# Patient Record
Sex: Male | Born: 1950
Health system: Southern US, Community
[De-identification: ages and names within clinical notes are randomized; demographics above are authoritative.]

## PROBLEM LIST (undated history)

## (undated) DIAGNOSIS — K219 Gastro-esophageal reflux disease without esophagitis: Secondary | ICD-10-CM

## (undated) DIAGNOSIS — G629 Polyneuropathy, unspecified: Secondary | ICD-10-CM

## (undated) DIAGNOSIS — M109 Gout, unspecified: Secondary | ICD-10-CM

## (undated) DIAGNOSIS — C801 Malignant (primary) neoplasm, unspecified: Secondary | ICD-10-CM

## (undated) DIAGNOSIS — F329 Major depressive disorder, single episode, unspecified: Secondary | ICD-10-CM

## (undated) DIAGNOSIS — F32A Depression, unspecified: Secondary | ICD-10-CM

## (undated) DIAGNOSIS — J449 Chronic obstructive pulmonary disease, unspecified: Secondary | ICD-10-CM

## (undated) DIAGNOSIS — M549 Dorsalgia, unspecified: Secondary | ICD-10-CM

## (undated) DIAGNOSIS — H5440 Blindness, one eye, unspecified eye: Secondary | ICD-10-CM

## (undated) DIAGNOSIS — R0600 Dyspnea, unspecified: Secondary | ICD-10-CM

## (undated) DIAGNOSIS — E119 Type 2 diabetes mellitus without complications: Secondary | ICD-10-CM

## (undated) DIAGNOSIS — I10 Essential (primary) hypertension: Secondary | ICD-10-CM

## (undated) DIAGNOSIS — M1990 Unspecified osteoarthritis, unspecified site: Secondary | ICD-10-CM

## (undated) HISTORY — PX: LEG SURGERY: SHX1003

## (undated) HISTORY — PX: EYE SURGERY: SHX253

---

## 2005-02-27 ENCOUNTER — Observation Stay (HOSPITAL_COMMUNITY): Admission: RE | Admit: 2005-02-27 | Discharge: 2005-02-28 | Payer: Self-pay | Admitting: Neurosurgery

## 2015-03-11 ENCOUNTER — Encounter (HOSPITAL_COMMUNITY): Payer: Self-pay | Admitting: Cardiology

## 2015-03-11 ENCOUNTER — Emergency Department (HOSPITAL_COMMUNITY)
Admission: EM | Admit: 2015-03-11 | Discharge: 2015-03-11 | Disposition: A | Discharge status: Home / Self Care | Payer: Medicare Other | Attending: Emergency Medicine | Admitting: Emergency Medicine

## 2015-03-11 DIAGNOSIS — K611 Rectal abscess: Secondary | ICD-10-CM | POA: Insufficient documentation

## 2015-03-11 DIAGNOSIS — Z720 Tobacco use: Secondary | ICD-10-CM | POA: Diagnosis not present

## 2015-03-11 DIAGNOSIS — M1990 Unspecified osteoarthritis, unspecified site: Secondary | ICD-10-CM | POA: Diagnosis not present

## 2015-03-11 DIAGNOSIS — E119 Type 2 diabetes mellitus without complications: Secondary | ICD-10-CM | POA: Diagnosis not present

## 2015-03-11 DIAGNOSIS — Z79899 Other long term (current) drug therapy: Secondary | ICD-10-CM | POA: Diagnosis not present

## 2015-03-11 DIAGNOSIS — Z792 Long term (current) use of antibiotics: Secondary | ICD-10-CM | POA: Diagnosis not present

## 2015-03-11 HISTORY — DX: Dorsalgia, unspecified: M54.9

## 2015-03-11 HISTORY — DX: Unspecified osteoarthritis, unspecified site: M19.90

## 2015-03-11 HISTORY — DX: Gout, unspecified: M10.9

## 2015-03-11 HISTORY — DX: Type 2 diabetes mellitus without complications: E11.9

## 2015-03-11 LAB — CBC WITH DIFFERENTIAL/PLATELET
Basophils Absolute: 0 10*3/uL (ref 0.0–0.1)
Basophils Relative: 0 % (ref 0–1)
Eosinophils Absolute: 0 10*3/uL (ref 0.0–0.7)
Eosinophils Relative: 0 % (ref 0–5)
HCT: 42 % (ref 39.0–52.0)
Hemoglobin: 14.3 g/dL (ref 13.0–17.0)
Lymphocytes Relative: 7 % — ABNORMAL LOW (ref 12–46)
Lymphs Abs: 1.3 10*3/uL (ref 0.7–4.0)
MCH: 31.6 pg (ref 26.0–34.0)
MCHC: 34 g/dL (ref 30.0–36.0)
MCV: 92.7 fL (ref 78.0–100.0)
Monocytes Absolute: 0.8 10*3/uL (ref 0.1–1.0)
Monocytes Relative: 5 % (ref 3–12)
Neutro Abs: 15.5 10*3/uL — ABNORMAL HIGH (ref 1.7–7.7)
Neutrophils Relative %: 88 % — ABNORMAL HIGH (ref 43–77)
Platelets: 311 10*3/uL (ref 150–400)
RBC: 4.53 MIL/uL (ref 4.22–5.81)
RDW: 13 % (ref 11.5–15.5)
WBC: 17.7 10*3/uL — ABNORMAL HIGH (ref 4.0–10.5)

## 2015-03-11 LAB — BASIC METABOLIC PANEL
Anion gap: 10 (ref 5–15)
BUN: 9 mg/dL (ref 6–23)
CO2: 24 mmol/L (ref 19–32)
Calcium: 9.3 mg/dL (ref 8.4–10.5)
Chloride: 100 mmol/L (ref 96–112)
Creatinine, Ser: 0.81 mg/dL (ref 0.50–1.35)
GFR calc Af Amer: >90 mL/min (ref >90)
GFR calc non Af Amer: >90 mL/min (ref >90)
Glucose, Bld: 165 mg/dL — ABNORMAL HIGH (ref 70–99)
Potassium: 3.7 mmol/L (ref 3.5–5.1)
Sodium: 134 mmol/L — ABNORMAL LOW (ref 135–145)

## 2015-03-11 MED ORDER — LIDOCAINE-EPINEPHRINE (PF) 2 %-1:200000 IJ SOLN
INTRAMUSCULAR | Status: AC
  Administered 2015-03-11: 15:00:00
  Filled 2015-03-11: qty 20

## 2015-03-11 MED ORDER — POVIDONE-IODINE 10 % EX SOLN
CUTANEOUS | Status: AC
  Administered 2015-03-11: 15:00:00
  Filled 2015-03-11: qty 118

## 2015-03-11 MED ORDER — HYDROCODONE-ACETAMINOPHEN 5-325 MG PO TABS: 1.0000 | ORAL_TABLET | ORAL | Status: DC | PRN

## 2015-03-11 MED ORDER — METRONIDAZOLE 500 MG PO TABS: 500.0000 mg | ORAL_TABLET | Freq: Two times a day (BID) | ORAL | Status: DC

## 2015-03-11 MED ORDER — CIPROFLOXACIN HCL 500 MG PO TABS: 500.0000 mg | ORAL_TABLET | Freq: Two times a day (BID) | ORAL | Status: DC

## 2015-03-11 NOTE — ED Notes (Signed)
Abscess on bottom times one week.

## 2015-03-11 NOTE — Discharge Instructions (Signed)
It is important that you follow up with Dr. Arnoldo Morale at 10 am on Tuesday for further evaluation. Take the medication as directed. Do not take the narcotic if driving as it will make you sleepy.   Peri-Rectal Abscess Your caregiver has diagnosed you as having a peri-rectal abscess. This is an infected area near the rectum that is filled with pus. If the abscess is near the surface of the skin, your caregiver may open (incise) the area and drain the pus. HOME CARE INSTRUCTIONS   If your abscess was opened up and drained. A small piece of gauze may be placed in the opening so that it can drain. Do not remove the gauze unless directed by your caregiver.  A loose dressing may be placed over the abscess site. Change the dressing as often as necessary to keep it clean and dry.  After the drain is removed, the area may be washed with a gentle antiseptic (soap) four times per day.  A warm sitz bath, warm packs or heating pad may be used for pain relief, taking care not to burn yourself.  Return for a wound check in 1 day or as directed.  An "inflatable doughnut" may be used for sitting with added comfort. These can be purchased at a drugstore or medical supply house.  To reduce pain and straining with bowel movements, eat a high fiber diet with plenty of fruits and vegetables. Use stool softeners as recommended by your caregiver. This is especially important if narcotic type pain medications were prescribed as these may cause marked constipation.  Only take over-the-counter or prescription medicines for pain, discomfort, or fever as directed by your caregiver. SEEK IMMEDIATE MEDICAL CARE IF:   You have increasing pain that is not controlled by medication.  There is increased inflammation (redness), swelling, bleeding, or drainage from the area.  An oral temperature above 102 F (38.9 C) develops.  You develop chills or generalized malaise (feel lethargic or feel "washed out").  You develop any  new symptoms (problems) you feel may be related to your present problem. Document Released: 11/21/2000 Document Revised: 02/16/2012 Document Reviewed: 11/21/2008 [facility] Patient Information 2015 [place], [place]. This information is not intended to replace advice given to you by your health care provider. Make sure you discuss any questions you have with your health care provider.

## 2015-03-11 NOTE — ED Provider Notes (Signed)
CSN: [id number]     Arrival date & time 03/11/15  1138 History   This chart was scribed for non-physician practitioner Debroah Baller working with Nat Christen, MD by Hilda Lias, ED Scribe. This patient was seen in room APFT20/APFT20 and the patient's care was started at 12:57 PM.    Chief Complaint  Patient presents with  . Abscess      Patient is a 64 y.o. male presenting with abscess. The history is provided by the patient. No language interpreter was used.  Abscess Location:  Ano-genital Ano-genital abscess location:  Anus Abscess quality: fluctuance, painful, redness and warmth   Red streaking: no   Duration:  1 week Progression:  Worsening Pain details:    Quality:  Throbbing and burning   Severity:  Severe   Timing:  Constant   Progression:  Worsening Chronicity:  New Context: diabetes   Relieved by:  Nothing Worsened by:  Nothing tried Ineffective treatments:  NSAIDs, topical antibiotics, warm water soaks and oral antibiotics     HPI Comments: Lance [name] is a 64 y.o. male who presents to the Emergency Department complaining of a worsening abscess on his buttocks with associated redness and purulent drainage that has been present for a week and a half. Pt states that he had diarrhea a few weeks ago. He went to his PCP and was treated for viral gastroenteritis. Those symptoms resolved but then he noticed a red tender area on his buttocks near his rectum. There area has continued to get larger and more painful. He went back to his PCP and was treated with Penicillin and topical antibiotic. The area has gotten worse rather than better. Pt states he soaks it in warm tubs of water a few times per day with no relief. Pt notes he has not had a bowel movement in 4 days due to the pain.    Past Medical History  Diagnosis Date  . Gout   . Back pain   . Diabetes mellitus without complication   . Arthritis    Past Surgical History  Procedure Laterality Date  . Leg surgery    .  Eye surgery     History reviewed. No pertinent family history. History  Substance Use Topics  . Smoking status: Current Every Day Smoker  . Smokeless tobacco: Not on file  . Alcohol Use: Yes     Comment: occasional     Review of Systems  Skin:       Abscess near rectum.    All other systems negative   Allergies  Review of patient's allergies indicates no known allergies.  Home Medications   Prior to Admission medications   Medication Sig Start Date End Date Taking? Authorizing Provider  ciprofloxacin (CIPRO) 500 MG tablet Take 1 tablet (500 mg total) by mouth 2 (two) times daily. 03/11/15   Hope Bunnie Pion, NP  HYDROcodone-acetaminophen (NORCO/VICODIN) 5-325 MG per tablet Take 1 tablet by mouth every 4 (four) hours as needed. 03/11/15   Hope Bunnie Pion, NP  metroNIDAZOLE (FLAGYL) 500 MG tablet Take 1 tablet (500 mg total) by mouth 2 (two) times daily. 03/11/15   Hope Bunnie Pion, NP   BP 158/99 mmHg  Pulse 102  Temp(Src) 97.7 F (36.5 C) (Oral)  Resp 20  Ht 6' 2.5" (1.892 m)  Wt 205 lb (92.987 kg)  BMI 25.98 kg/m2  SpO2 100% Physical Exam  Constitutional: He is oriented to person, place, and time. He appears well-developed and well-nourished.  HENT:  Head: Normocephalic and atraumatic.  Neck: Normal range of motion. Neck supple.  Cardiovascular: Normal rate.   Pulmonary/Chest: Effort normal.  Genitourinary: Rectal exam shows tenderness. Rectal exam shows anal tone normal.     Tender, raised, red, fluctuant area to the left buttock at the fold just next to the anus.    Neurological: He is alert and oriented to person, place, and time.  Skin: Skin is warm and dry. There is erythema.  Tenderness, swelling, erythema to peri-rectal area  Psychiatric: He has a normal mood and affect. His behavior is normal.  Nursing note and vitals reviewed.   ED Course  INCISION AND DRAINAGE Date/Time: 03/11/2015 2:42 PM Performed by: Ashley Murrain Authorized by: Ashley Murrain Consent: Verbal  consent obtained. Risks and benefits: risks, benefits and alternatives were discussed Consent given by: patient Patient understanding: patient states understanding of the procedure being performed Required items: required blood products, implants, devices, and special equipment available Patient identity confirmed: verbally with patient Type: abscess Body area: anogenital Location details: perianal Anesthesia: local infiltration Local anesthetic: lidocaine 1% with epinephrine Anesthetic total: 4 ml Patient sedated: no Scalpel size: 11 Needle gauge: 20 Incision type: single straight Complexity: simple Drainage: purulent Drainage amount: copious Packing material: 1/4 in iodoform gauze Patient tolerance: Patient tolerated the procedure well with no immediate complications   Results for orders placed or performed during the hospital encounter of 03/11/15 (from the past 24 hour(s))  CBC with Differential/Platelet     Status: Abnormal   Collection Time: 03/11/15  1:39 PM  Result Value Ref Range   WBC 17.7 (H) 4.0 - 10.5 K/uL   RBC 4.53 4.22 - 5.81 MIL/uL   Hemoglobin 14.3 13.0 - 17.0 g/dL   HCT 42.0 39.0 - 52.0 %   MCV 92.7 78.0 - 100.0 fL   MCH 31.6 26.0 - 34.0 pg   MCHC 34.0 30.0 - 36.0 g/dL   RDW 13.0 11.5 - 15.5 %   Platelets 311 150 - 400 K/uL   Neutrophils Relative % 88 (H) 43 - 77 %   Neutro Abs 15.5 (H) 1.7 - 7.7 K/uL   Lymphocytes Relative 7 (L) 12 - 46 %   Lymphs Abs 1.3 0.7 - 4.0 K/uL   Monocytes Relative 5 3 - 12 %   Monocytes Absolute 0.8 0.1 - 1.0 K/uL   Eosinophils Relative 0 0 - 5 %   Eosinophils Absolute 0.0 0.0 - 0.7 K/uL   Basophils Relative 0 0 - 1 %   Basophils Absolute 0.0 0.0 - 0.1 K/uL  Basic metabolic panel     Status: Abnormal   Collection Time: 03/11/15  1:39 PM  Result Value Ref Range   Sodium 134 (L) 135 - 145 mmol/L   Potassium 3.7 3.5 - 5.1 mmol/L   Chloride 100 96 - 112 mmol/L   CO2 24 19 - 32 mmol/L   Glucose, Bld 165 (H) 70 - 99 mg/dL    BUN 9 6 - 23 mg/dL   Creatinine, Ser 0.81 0.50 - 1.35 mg/dL   Calcium 9.3 8.4 - 10.5 mg/dL   GFR calc non Af Amer >90 >90 mL/min   GFR calc Af Amer >90 >90 mL/min   Anion gap 10 5 - 15    Consult with Dr. Arnoldo Morale. Patient to follow up in the office in 2 days.    DIAGNOSTIC STUDIES: Oxygen Saturation is 100% on room air, normal by my interpretation.    COORDINATION OF CARE: 1:01 PM Discussed treatment  plan with pt at bedside and pt agreed to plan.    MDM  64 y.o. male with peri rectal abscess. Patient states he is feeling much better after I&D. Will treat with Cipro and Flagyl and Hydrocodone, he will follow up with Dr. Arnoldo Morale in 2 days. He will return here sooner for any problems.   Final diagnoses:  Perirectal abscess   I personally performed the services described in this documentation, which was scribed in my presence. The recorded information has been reviewed and is accurate.    [address] [place], NP 03/11/15 [place], MD 03/13/15 [phone]

## 2015-12-19 DIAGNOSIS — M47817 Spondylosis without myelopathy or radiculopathy, lumbosacral region: Secondary | ICD-10-CM | POA: Diagnosis not present

## 2015-12-19 DIAGNOSIS — M4316 Spondylolisthesis, lumbar region: Secondary | ICD-10-CM | POA: Diagnosis not present

## 2015-12-19 DIAGNOSIS — M5136 Other intervertebral disc degeneration, lumbar region: Secondary | ICD-10-CM | POA: Diagnosis not present

## 2015-12-19 DIAGNOSIS — E1142 Type 2 diabetes mellitus with diabetic polyneuropathy: Secondary | ICD-10-CM | POA: Diagnosis not present

## 2015-12-28 DIAGNOSIS — Z720 Tobacco use: Secondary | ICD-10-CM | POA: Diagnosis not present

## 2015-12-28 DIAGNOSIS — F17200 Nicotine dependence, unspecified, uncomplicated: Secondary | ICD-10-CM | POA: Diagnosis not present

## 2015-12-28 DIAGNOSIS — M545 Low back pain: Secondary | ICD-10-CM | POA: Diagnosis not present

## 2015-12-28 DIAGNOSIS — F17210 Nicotine dependence, cigarettes, uncomplicated: Secondary | ICD-10-CM | POA: Diagnosis not present

## 2015-12-28 DIAGNOSIS — Z6827 Body mass index (BMI) 27.0-27.9, adult: Secondary | ICD-10-CM | POA: Diagnosis not present

## 2016-01-09 DIAGNOSIS — E1142 Type 2 diabetes mellitus with diabetic polyneuropathy: Secondary | ICD-10-CM | POA: Diagnosis not present

## 2016-01-09 DIAGNOSIS — M4316 Spondylolisthesis, lumbar region: Secondary | ICD-10-CM | POA: Diagnosis not present

## 2016-01-09 DIAGNOSIS — M5136 Other intervertebral disc degeneration, lumbar region: Secondary | ICD-10-CM | POA: Diagnosis not present

## 2016-01-09 DIAGNOSIS — I10 Essential (primary) hypertension: Secondary | ICD-10-CM | POA: Diagnosis not present

## 2016-01-09 DIAGNOSIS — M47817 Spondylosis without myelopathy or radiculopathy, lumbosacral region: Secondary | ICD-10-CM | POA: Diagnosis not present

## 2016-01-09 DIAGNOSIS — M47816 Spondylosis without myelopathy or radiculopathy, lumbar region: Secondary | ICD-10-CM | POA: Diagnosis not present

## 2016-01-09 DIAGNOSIS — Z981 Arthrodesis status: Secondary | ICD-10-CM | POA: Diagnosis not present

## 2016-01-09 DIAGNOSIS — M1A09X1 Idiopathic chronic gout, multiple sites, with tophus (tophi): Secondary | ICD-10-CM | POA: Diagnosis not present

## 2016-01-09 DIAGNOSIS — E785 Hyperlipidemia, unspecified: Secondary | ICD-10-CM | POA: Diagnosis not present

## 2016-01-09 DIAGNOSIS — F17210 Nicotine dependence, cigarettes, uncomplicated: Secondary | ICD-10-CM | POA: Diagnosis not present

## 2016-01-09 DIAGNOSIS — Z79899 Other long term (current) drug therapy: Secondary | ICD-10-CM | POA: Diagnosis not present

## 2016-01-09 DIAGNOSIS — Z7984 Long term (current) use of oral hypoglycemic drugs: Secondary | ICD-10-CM | POA: Diagnosis not present

## 2016-01-09 DIAGNOSIS — K219 Gastro-esophageal reflux disease without esophagitis: Secondary | ICD-10-CM | POA: Diagnosis not present

## 2016-02-06 DIAGNOSIS — M5136 Other intervertebral disc degeneration, lumbar region: Secondary | ICD-10-CM | POA: Diagnosis not present

## 2016-02-06 DIAGNOSIS — E1142 Type 2 diabetes mellitus with diabetic polyneuropathy: Secondary | ICD-10-CM | POA: Diagnosis not present

## 2016-02-06 DIAGNOSIS — M47817 Spondylosis without myelopathy or radiculopathy, lumbosacral region: Secondary | ICD-10-CM | POA: Diagnosis not present

## 2016-02-06 DIAGNOSIS — I10 Essential (primary) hypertension: Secondary | ICD-10-CM | POA: Diagnosis not present

## 2016-02-06 DIAGNOSIS — E7800 Pure hypercholesterolemia, unspecified: Secondary | ICD-10-CM | POA: Diagnosis not present

## 2016-02-06 DIAGNOSIS — M4316 Spondylolisthesis, lumbar region: Secondary | ICD-10-CM | POA: Diagnosis not present

## 2016-03-20 DIAGNOSIS — E7800 Pure hypercholesterolemia, unspecified: Secondary | ICD-10-CM | POA: Diagnosis not present

## 2016-03-20 DIAGNOSIS — Z6826 Body mass index (BMI) 26.0-26.9, adult: Secondary | ICD-10-CM | POA: Diagnosis not present

## 2016-03-20 DIAGNOSIS — Z7189 Other specified counseling: Secondary | ICD-10-CM | POA: Diagnosis not present

## 2016-03-20 DIAGNOSIS — R5383 Other fatigue: Secondary | ICD-10-CM | POA: Diagnosis not present

## 2016-03-20 DIAGNOSIS — Z0000 Encounter for general adult medical examination without abnormal findings: Secondary | ICD-10-CM | POA: Diagnosis not present

## 2016-03-20 DIAGNOSIS — E1142 Type 2 diabetes mellitus with diabetic polyneuropathy: Secondary | ICD-10-CM | POA: Diagnosis not present

## 2016-03-20 DIAGNOSIS — Z299 Encounter for prophylactic measures, unspecified: Secondary | ICD-10-CM | POA: Diagnosis not present

## 2016-03-20 DIAGNOSIS — Z1389 Encounter for screening for other disorder: Secondary | ICD-10-CM | POA: Diagnosis not present

## 2016-03-20 DIAGNOSIS — Z125 Encounter for screening for malignant neoplasm of prostate: Secondary | ICD-10-CM | POA: Diagnosis not present

## 2016-04-02 DIAGNOSIS — E7800 Pure hypercholesterolemia, unspecified: Secondary | ICD-10-CM | POA: Diagnosis not present

## 2016-04-02 DIAGNOSIS — E119 Type 2 diabetes mellitus without complications: Secondary | ICD-10-CM | POA: Diagnosis not present

## 2016-04-02 DIAGNOSIS — J449 Chronic obstructive pulmonary disease, unspecified: Secondary | ICD-10-CM | POA: Diagnosis not present

## 2016-05-15 DIAGNOSIS — E7800 Pure hypercholesterolemia, unspecified: Secondary | ICD-10-CM | POA: Diagnosis not present

## 2016-05-15 DIAGNOSIS — I10 Essential (primary) hypertension: Secondary | ICD-10-CM | POA: Diagnosis not present

## 2016-05-15 DIAGNOSIS — E1142 Type 2 diabetes mellitus with diabetic polyneuropathy: Secondary | ICD-10-CM | POA: Diagnosis not present

## 2016-05-15 DIAGNOSIS — M545 Low back pain: Secondary | ICD-10-CM | POA: Diagnosis not present

## 2016-05-28 DIAGNOSIS — J449 Chronic obstructive pulmonary disease, unspecified: Secondary | ICD-10-CM | POA: Diagnosis not present

## 2016-05-28 DIAGNOSIS — E7800 Pure hypercholesterolemia, unspecified: Secondary | ICD-10-CM | POA: Diagnosis not present

## 2016-05-28 DIAGNOSIS — E119 Type 2 diabetes mellitus without complications: Secondary | ICD-10-CM | POA: Diagnosis not present

## 2016-06-03 DIAGNOSIS — J449 Chronic obstructive pulmonary disease, unspecified: Secondary | ICD-10-CM | POA: Diagnosis not present

## 2016-06-03 DIAGNOSIS — F17200 Nicotine dependence, unspecified, uncomplicated: Secondary | ICD-10-CM | POA: Diagnosis not present

## 2016-06-03 DIAGNOSIS — I10 Essential (primary) hypertension: Secondary | ICD-10-CM | POA: Diagnosis not present

## 2016-06-03 DIAGNOSIS — Z720 Tobacco use: Secondary | ICD-10-CM | POA: Diagnosis not present

## 2016-06-03 DIAGNOSIS — E7800 Pure hypercholesterolemia, unspecified: Secondary | ICD-10-CM | POA: Diagnosis not present

## 2016-06-12 DIAGNOSIS — H52221 Regular astigmatism, right eye: Secondary | ICD-10-CM | POA: Diagnosis not present

## 2016-06-12 DIAGNOSIS — H5211 Myopia, right eye: Secondary | ICD-10-CM | POA: Diagnosis not present

## 2016-06-12 DIAGNOSIS — E119 Type 2 diabetes mellitus without complications: Secondary | ICD-10-CM | POA: Diagnosis not present

## 2016-06-12 DIAGNOSIS — Z7984 Long term (current) use of oral hypoglycemic drugs: Secondary | ICD-10-CM | POA: Diagnosis not present

## 2016-06-12 DIAGNOSIS — H2511 Age-related nuclear cataract, right eye: Secondary | ICD-10-CM | POA: Diagnosis not present

## 2016-06-12 DIAGNOSIS — H04123 Dry eye syndrome of bilateral lacrimal glands: Secondary | ICD-10-CM | POA: Diagnosis not present

## 2016-06-30 DIAGNOSIS — Z79899 Other long term (current) drug therapy: Secondary | ICD-10-CM | POA: Diagnosis not present

## 2016-06-30 DIAGNOSIS — G629 Polyneuropathy, unspecified: Secondary | ICD-10-CM | POA: Diagnosis not present

## 2016-06-30 DIAGNOSIS — G8929 Other chronic pain: Secondary | ICD-10-CM | POA: Diagnosis not present

## 2016-06-30 DIAGNOSIS — M47816 Spondylosis without myelopathy or radiculopathy, lumbar region: Secondary | ICD-10-CM | POA: Diagnosis not present

## 2016-08-15 DIAGNOSIS — J449 Chronic obstructive pulmonary disease, unspecified: Secondary | ICD-10-CM | POA: Diagnosis not present

## 2016-08-15 DIAGNOSIS — Z720 Tobacco use: Secondary | ICD-10-CM | POA: Diagnosis not present

## 2016-08-15 DIAGNOSIS — E7800 Pure hypercholesterolemia, unspecified: Secondary | ICD-10-CM | POA: Diagnosis not present

## 2016-08-15 DIAGNOSIS — F17200 Nicotine dependence, unspecified, uncomplicated: Secondary | ICD-10-CM | POA: Diagnosis not present

## 2016-08-15 DIAGNOSIS — E1142 Type 2 diabetes mellitus with diabetic polyneuropathy: Secondary | ICD-10-CM | POA: Diagnosis not present

## 2016-08-27 DIAGNOSIS — Z79891 Long term (current) use of opiate analgesic: Secondary | ICD-10-CM | POA: Diagnosis not present

## 2016-08-27 DIAGNOSIS — G8929 Other chronic pain: Secondary | ICD-10-CM | POA: Diagnosis not present

## 2016-08-27 DIAGNOSIS — M47816 Spondylosis without myelopathy or radiculopathy, lumbar region: Secondary | ICD-10-CM | POA: Diagnosis not present

## 2016-08-27 DIAGNOSIS — G629 Polyneuropathy, unspecified: Secondary | ICD-10-CM | POA: Diagnosis not present

## 2016-10-01 DIAGNOSIS — Z23 Encounter for immunization: Secondary | ICD-10-CM | POA: Diagnosis not present

## 2016-10-06 DIAGNOSIS — M5136 Other intervertebral disc degeneration, lumbar region: Secondary | ICD-10-CM | POA: Diagnosis not present

## 2016-10-06 DIAGNOSIS — M4727 Other spondylosis with radiculopathy, lumbosacral region: Secondary | ICD-10-CM | POA: Diagnosis not present

## 2016-10-08 DIAGNOSIS — G8929 Other chronic pain: Secondary | ICD-10-CM | POA: Diagnosis not present

## 2016-10-08 DIAGNOSIS — G629 Polyneuropathy, unspecified: Secondary | ICD-10-CM | POA: Diagnosis not present

## 2016-10-08 DIAGNOSIS — M47816 Spondylosis without myelopathy or radiculopathy, lumbar region: Secondary | ICD-10-CM | POA: Diagnosis not present

## 2016-10-08 DIAGNOSIS — Z79891 Long term (current) use of opiate analgesic: Secondary | ICD-10-CM | POA: Diagnosis not present

## 2016-11-14 DIAGNOSIS — E1142 Type 2 diabetes mellitus with diabetic polyneuropathy: Secondary | ICD-10-CM | POA: Diagnosis not present

## 2016-11-14 DIAGNOSIS — I10 Essential (primary) hypertension: Secondary | ICD-10-CM | POA: Diagnosis not present

## 2016-11-14 DIAGNOSIS — Z299 Encounter for prophylactic measures, unspecified: Secondary | ICD-10-CM | POA: Diagnosis not present

## 2016-11-14 DIAGNOSIS — J449 Chronic obstructive pulmonary disease, unspecified: Secondary | ICD-10-CM | POA: Diagnosis not present

## 2016-11-14 DIAGNOSIS — M545 Low back pain: Secondary | ICD-10-CM | POA: Diagnosis not present

## 2016-11-14 DIAGNOSIS — Z6827 Body mass index (BMI) 27.0-27.9, adult: Secondary | ICD-10-CM | POA: Diagnosis not present

## 2016-11-17 DIAGNOSIS — J449 Chronic obstructive pulmonary disease, unspecified: Secondary | ICD-10-CM | POA: Diagnosis not present

## 2016-11-17 DIAGNOSIS — E119 Type 2 diabetes mellitus without complications: Secondary | ICD-10-CM | POA: Diagnosis not present

## 2016-11-17 DIAGNOSIS — E7800 Pure hypercholesterolemia, unspecified: Secondary | ICD-10-CM | POA: Diagnosis not present

## 2016-11-26 DIAGNOSIS — G8929 Other chronic pain: Secondary | ICD-10-CM | POA: Diagnosis not present

## 2016-11-26 DIAGNOSIS — Z79891 Long term (current) use of opiate analgesic: Secondary | ICD-10-CM | POA: Diagnosis not present

## 2016-11-26 DIAGNOSIS — G629 Polyneuropathy, unspecified: Secondary | ICD-10-CM | POA: Diagnosis not present

## 2016-11-26 DIAGNOSIS — M47816 Spondylosis without myelopathy or radiculopathy, lumbar region: Secondary | ICD-10-CM | POA: Diagnosis not present

## 2016-12-23 DIAGNOSIS — E7800 Pure hypercholesterolemia, unspecified: Secondary | ICD-10-CM | POA: Diagnosis not present

## 2016-12-23 DIAGNOSIS — E119 Type 2 diabetes mellitus without complications: Secondary | ICD-10-CM | POA: Diagnosis not present

## 2016-12-23 DIAGNOSIS — J449 Chronic obstructive pulmonary disease, unspecified: Secondary | ICD-10-CM | POA: Diagnosis not present

## 2017-01-26 DIAGNOSIS — M160 Bilateral primary osteoarthritis of hip: Secondary | ICD-10-CM | POA: Diagnosis not present

## 2017-01-26 DIAGNOSIS — M1611 Unilateral primary osteoarthritis, right hip: Secondary | ICD-10-CM | POA: Diagnosis not present

## 2017-01-26 DIAGNOSIS — M47816 Spondylosis without myelopathy or radiculopathy, lumbar region: Secondary | ICD-10-CM | POA: Diagnosis not present

## 2017-01-26 DIAGNOSIS — I878 Other specified disorders of veins: Secondary | ICD-10-CM | POA: Diagnosis not present

## 2017-01-26 DIAGNOSIS — M4317 Spondylolisthesis, lumbosacral region: Secondary | ICD-10-CM | POA: Diagnosis not present

## 2017-02-03 DIAGNOSIS — Z79891 Long term (current) use of opiate analgesic: Secondary | ICD-10-CM | POA: Diagnosis not present

## 2017-02-03 DIAGNOSIS — M2550 Pain in unspecified joint: Secondary | ICD-10-CM | POA: Diagnosis not present

## 2017-02-03 DIAGNOSIS — M47817 Spondylosis without myelopathy or radiculopathy, lumbosacral region: Secondary | ICD-10-CM | POA: Diagnosis not present

## 2017-02-03 DIAGNOSIS — G894 Chronic pain syndrome: Secondary | ICD-10-CM | POA: Diagnosis not present

## 2017-02-03 DIAGNOSIS — M170 Bilateral primary osteoarthritis of knee: Secondary | ICD-10-CM | POA: Diagnosis not present

## 2017-02-03 DIAGNOSIS — G629 Polyneuropathy, unspecified: Secondary | ICD-10-CM | POA: Diagnosis not present

## 2017-02-23 DIAGNOSIS — F17200 Nicotine dependence, unspecified, uncomplicated: Secondary | ICD-10-CM | POA: Diagnosis not present

## 2017-02-23 DIAGNOSIS — M2550 Pain in unspecified joint: Secondary | ICD-10-CM | POA: Diagnosis not present

## 2017-02-23 DIAGNOSIS — M170 Bilateral primary osteoarthritis of knee: Secondary | ICD-10-CM | POA: Diagnosis not present

## 2017-02-23 DIAGNOSIS — G894 Chronic pain syndrome: Secondary | ICD-10-CM | POA: Diagnosis not present

## 2017-03-04 DIAGNOSIS — E7800 Pure hypercholesterolemia, unspecified: Secondary | ICD-10-CM | POA: Diagnosis not present

## 2017-03-04 DIAGNOSIS — J449 Chronic obstructive pulmonary disease, unspecified: Secondary | ICD-10-CM | POA: Diagnosis not present

## 2017-03-04 DIAGNOSIS — E119 Type 2 diabetes mellitus without complications: Secondary | ICD-10-CM | POA: Diagnosis not present

## 2017-03-30 DIAGNOSIS — M170 Bilateral primary osteoarthritis of knee: Secondary | ICD-10-CM | POA: Diagnosis not present

## 2017-03-30 DIAGNOSIS — G894 Chronic pain syndrome: Secondary | ICD-10-CM | POA: Diagnosis not present

## 2017-03-30 DIAGNOSIS — Z79891 Long term (current) use of opiate analgesic: Secondary | ICD-10-CM | POA: Diagnosis not present

## 2017-03-30 DIAGNOSIS — M47817 Spondylosis without myelopathy or radiculopathy, lumbosacral region: Secondary | ICD-10-CM | POA: Diagnosis not present

## 2017-04-03 DIAGNOSIS — E7800 Pure hypercholesterolemia, unspecified: Secondary | ICD-10-CM | POA: Diagnosis not present

## 2017-04-03 DIAGNOSIS — E119 Type 2 diabetes mellitus without complications: Secondary | ICD-10-CM | POA: Diagnosis not present

## 2017-04-03 DIAGNOSIS — E11319 Type 2 diabetes mellitus with unspecified diabetic retinopathy without macular edema: Secondary | ICD-10-CM | POA: Diagnosis not present

## 2017-04-03 DIAGNOSIS — J449 Chronic obstructive pulmonary disease, unspecified: Secondary | ICD-10-CM | POA: Diagnosis not present

## 2017-04-08 DIAGNOSIS — G8929 Other chronic pain: Secondary | ICD-10-CM | POA: Diagnosis not present

## 2017-04-08 DIAGNOSIS — I10 Essential (primary) hypertension: Secondary | ICD-10-CM | POA: Diagnosis not present

## 2017-04-08 DIAGNOSIS — K219 Gastro-esophageal reflux disease without esophagitis: Secondary | ICD-10-CM | POA: Diagnosis not present

## 2017-04-08 DIAGNOSIS — E1165 Type 2 diabetes mellitus with hyperglycemia: Secondary | ICD-10-CM | POA: Diagnosis not present

## 2017-04-08 DIAGNOSIS — Z6826 Body mass index (BMI) 26.0-26.9, adult: Secondary | ICD-10-CM | POA: Diagnosis not present

## 2017-04-08 DIAGNOSIS — E1142 Type 2 diabetes mellitus with diabetic polyneuropathy: Secondary | ICD-10-CM | POA: Diagnosis not present

## 2017-04-08 DIAGNOSIS — E7800 Pure hypercholesterolemia, unspecified: Secondary | ICD-10-CM | POA: Diagnosis not present

## 2017-04-08 DIAGNOSIS — J449 Chronic obstructive pulmonary disease, unspecified: Secondary | ICD-10-CM | POA: Diagnosis not present

## 2017-04-08 DIAGNOSIS — Z299 Encounter for prophylactic measures, unspecified: Secondary | ICD-10-CM | POA: Diagnosis not present

## 2017-04-30 DIAGNOSIS — H2511 Age-related nuclear cataract, right eye: Secondary | ICD-10-CM | POA: Diagnosis not present

## 2017-04-30 DIAGNOSIS — H40013 Open angle with borderline findings, low risk, bilateral: Secondary | ICD-10-CM | POA: Diagnosis not present

## 2017-04-30 DIAGNOSIS — H353111 Nonexudative age-related macular degeneration, right eye, early dry stage: Secondary | ICD-10-CM | POA: Diagnosis not present

## 2017-04-30 DIAGNOSIS — H25011 Cortical age-related cataract, right eye: Secondary | ICD-10-CM | POA: Diagnosis not present

## 2017-05-01 DIAGNOSIS — E119 Type 2 diabetes mellitus without complications: Secondary | ICD-10-CM | POA: Diagnosis not present

## 2017-05-01 DIAGNOSIS — E7800 Pure hypercholesterolemia, unspecified: Secondary | ICD-10-CM | POA: Diagnosis not present

## 2017-05-01 DIAGNOSIS — J449 Chronic obstructive pulmonary disease, unspecified: Secondary | ICD-10-CM | POA: Diagnosis not present

## 2017-05-07 DIAGNOSIS — Z299 Encounter for prophylactic measures, unspecified: Secondary | ICD-10-CM | POA: Diagnosis not present

## 2017-05-07 DIAGNOSIS — J449 Chronic obstructive pulmonary disease, unspecified: Secondary | ICD-10-CM | POA: Diagnosis not present

## 2017-05-07 DIAGNOSIS — E663 Overweight: Secondary | ICD-10-CM | POA: Diagnosis not present

## 2017-05-07 DIAGNOSIS — E1142 Type 2 diabetes mellitus with diabetic polyneuropathy: Secondary | ICD-10-CM | POA: Diagnosis not present

## 2017-05-07 DIAGNOSIS — F17210 Nicotine dependence, cigarettes, uncomplicated: Secondary | ICD-10-CM | POA: Diagnosis not present

## 2017-05-07 DIAGNOSIS — E1165 Type 2 diabetes mellitus with hyperglycemia: Secondary | ICD-10-CM | POA: Diagnosis not present

## 2017-05-07 DIAGNOSIS — J441 Chronic obstructive pulmonary disease with (acute) exacerbation: Secondary | ICD-10-CM | POA: Diagnosis not present

## 2017-05-12 DIAGNOSIS — H20011 Primary iridocyclitis, right eye: Secondary | ICD-10-CM | POA: Diagnosis not present

## 2017-05-12 DIAGNOSIS — H2511 Age-related nuclear cataract, right eye: Secondary | ICD-10-CM | POA: Diagnosis not present

## 2017-05-12 DIAGNOSIS — H25811 Combined forms of age-related cataract, right eye: Secondary | ICD-10-CM | POA: Diagnosis not present

## 2017-05-12 DIAGNOSIS — Z9841 Cataract extraction status, right eye: Secondary | ICD-10-CM | POA: Diagnosis not present

## 2017-05-19 DIAGNOSIS — H2511 Age-related nuclear cataract, right eye: Secondary | ICD-10-CM | POA: Diagnosis not present

## 2017-05-19 DIAGNOSIS — Z9841 Cataract extraction status, right eye: Secondary | ICD-10-CM | POA: Diagnosis not present

## 2017-05-19 DIAGNOSIS — H20011 Primary iridocyclitis, right eye: Secondary | ICD-10-CM | POA: Diagnosis not present

## 2017-05-26 DIAGNOSIS — E7800 Pure hypercholesterolemia, unspecified: Secondary | ICD-10-CM | POA: Diagnosis not present

## 2017-05-26 DIAGNOSIS — E119 Type 2 diabetes mellitus without complications: Secondary | ICD-10-CM | POA: Diagnosis not present

## 2017-05-26 DIAGNOSIS — M170 Bilateral primary osteoarthritis of knee: Secondary | ICD-10-CM | POA: Diagnosis not present

## 2017-05-26 DIAGNOSIS — G8929 Other chronic pain: Secondary | ICD-10-CM | POA: Diagnosis not present

## 2017-05-26 DIAGNOSIS — J449 Chronic obstructive pulmonary disease, unspecified: Secondary | ICD-10-CM | POA: Diagnosis not present

## 2017-05-26 DIAGNOSIS — F17200 Nicotine dependence, unspecified, uncomplicated: Secondary | ICD-10-CM | POA: Diagnosis not present

## 2017-05-26 DIAGNOSIS — M47816 Spondylosis without myelopathy or radiculopathy, lumbar region: Secondary | ICD-10-CM | POA: Diagnosis not present

## 2017-06-03 ENCOUNTER — Encounter (HOSPITAL_COMMUNITY): Payer: Self-pay | Admitting: Emergency Medicine

## 2017-06-03 ENCOUNTER — Emergency Department (HOSPITAL_COMMUNITY): Payer: Medicare Other

## 2017-06-03 ENCOUNTER — Emergency Department (HOSPITAL_COMMUNITY)
Admission: EM | Admit: 2017-06-03 | Discharge: 2017-06-03 | Disposition: A | Discharge status: Home Health | Payer: Medicare Other | Attending: Emergency Medicine | Admitting: Emergency Medicine

## 2017-06-03 DIAGNOSIS — K611 Rectal abscess: Secondary | ICD-10-CM | POA: Insufficient documentation

## 2017-06-03 DIAGNOSIS — L0291 Cutaneous abscess, unspecified: Secondary | ICD-10-CM | POA: Diagnosis not present

## 2017-06-03 DIAGNOSIS — K610 Anal abscess: Secondary | ICD-10-CM | POA: Diagnosis not present

## 2017-06-03 DIAGNOSIS — E119 Type 2 diabetes mellitus without complications: Secondary | ICD-10-CM | POA: Insufficient documentation

## 2017-06-03 DIAGNOSIS — F17200 Nicotine dependence, unspecified, uncomplicated: Secondary | ICD-10-CM | POA: Insufficient documentation

## 2017-06-03 LAB — CBC WITH DIFFERENTIAL/PLATELET
Basophils Absolute: 0 10*3/uL (ref 0.0–0.1)
Basophils Relative: 0 %
Eosinophils Absolute: 0.2 10*3/uL (ref 0.0–0.7)
Eosinophils Relative: 2 %
HCT: 40.7 % (ref 39.0–52.0)
Hemoglobin: 13.7 g/dL (ref 13.0–17.0)
Lymphocytes Relative: 14 %
Lymphs Abs: 1.4 10*3/uL (ref 0.7–4.0)
MCH: 30.6 pg (ref 26.0–34.0)
MCHC: 33.7 g/dL (ref 30.0–36.0)
MCV: 91.1 fL (ref 78.0–100.0)
Monocytes Absolute: 0.7 10*3/uL (ref 0.1–1.0)
Monocytes Relative: 7 %
Neutro Abs: 7.5 10*3/uL (ref 1.7–7.7)
Neutrophils Relative %: 77 %
Platelets: 269 10*3/uL (ref 150–400)
RBC: 4.47 MIL/uL (ref 4.22–5.81)
RDW: 13.4 % (ref 11.5–15.5)
WBC: 9.8 10*3/uL (ref 4.0–10.5)

## 2017-06-03 LAB — COMPREHENSIVE METABOLIC PANEL
ALT: 15 U/L — ABNORMAL LOW (ref 17–63)
AST: 15 U/L (ref 15–41)
Albumin: 4 g/dL (ref 3.5–5.0)
Alkaline Phosphatase: 64 U/L (ref 38–126)
Anion gap: 9 (ref 5–15)
BUN: 7 mg/dL (ref 6–20)
CO2: 26 mmol/L (ref 22–32)
Calcium: 9.3 mg/dL (ref 8.9–10.3)
Chloride: 100 mmol/L — ABNORMAL LOW (ref 101–111)
Creatinine, Ser: 0.84 mg/dL (ref 0.61–1.24)
GFR calc Af Amer: >60 mL/min (ref >60)
GFR calc non Af Amer: >60 mL/min (ref >60)
Glucose, Bld: 151 mg/dL — ABNORMAL HIGH (ref 65–99)
Potassium: 4 mmol/L (ref 3.5–5.1)
Sodium: 135 mmol/L (ref 135–145)
Total Bilirubin: 0.4 mg/dL (ref 0.3–1.2)
Total Protein: 7.2 g/dL (ref 6.5–8.1)

## 2017-06-03 MED ORDER — PENTAFLUOROPROP-TETRAFLUOROETH EX AERO
INHALATION_SPRAY | CUTANEOUS | Status: AC
  Administered 2017-06-03: 16:00:00
  Filled 2017-06-03: qty 103.5

## 2017-06-03 MED ORDER — HYDROCODONE-ACETAMINOPHEN 5-325 MG PO TABS
1.0000 | ORAL_TABLET | Freq: Once | ORAL | Status: AC
  Administered 2017-06-03: 1 via ORAL
  Filled 2017-06-03: qty 1

## 2017-06-03 MED ORDER — CIPROFLOXACIN HCL 500 MG PO TABS: 500.0000 mg | ORAL_TABLET | Freq: Two times a day (BID) | ORAL | 0 refills | Status: DC

## 2017-06-03 MED ORDER — METRONIDAZOLE 250 MG PO TABS: 250.0000 mg | ORAL_TABLET | Freq: Three times a day (TID) | ORAL | 0 refills | Status: DC

## 2017-06-03 NOTE — Consult Note (Signed)
Patient:  Lance [name]  DOB:  07-May-1951  MRN:  [id number]   Preop Diagnosis:  Perirectal abscess same  Postop Diagnosis:  Same  Procedure:  Incision and drainage of perirectal abscess  Surgeon:  Aviva Signs, M.D.  Anes:  Local  Indications:  This is a 66 year old white male status post incision and drainage of a perirectal abscess in 2016 who presents with swelling and pain in the perianal region. He denies any fever or chills. On physical examination, he has a perianal abscess at the 5:00 position. I was consulted and elected to proceed with a bedside incision and drainage of the abscess. The patient gave verbal consent for this.  Procedure note: the patient was in the left lateral decubitus position. The perianal region was prepped and draped using usual sterile technique with Betadine. Aerofreeze was applied to the fluctulant area. An incision was made and purulent fluid under pressure was expressed. Aerobic cultures were taken and sent to microbiology. A Q-tip was used to probe the wound. The patient tolerated the procedure well. Minimal bleeding was noted.  Complications: None   Specimen:   Aerobic culture  EBL:  Minimal  Ciprofloxacin and Flagyl have been prescribed. I will see the patient in my office in 6 days for follow-up. No pain pills were prescribed as he already has a pain management clinic contract.

## 2017-06-03 NOTE — Discharge Instructions (Addendum)
How to Take a Sitz Bath °A sitz bath is a warm water bath that is taken while you are sitting down. The water should only come up to your hips and should cover your buttocks. Your health care provider may recommend a sitz bath to help you: °· Clean the lower part of your body, including your genital area. °· With itching. °· With pain. °· With sore muscles or muscles that tighten or spasm. ° °How to take a sitz bath °Take 3-4 sitz baths per day or as told by your health care provider. °1. Partially fill a bathtub with warm water. You will only need the water to be deep enough to cover your hips and buttocks when you are sitting in it. °2. If your health care provider told you to put medicine in the water, follow the directions exactly. °3. Sit in the water and open the tub drain a little. °4. Turn on the warm water again to keep the tub at the correct level. Keep the water running constantly. °5. Soak in the water for 15-20 minutes or as told by your health care provider. °6. After the sitz bath, pat the affected area dry first. Do not rub it. °7. Be careful when you stand up after the sitz bath because you may feel dizzy. ° °Contact a health care provider if: °· Your symptoms get worse. Do not continue with sitz baths if your symptoms get worse. °· You have new symptoms. Do not continue with sitz baths until you talk with your health care provider. °This information is not intended to replace advice given to you by your health care provider. Make sure you discuss any questions you have with your health care provider. °Document Released: 08/16/2004 Document Revised: 04/23/2016 Document Reviewed: 11/22/2014 °Elsevier Interactive Patient Education © 2018 Elsevier Inc[employer] ° °

## 2017-06-03 NOTE — ED Provider Notes (Signed)
[facility] DEPT Provider Note   CSN: [id number] Arrival date & time: 06/03/17  1048     History   Chief Complaint Chief Complaint  Patient presents with  . Abscess    HPI Lance [name] is a 66 y.o. male who presents with 2 days of worsening rectal pain and swelling. He reports that he was seen here approximately one year ago for something similar. He denies any fevers or chills, no nausea/vomiting. States that his pain increases with defecation. No problems with urination.  He denies abdominal pain.  HPI  Past Medical History:  Diagnosis Date  . Arthritis   . Back pain   . Diabetes mellitus without complication ([place])   . Gout     Patient Active Problem List   Diagnosis Date Noted  . Abscess     Past Surgical History:  Procedure Laterality Date  . EYE SURGERY    . LEG SURGERY         Home Medications    Prior to Admission medications   Medication Sig Start Date End Date Taking? Authorizing Provider  ciprofloxacin (CIPRO) 500 MG tablet Take 1 tablet (500 mg total) by mouth 2 (two) times daily. 03/11/15   Ashley Murrain, NP  ciprofloxacin (CIPRO) 500 MG tablet Take 1 tablet (500 mg total) by mouth 2 (two) times daily. 06/03/17   Aviva Signs, MD  HYDROcodone-acetaminophen (NORCO/VICODIN) 5-325 MG per tablet Take 1 tablet by mouth every 4 (four) hours as needed. 03/11/15   Ashley Murrain, NP  metroNIDAZOLE (FLAGYL) 250 MG tablet Take 1 tablet (250 mg total) by mouth 3 (three) times daily. 06/03/17   Aviva Signs, MD  metroNIDAZOLE (FLAGYL) 500 MG tablet Take 1 tablet (500 mg total) by mouth 2 (two) times daily. 03/11/15   Ashley Murrain, NP    Family History History reviewed. No pertinent family history.  Social History Social History  Substance Use Topics  . Smoking status: Current Every Day Smoker  . Smokeless tobacco: Never Used  . Alcohol use Yes     Comment: occasional      Allergies   Patient has no known allergies.   Review of Systems Review of  Systems  Constitutional: Negative for chills and fever.  HENT: Negative for ear pain and sore throat.   Eyes: Negative for pain and visual disturbance.  Respiratory: Negative for cough and shortness of breath.   Cardiovascular: Negative for chest pain and palpitations.  Gastrointestinal: Positive for rectal pain. Negative for abdominal pain and vomiting.  Genitourinary: Negative for dysuria and hematuria.  Musculoskeletal: Negative for arthralgias and back pain.  Skin: Positive for wound (and swelling to rectum). Negative for color change and rash.  Neurological: Negative for seizures and syncope.  All other systems reviewed and are negative.    Physical Exam Updated Vital Signs BP 121/88 (BP Location: Right Arm)   Pulse 93   Temp 98.2 F (36.8 C) (Oral)   Resp 19   Ht 6\' 2"  (1.88 m)   Wt 89.8 kg (198 lb)   SpO2 95%   BMI 25.42 kg/m   Physical Exam  Constitutional: He appears well-developed and well-nourished. No distress.  HENT:  Head: Normocephalic and atraumatic.  Right Ear: External ear normal.  Left Ear: External ear normal.  Mouth/Throat: No oropharyngeal exudate.  Eyes: Conjunctivae are normal. Right eye exhibits no discharge. Left eye exhibits no discharge. No scleral icterus.  Neck: Normal range of motion.  Cardiovascular: Normal rate and regular rhythm.  Pulmonary/Chest: Effort normal. No stridor. No respiratory distress.  Abdominal: Soft. Bowel sounds are normal. He exhibits no distension. There is no tenderness.  Genitourinary:     Genitourinary Comments: Obvious mass to left-sided peri-rectum around approximately 5:00 with patient supine. On DRE mass appears to extend into rectum.    Musculoskeletal: He exhibits no edema or deformity.  Neurological: He is alert. He exhibits normal muscle tone.  Skin: Skin is warm and dry. He is not diaphoretic.  Psychiatric: He has a normal mood and affect. His behavior is normal.  Nursing note and vitals  reviewed.    ED Treatments / Results  Labs (all labs ordered are listed, but only abnormal results are displayed) Labs Reviewed  COMPREHENSIVE METABOLIC PANEL - Abnormal; Notable for the following:       Result Value   Chloride 100 (*)    Glucose, Bld 151 (*)    ALT 15 (*)    All other components within normal limits  AEROBIC CULTURE (SUPERFICIAL SPECIMEN)  CBC WITH DIFFERENTIAL/PLATELET  CBG MONITORING, ED    EKG  EKG Interpretation None       Radiology US Pelvis Limited  Result Date: 06/03/2017 CLINICAL DATA:  Perirectal/perianal abscess EXAM: LIMITED ULTRASOUND OF PELVIS TECHNIQUE: Limited transabdominal ultrasound examination of the pelvis was performed at the site of clinical concern at the perirectal/perianal region. COMPARISON:  None FINDINGS: At perianal region, LEFT of the anus, a large complex fluid collection is identified which measures 4.5 x 2.7 cm in AP and transverse dimensions. Deep margin is less well delineated, at least 4.0 cm, though on additional imaging with a lower frequency transducer there is questionably a fluid collection deeper down to 6.6 cm depth. This contains scattered low level internal echogenicity and has irregular margins. Minimal surrounding hypervascularity. Finding is most suspicious for an abscess. IMPRESSION: Large complicated fluid collection LEFT of the anus 4.5 x 2.7 cm in axial dimensions, at least 4.0 cm in depth potentially deeper though the deep margin is not well delineated. Finding is most consistent with a perianal abscess. If there is concern for perirectal or intrapelvic extension, recommend CT pelvis with IV contrast to further assess. Electronically Signed   By: Lavonia Dana M.D.   On: 06/03/2017 13:10    Procedures Procedures (including critical care time)  Medications Ordered in ED Medications  HYDROcodone-acetaminophen (NORCO/VICODIN) 5-325 MG per tablet 1 tablet (1 tablet Oral Given 06/03/17 1339)   pentafluoroprop-tetrafluoroeth (GEBAUERS) aerosol (  Given 06/03/17 1534)     Initial Impression / Assessment and Plan / ED Course  I have reviewed the triage vital signs and the nursing notes.  Pertinent labs & imaging results that were available during my care of the patient were reviewed by me and considered in my medical decision making (see chart for details).  Clinical Course as of Jun 03 1733  Wed Jun 03, 2017  1349 Spoke with surgery, stated it will be about an hour and he will come see the patient.   [EH]    Clinical Course User Index [EH] Lorin Glass, PA-C   Abner Greenspan presents with a peri-rectal abscess and swelling.  On DRE the mass appeared to extend into the rectum. Ultrasound was used (CT scan unavailable) to better characterize the extension.  Based on ultrasound General surgery was consulted to see the patient and evaluate him.  Baseline labs obtained.  Patient with out evidence of systematic infection, he is afebrile, not tachycardic or hypotensive.  General surgery evaluated  patient, performed a bedside I &D and will place patient on antibiotics.  Surgeon reports he offered patient pain medication, however patient is in a pain program and denied narcotic pain medication.  Patient instructed on sitz baths and to follow up with surgery in one week.     At this time there does not appear to be any evidence of an acute emergency medical condition and the patient appears stable for discharge with appropriate outpatient follow up.Diagnosis was discussed with patient who verbalizes understanding and is agreeable to discharge. Pt case discussed with Dr. Thurnell Garbe who agrees with my plan.     Final Clinical Impressions(s) / ED Diagnoses   Final diagnoses:  Abscess    New Prescriptions Discharge Medication List as of 06/03/2017  3:29 PM       Lorin Glass, PA-C 06/03/17 [place], [place], DO 06/06/17 [phone]

## 2017-06-06 LAB — AEROBIC CULTURE W GRAM STAIN (SUPERFICIAL SPECIMEN): Special Requests: NORMAL

## 2017-06-07 ENCOUNTER — Telehealth: Payer: Self-pay

## 2017-06-07 NOTE — Telephone Encounter (Signed)
Post ED Visit - Positive Culture Follow-up: Unsuccessful Patient Follow-up  Culture assessed and recommendations reviewed by:  []  Elenor Quinones, Pharm.D. []  Heide Guile, Pharm.D., BCPS AQ-ID []  Parks Neptune, Pharm.D., BCPS []  Alycia Rossetti, Pharm.D., BCPS []  [place], Pharm.D., BCPS, AAHIVP []  Legrand Como, Pharm.D., BCPS, AAHIVP [x]  Salome Arnt, PharmD, BCPS []  Dimitri Ped, PharmD, BCPS []  Vincenza Hews, PharmD, BCPS  Positive aerobic culture  []  Patient discharged without antimicrobial prescription and treatment is now indicated [x]  Organism is resistant to prescribed ED discharge antimicrobial []  Patient with positive blood cultures   Unable to contact patient after 3 attempts, letter will be sent to address on file  Genia Del 06/07/2017, 10:49 AM

## 2017-06-07 NOTE — Progress Notes (Signed)
ED Antimicrobial Stewardship Positive Culture Follow Up   Lance [name] is an 66 y.o. male who presented to [facility] on 06/03/2017 with a chief complaint of  Chief Complaint  Patient presents with  . Abscess    Recent Results (from the past 720 hour(s))  Wound or Superficial Culture     Status: None   Collection Time: 06/03/17  3:00 PM  Result Value Ref Range Status   Specimen Description ANAL  Final   Special Requests Normal  Final   Gram Stain   Final    ABUNDANT WBC PRESENT, PREDOMINANTLY PMN FEW GRAM NEGATIVE RODS FEW GRAM POSITIVE RODS RARE GRAM POSITIVE COCCI IN PAIRS Performed at [facility], [place] [address], [place],  [address]    Culture FEW ESCHERICHIA COLI  Final   Report Status 06/06/2017 FINAL  Final   Organism ID, Bacteria ESCHERICHIA COLI  Final      Susceptibility   Escherichia coli - MIC*    AMPICILLIN >=32 RESISTANT Resistant     CEFAZOLIN <=4 SENSITIVE Sensitive     CEFEPIME <=1 SENSITIVE Sensitive     CEFTAZIDIME <=1 SENSITIVE Sensitive     CEFTRIAXONE <=1 SENSITIVE Sensitive     CIPROFLOXACIN >=4 RESISTANT Resistant     GENTAMICIN <=1 SENSITIVE Sensitive     IMIPENEM <=0.25 SENSITIVE Sensitive     TRIMETH/SULFA <=20 SENSITIVE Sensitive     AMPICILLIN/SULBACTAM 16 INTERMEDIATE Intermediate     PIP/TAZO <=4 SENSITIVE Sensitive     Extended ESBL NEGATIVE Sensitive     * FEW ESCHERICHIA COLI    [x]  Treated with cipro, organism resistant to prescribed antimicrobial []  Patient discharged originally without antimicrobial agent and treatment is now indicated  New antibiotic prescription: DC cipro, start keflex 500mg  PO TID x 7 days  ED Provider: Providence Lanius, PA   Rumbarger, Rande Lawman 06/07/2017, 10:28 AM Clinical Pharmacist Phone# [phone]

## 2017-06-15 ENCOUNTER — Telehealth: Payer: Self-pay | Admitting: Emergency Medicine

## 2017-06-16 DIAGNOSIS — H18411 Arcus senilis, right eye: Secondary | ICD-10-CM | POA: Diagnosis not present

## 2017-06-16 DIAGNOSIS — H04123 Dry eye syndrome of bilateral lacrimal glands: Secondary | ICD-10-CM | POA: Diagnosis not present

## 2017-06-16 DIAGNOSIS — Z961 Presence of intraocular lens: Secondary | ICD-10-CM | POA: Diagnosis not present

## 2017-06-17 DIAGNOSIS — Z1211 Encounter for screening for malignant neoplasm of colon: Secondary | ICD-10-CM | POA: Diagnosis not present

## 2017-06-17 DIAGNOSIS — Z299 Encounter for prophylactic measures, unspecified: Secondary | ICD-10-CM | POA: Diagnosis not present

## 2017-06-17 DIAGNOSIS — E1165 Type 2 diabetes mellitus with hyperglycemia: Secondary | ICD-10-CM | POA: Diagnosis not present

## 2017-06-17 DIAGNOSIS — Z6826 Body mass index (BMI) 26.0-26.9, adult: Secondary | ICD-10-CM | POA: Diagnosis not present

## 2017-06-17 DIAGNOSIS — Z1389 Encounter for screening for other disorder: Secondary | ICD-10-CM | POA: Diagnosis not present

## 2017-06-17 DIAGNOSIS — E7800 Pure hypercholesterolemia, unspecified: Secondary | ICD-10-CM | POA: Diagnosis not present

## 2017-06-17 DIAGNOSIS — E1142 Type 2 diabetes mellitus with diabetic polyneuropathy: Secondary | ICD-10-CM | POA: Diagnosis not present

## 2017-06-17 DIAGNOSIS — J449 Chronic obstructive pulmonary disease, unspecified: Secondary | ICD-10-CM | POA: Diagnosis not present

## 2017-06-17 DIAGNOSIS — I10 Essential (primary) hypertension: Secondary | ICD-10-CM | POA: Diagnosis not present

## 2017-06-17 DIAGNOSIS — R5383 Other fatigue: Secondary | ICD-10-CM | POA: Diagnosis not present

## 2017-06-17 DIAGNOSIS — Z7189 Other specified counseling: Secondary | ICD-10-CM | POA: Diagnosis not present

## 2017-06-17 DIAGNOSIS — Z0000 Encounter for general adult medical examination without abnormal findings: Secondary | ICD-10-CM | POA: Diagnosis not present

## 2017-07-03 DIAGNOSIS — Z125 Encounter for screening for malignant neoplasm of prostate: Secondary | ICD-10-CM | POA: Diagnosis not present

## 2017-07-03 DIAGNOSIS — E7800 Pure hypercholesterolemia, unspecified: Secondary | ICD-10-CM | POA: Diagnosis not present

## 2017-07-03 DIAGNOSIS — R5383 Other fatigue: Secondary | ICD-10-CM | POA: Diagnosis not present

## 2017-07-03 DIAGNOSIS — Z79899 Other long term (current) drug therapy: Secondary | ICD-10-CM | POA: Diagnosis not present

## 2017-07-15 DIAGNOSIS — E7800 Pure hypercholesterolemia, unspecified: Secondary | ICD-10-CM | POA: Diagnosis not present

## 2017-07-15 DIAGNOSIS — J441 Chronic obstructive pulmonary disease with (acute) exacerbation: Secondary | ICD-10-CM | POA: Diagnosis not present

## 2017-07-15 DIAGNOSIS — E1165 Type 2 diabetes mellitus with hyperglycemia: Secondary | ICD-10-CM | POA: Diagnosis not present

## 2017-07-15 DIAGNOSIS — E1142 Type 2 diabetes mellitus with diabetic polyneuropathy: Secondary | ICD-10-CM | POA: Diagnosis not present

## 2017-07-15 DIAGNOSIS — Z6826 Body mass index (BMI) 26.0-26.9, adult: Secondary | ICD-10-CM | POA: Diagnosis not present

## 2017-07-15 DIAGNOSIS — G8929 Other chronic pain: Secondary | ICD-10-CM | POA: Diagnosis not present

## 2017-07-15 DIAGNOSIS — J449 Chronic obstructive pulmonary disease, unspecified: Secondary | ICD-10-CM | POA: Diagnosis not present

## 2017-07-15 DIAGNOSIS — F17210 Nicotine dependence, cigarettes, uncomplicated: Secondary | ICD-10-CM | POA: Diagnosis not present

## 2017-07-15 DIAGNOSIS — Z299 Encounter for prophylactic measures, unspecified: Secondary | ICD-10-CM | POA: Diagnosis not present

## 2017-07-15 DIAGNOSIS — I10 Essential (primary) hypertension: Secondary | ICD-10-CM | POA: Diagnosis not present

## 2017-07-17 DIAGNOSIS — J449 Chronic obstructive pulmonary disease, unspecified: Secondary | ICD-10-CM | POA: Diagnosis not present

## 2017-07-17 DIAGNOSIS — E7800 Pure hypercholesterolemia, unspecified: Secondary | ICD-10-CM | POA: Diagnosis not present

## 2017-07-17 DIAGNOSIS — E119 Type 2 diabetes mellitus without complications: Secondary | ICD-10-CM | POA: Diagnosis not present

## 2017-08-06 DIAGNOSIS — Z23 Encounter for immunization: Secondary | ICD-10-CM | POA: Diagnosis not present

## 2017-08-14 DIAGNOSIS — G8929 Other chronic pain: Secondary | ICD-10-CM | POA: Diagnosis not present

## 2017-08-14 DIAGNOSIS — E7800 Pure hypercholesterolemia, unspecified: Secondary | ICD-10-CM | POA: Diagnosis not present

## 2017-08-14 DIAGNOSIS — I10 Essential (primary) hypertension: Secondary | ICD-10-CM | POA: Diagnosis not present

## 2017-08-14 DIAGNOSIS — Z299 Encounter for prophylactic measures, unspecified: Secondary | ICD-10-CM | POA: Diagnosis not present

## 2017-08-14 DIAGNOSIS — Z6826 Body mass index (BMI) 26.0-26.9, adult: Secondary | ICD-10-CM | POA: Diagnosis not present

## 2017-08-14 DIAGNOSIS — E1142 Type 2 diabetes mellitus with diabetic polyneuropathy: Secondary | ICD-10-CM | POA: Diagnosis not present

## 2017-08-14 DIAGNOSIS — F17210 Nicotine dependence, cigarettes, uncomplicated: Secondary | ICD-10-CM | POA: Diagnosis not present

## 2017-08-14 DIAGNOSIS — E1165 Type 2 diabetes mellitus with hyperglycemia: Secondary | ICD-10-CM | POA: Diagnosis not present

## 2017-08-14 DIAGNOSIS — J449 Chronic obstructive pulmonary disease, unspecified: Secondary | ICD-10-CM | POA: Diagnosis not present

## 2017-08-26 DIAGNOSIS — J449 Chronic obstructive pulmonary disease, unspecified: Secondary | ICD-10-CM | POA: Diagnosis not present

## 2017-08-26 DIAGNOSIS — E119 Type 2 diabetes mellitus without complications: Secondary | ICD-10-CM | POA: Diagnosis not present

## 2017-08-26 DIAGNOSIS — E7800 Pure hypercholesterolemia, unspecified: Secondary | ICD-10-CM | POA: Diagnosis not present

## 2017-09-14 DIAGNOSIS — F17210 Nicotine dependence, cigarettes, uncomplicated: Secondary | ICD-10-CM | POA: Diagnosis not present

## 2017-09-14 DIAGNOSIS — E1142 Type 2 diabetes mellitus with diabetic polyneuropathy: Secondary | ICD-10-CM | POA: Diagnosis not present

## 2017-09-14 DIAGNOSIS — Z6826 Body mass index (BMI) 26.0-26.9, adult: Secondary | ICD-10-CM | POA: Diagnosis not present

## 2017-09-14 DIAGNOSIS — J449 Chronic obstructive pulmonary disease, unspecified: Secondary | ICD-10-CM | POA: Diagnosis not present

## 2017-09-14 DIAGNOSIS — Z299 Encounter for prophylactic measures, unspecified: Secondary | ICD-10-CM | POA: Diagnosis not present

## 2017-09-14 DIAGNOSIS — M545 Low back pain: Secondary | ICD-10-CM | POA: Diagnosis not present

## 2017-09-14 DIAGNOSIS — E1165 Type 2 diabetes mellitus with hyperglycemia: Secondary | ICD-10-CM | POA: Diagnosis not present

## 2017-09-14 DIAGNOSIS — I10 Essential (primary) hypertension: Secondary | ICD-10-CM | POA: Diagnosis not present

## 2017-09-15 DIAGNOSIS — E119 Type 2 diabetes mellitus without complications: Secondary | ICD-10-CM | POA: Diagnosis not present

## 2017-09-15 DIAGNOSIS — E7800 Pure hypercholesterolemia, unspecified: Secondary | ICD-10-CM | POA: Diagnosis not present

## 2017-09-15 DIAGNOSIS — J449 Chronic obstructive pulmonary disease, unspecified: Secondary | ICD-10-CM | POA: Diagnosis not present

## 2017-09-23 DIAGNOSIS — G894 Chronic pain syndrome: Secondary | ICD-10-CM | POA: Diagnosis not present

## 2017-09-23 DIAGNOSIS — Z5181 Encounter for therapeutic drug level monitoring: Secondary | ICD-10-CM | POA: Diagnosis not present

## 2017-09-23 DIAGNOSIS — Z79891 Long term (current) use of opiate analgesic: Secondary | ICD-10-CM | POA: Diagnosis not present

## 2017-09-23 DIAGNOSIS — E11610 Type 2 diabetes mellitus with diabetic neuropathic arthropathy: Secondary | ICD-10-CM | POA: Diagnosis not present

## 2017-09-23 DIAGNOSIS — E1340 Other specified diabetes mellitus with diabetic neuropathy, unspecified: Secondary | ICD-10-CM | POA: Diagnosis not present

## 2017-09-23 DIAGNOSIS — M5416 Radiculopathy, lumbar region: Secondary | ICD-10-CM | POA: Diagnosis not present

## 2017-09-28 DIAGNOSIS — G894 Chronic pain syndrome: Secondary | ICD-10-CM | POA: Diagnosis not present

## 2017-09-28 DIAGNOSIS — E11610 Type 2 diabetes mellitus with diabetic neuropathic arthropathy: Secondary | ICD-10-CM | POA: Diagnosis not present

## 2017-09-28 DIAGNOSIS — Z5181 Encounter for therapeutic drug level monitoring: Secondary | ICD-10-CM | POA: Diagnosis not present

## 2017-09-28 DIAGNOSIS — Z79891 Long term (current) use of opiate analgesic: Secondary | ICD-10-CM | POA: Diagnosis not present

## 2017-09-30 DIAGNOSIS — J449 Chronic obstructive pulmonary disease, unspecified: Secondary | ICD-10-CM | POA: Diagnosis not present

## 2017-09-30 DIAGNOSIS — Z6826 Body mass index (BMI) 26.0-26.9, adult: Secondary | ICD-10-CM | POA: Diagnosis not present

## 2017-09-30 DIAGNOSIS — I10 Essential (primary) hypertension: Secondary | ICD-10-CM | POA: Diagnosis not present

## 2017-09-30 DIAGNOSIS — G8929 Other chronic pain: Secondary | ICD-10-CM | POA: Diagnosis not present

## 2017-09-30 DIAGNOSIS — K1379 Other lesions of oral mucosa: Secondary | ICD-10-CM | POA: Diagnosis not present

## 2017-09-30 DIAGNOSIS — Z299 Encounter for prophylactic measures, unspecified: Secondary | ICD-10-CM | POA: Diagnosis not present

## 2017-09-30 DIAGNOSIS — Z79899 Other long term (current) drug therapy: Secondary | ICD-10-CM | POA: Diagnosis not present

## 2017-09-30 DIAGNOSIS — E1165 Type 2 diabetes mellitus with hyperglycemia: Secondary | ICD-10-CM | POA: Diagnosis not present

## 2017-09-30 DIAGNOSIS — E1142 Type 2 diabetes mellitus with diabetic polyneuropathy: Secondary | ICD-10-CM | POA: Diagnosis not present

## 2017-09-30 DIAGNOSIS — E7800 Pure hypercholesterolemia, unspecified: Secondary | ICD-10-CM | POA: Diagnosis not present

## 2017-09-30 DIAGNOSIS — F17210 Nicotine dependence, cigarettes, uncomplicated: Secondary | ICD-10-CM | POA: Diagnosis not present

## 2017-10-12 DIAGNOSIS — E7800 Pure hypercholesterolemia, unspecified: Secondary | ICD-10-CM | POA: Diagnosis not present

## 2017-10-12 DIAGNOSIS — J449 Chronic obstructive pulmonary disease, unspecified: Secondary | ICD-10-CM | POA: Diagnosis not present

## 2017-10-12 DIAGNOSIS — E119 Type 2 diabetes mellitus without complications: Secondary | ICD-10-CM | POA: Diagnosis not present

## 2017-10-13 DIAGNOSIS — F4320 Adjustment disorder, unspecified: Secondary | ICD-10-CM | POA: Diagnosis not present

## 2017-10-21 DIAGNOSIS — Z6826 Body mass index (BMI) 26.0-26.9, adult: Secondary | ICD-10-CM | POA: Diagnosis not present

## 2017-10-21 DIAGNOSIS — Z299 Encounter for prophylactic measures, unspecified: Secondary | ICD-10-CM | POA: Diagnosis not present

## 2017-10-21 DIAGNOSIS — E1142 Type 2 diabetes mellitus with diabetic polyneuropathy: Secondary | ICD-10-CM | POA: Diagnosis not present

## 2017-10-21 DIAGNOSIS — E1165 Type 2 diabetes mellitus with hyperglycemia: Secondary | ICD-10-CM | POA: Diagnosis not present

## 2017-10-21 DIAGNOSIS — J449 Chronic obstructive pulmonary disease, unspecified: Secondary | ICD-10-CM | POA: Diagnosis not present

## 2017-10-21 DIAGNOSIS — G8929 Other chronic pain: Secondary | ICD-10-CM | POA: Diagnosis not present

## 2017-10-26 DIAGNOSIS — Z5181 Encounter for therapeutic drug level monitoring: Secondary | ICD-10-CM | POA: Diagnosis not present

## 2017-10-26 DIAGNOSIS — Z79891 Long term (current) use of opiate analgesic: Secondary | ICD-10-CM | POA: Diagnosis not present

## 2017-10-26 DIAGNOSIS — G894 Chronic pain syndrome: Secondary | ICD-10-CM | POA: Diagnosis not present

## 2017-10-26 DIAGNOSIS — E11610 Type 2 diabetes mellitus with diabetic neuropathic arthropathy: Secondary | ICD-10-CM | POA: Diagnosis not present

## 2017-11-09 DIAGNOSIS — E11610 Type 2 diabetes mellitus with diabetic neuropathic arthropathy: Secondary | ICD-10-CM | POA: Diagnosis not present

## 2017-11-09 DIAGNOSIS — G894 Chronic pain syndrome: Secondary | ICD-10-CM | POA: Diagnosis not present

## 2017-11-09 DIAGNOSIS — Z79891 Long term (current) use of opiate analgesic: Secondary | ICD-10-CM | POA: Diagnosis not present

## 2017-11-09 DIAGNOSIS — Z5181 Encounter for therapeutic drug level monitoring: Secondary | ICD-10-CM | POA: Diagnosis not present

## 2017-11-27 DIAGNOSIS — Z6826 Body mass index (BMI) 26.0-26.9, adult: Secondary | ICD-10-CM | POA: Diagnosis not present

## 2017-11-27 DIAGNOSIS — J449 Chronic obstructive pulmonary disease, unspecified: Secondary | ICD-10-CM | POA: Diagnosis not present

## 2017-11-27 DIAGNOSIS — Z299 Encounter for prophylactic measures, unspecified: Secondary | ICD-10-CM | POA: Diagnosis not present

## 2017-11-27 DIAGNOSIS — G8929 Other chronic pain: Secondary | ICD-10-CM | POA: Diagnosis not present

## 2017-11-27 DIAGNOSIS — D490 Neoplasm of unspecified behavior of digestive system: Secondary | ICD-10-CM | POA: Diagnosis not present

## 2017-11-27 DIAGNOSIS — I10 Essential (primary) hypertension: Secondary | ICD-10-CM | POA: Diagnosis not present

## 2017-11-27 DIAGNOSIS — E1142 Type 2 diabetes mellitus with diabetic polyneuropathy: Secondary | ICD-10-CM | POA: Diagnosis not present

## 2017-11-27 DIAGNOSIS — E7800 Pure hypercholesterolemia, unspecified: Secondary | ICD-10-CM | POA: Diagnosis not present

## 2017-11-27 DIAGNOSIS — J441 Chronic obstructive pulmonary disease with (acute) exacerbation: Secondary | ICD-10-CM | POA: Diagnosis not present

## 2017-11-27 DIAGNOSIS — E1165 Type 2 diabetes mellitus with hyperglycemia: Secondary | ICD-10-CM | POA: Diagnosis not present

## 2017-11-27 DIAGNOSIS — F17210 Nicotine dependence, cigarettes, uncomplicated: Secondary | ICD-10-CM | POA: Diagnosis not present

## 2017-11-30 DIAGNOSIS — E7800 Pure hypercholesterolemia, unspecified: Secondary | ICD-10-CM | POA: Diagnosis not present

## 2017-11-30 DIAGNOSIS — J449 Chronic obstructive pulmonary disease, unspecified: Secondary | ICD-10-CM | POA: Diagnosis not present

## 2017-11-30 DIAGNOSIS — E119 Type 2 diabetes mellitus without complications: Secondary | ICD-10-CM | POA: Diagnosis not present

## 2017-12-02 ENCOUNTER — Other Ambulatory Visit (INDEPENDENT_AMBULATORY_CARE_PROVIDER_SITE_OTHER): Payer: Self-pay | Admitting: Otolaryngology

## 2017-12-02 DIAGNOSIS — R070 Pain in throat: Secondary | ICD-10-CM | POA: Diagnosis not present

## 2017-12-02 DIAGNOSIS — R220 Localized swelling, mass and lump, head: Principal | ICD-10-CM

## 2017-12-02 DIAGNOSIS — K148 Other diseases of tongue: Secondary | ICD-10-CM

## 2017-12-15 ENCOUNTER — Ambulatory Visit (HOSPITAL_COMMUNITY)
Admission: RE | Admit: 2017-12-15 | Discharge: 2017-12-15 | Disposition: A | Discharge status: Home / Self Care | Payer: Medicare Other | Source: Ambulatory Visit | Attending: Otolaryngology | Admitting: Otolaryngology

## 2017-12-15 DIAGNOSIS — R220 Localized swelling, mass and lump, head: Secondary | ICD-10-CM | POA: Insufficient documentation

## 2017-12-15 DIAGNOSIS — J320 Chronic maxillary sinusitis: Secondary | ICD-10-CM | POA: Diagnosis not present

## 2017-12-15 DIAGNOSIS — R221 Localized swelling, mass and lump, neck: Secondary | ICD-10-CM | POA: Diagnosis not present

## 2017-12-15 DIAGNOSIS — K148 Other diseases of tongue: Secondary | ICD-10-CM

## 2017-12-15 LAB — POCT I-STAT CREATININE: Creatinine, Ser: 0.8 mg/dL (ref 0.61–1.24)

## 2017-12-15 MED ORDER — IOPAMIDOL (ISOVUE-300) INJECTION 61%
75.0000 mL | Freq: Once | INTRAVENOUS | Status: AC | PRN
  Administered 2017-12-15: 75 mL via INTRAVENOUS

## 2017-12-16 DIAGNOSIS — Z79891 Long term (current) use of opiate analgesic: Secondary | ICD-10-CM | POA: Diagnosis not present

## 2017-12-16 DIAGNOSIS — G894 Chronic pain syndrome: Secondary | ICD-10-CM | POA: Diagnosis not present

## 2017-12-16 DIAGNOSIS — Z5181 Encounter for therapeutic drug level monitoring: Secondary | ICD-10-CM | POA: Diagnosis not present

## 2017-12-16 DIAGNOSIS — E11610 Type 2 diabetes mellitus with diabetic neuropathic arthropathy: Secondary | ICD-10-CM | POA: Diagnosis not present

## 2018-01-13 DIAGNOSIS — Z5181 Encounter for therapeutic drug level monitoring: Secondary | ICD-10-CM | POA: Diagnosis not present

## 2018-01-13 DIAGNOSIS — Z79891 Long term (current) use of opiate analgesic: Secondary | ICD-10-CM | POA: Diagnosis not present

## 2018-01-13 DIAGNOSIS — E11610 Type 2 diabetes mellitus with diabetic neuropathic arthropathy: Secondary | ICD-10-CM | POA: Diagnosis not present

## 2018-01-13 DIAGNOSIS — G894 Chronic pain syndrome: Secondary | ICD-10-CM | POA: Diagnosis not present

## 2018-01-22 DIAGNOSIS — E7800 Pure hypercholesterolemia, unspecified: Secondary | ICD-10-CM | POA: Diagnosis not present

## 2018-01-22 DIAGNOSIS — E119 Type 2 diabetes mellitus without complications: Secondary | ICD-10-CM | POA: Diagnosis not present

## 2018-01-22 DIAGNOSIS — J449 Chronic obstructive pulmonary disease, unspecified: Secondary | ICD-10-CM | POA: Diagnosis not present

## 2018-02-04 DIAGNOSIS — I10 Essential (primary) hypertension: Secondary | ICD-10-CM | POA: Diagnosis not present

## 2018-02-04 DIAGNOSIS — Z6825 Body mass index (BMI) 25.0-25.9, adult: Secondary | ICD-10-CM | POA: Diagnosis not present

## 2018-02-04 DIAGNOSIS — F17210 Nicotine dependence, cigarettes, uncomplicated: Secondary | ICD-10-CM | POA: Diagnosis not present

## 2018-02-04 DIAGNOSIS — J449 Chronic obstructive pulmonary disease, unspecified: Secondary | ICD-10-CM | POA: Diagnosis not present

## 2018-02-04 DIAGNOSIS — E1142 Type 2 diabetes mellitus with diabetic polyneuropathy: Secondary | ICD-10-CM | POA: Diagnosis not present

## 2018-02-04 DIAGNOSIS — Z299 Encounter for prophylactic measures, unspecified: Secondary | ICD-10-CM | POA: Diagnosis not present

## 2018-02-04 DIAGNOSIS — E1165 Type 2 diabetes mellitus with hyperglycemia: Secondary | ICD-10-CM | POA: Diagnosis not present

## 2018-02-09 DIAGNOSIS — J449 Chronic obstructive pulmonary disease, unspecified: Secondary | ICD-10-CM | POA: Diagnosis not present

## 2018-02-09 DIAGNOSIS — E7800 Pure hypercholesterolemia, unspecified: Secondary | ICD-10-CM | POA: Diagnosis not present

## 2018-02-09 DIAGNOSIS — E119 Type 2 diabetes mellitus without complications: Secondary | ICD-10-CM | POA: Diagnosis not present

## 2018-02-10 DIAGNOSIS — Z79891 Long term (current) use of opiate analgesic: Secondary | ICD-10-CM | POA: Diagnosis not present

## 2018-02-10 DIAGNOSIS — G894 Chronic pain syndrome: Secondary | ICD-10-CM | POA: Diagnosis not present

## 2018-02-10 DIAGNOSIS — E11610 Type 2 diabetes mellitus with diabetic neuropathic arthropathy: Secondary | ICD-10-CM | POA: Diagnosis not present

## 2018-02-10 DIAGNOSIS — Z5181 Encounter for therapeutic drug level monitoring: Secondary | ICD-10-CM | POA: Diagnosis not present

## 2018-03-10 DIAGNOSIS — E11610 Type 2 diabetes mellitus with diabetic neuropathic arthropathy: Secondary | ICD-10-CM | POA: Diagnosis not present

## 2018-03-10 DIAGNOSIS — G894 Chronic pain syndrome: Secondary | ICD-10-CM | POA: Diagnosis not present

## 2018-03-10 DIAGNOSIS — Z79891 Long term (current) use of opiate analgesic: Secondary | ICD-10-CM | POA: Diagnosis not present

## 2018-03-10 DIAGNOSIS — Z5181 Encounter for therapeutic drug level monitoring: Secondary | ICD-10-CM | POA: Diagnosis not present

## 2018-03-24 DIAGNOSIS — E119 Type 2 diabetes mellitus without complications: Secondary | ICD-10-CM | POA: Diagnosis not present

## 2018-03-24 DIAGNOSIS — J449 Chronic obstructive pulmonary disease, unspecified: Secondary | ICD-10-CM | POA: Diagnosis not present

## 2018-03-24 DIAGNOSIS — E7800 Pure hypercholesterolemia, unspecified: Secondary | ICD-10-CM | POA: Diagnosis not present

## 2018-04-07 DIAGNOSIS — G894 Chronic pain syndrome: Secondary | ICD-10-CM | POA: Diagnosis not present

## 2018-04-07 DIAGNOSIS — Z79891 Long term (current) use of opiate analgesic: Secondary | ICD-10-CM | POA: Diagnosis not present

## 2018-04-07 DIAGNOSIS — E11610 Type 2 diabetes mellitus with diabetic neuropathic arthropathy: Secondary | ICD-10-CM | POA: Diagnosis not present

## 2018-04-07 DIAGNOSIS — Z5181 Encounter for therapeutic drug level monitoring: Secondary | ICD-10-CM | POA: Diagnosis not present

## 2018-05-04 DIAGNOSIS — Z5181 Encounter for therapeutic drug level monitoring: Secondary | ICD-10-CM | POA: Diagnosis not present

## 2018-05-04 DIAGNOSIS — G894 Chronic pain syndrome: Secondary | ICD-10-CM | POA: Diagnosis not present

## 2018-05-04 DIAGNOSIS — E11610 Type 2 diabetes mellitus with diabetic neuropathic arthropathy: Secondary | ICD-10-CM | POA: Diagnosis not present

## 2018-05-04 DIAGNOSIS — Z79891 Long term (current) use of opiate analgesic: Secondary | ICD-10-CM | POA: Diagnosis not present

## 2018-05-13 DIAGNOSIS — Z299 Encounter for prophylactic measures, unspecified: Secondary | ICD-10-CM | POA: Diagnosis not present

## 2018-05-13 DIAGNOSIS — Z6826 Body mass index (BMI) 26.0-26.9, adult: Secondary | ICD-10-CM | POA: Diagnosis not present

## 2018-05-13 DIAGNOSIS — E1165 Type 2 diabetes mellitus with hyperglycemia: Secondary | ICD-10-CM | POA: Diagnosis not present

## 2018-05-13 DIAGNOSIS — E1142 Type 2 diabetes mellitus with diabetic polyneuropathy: Secondary | ICD-10-CM | POA: Diagnosis not present

## 2018-05-13 DIAGNOSIS — M545 Low back pain: Secondary | ICD-10-CM | POA: Diagnosis not present

## 2018-05-13 DIAGNOSIS — J449 Chronic obstructive pulmonary disease, unspecified: Secondary | ICD-10-CM | POA: Diagnosis not present

## 2018-05-13 DIAGNOSIS — I10 Essential (primary) hypertension: Secondary | ICD-10-CM | POA: Diagnosis not present

## 2018-05-31 DIAGNOSIS — Z5181 Encounter for therapeutic drug level monitoring: Secondary | ICD-10-CM | POA: Diagnosis not present

## 2018-05-31 DIAGNOSIS — G894 Chronic pain syndrome: Secondary | ICD-10-CM | POA: Diagnosis not present

## 2018-05-31 DIAGNOSIS — E11610 Type 2 diabetes mellitus with diabetic neuropathic arthropathy: Secondary | ICD-10-CM | POA: Diagnosis not present

## 2018-05-31 DIAGNOSIS — Z79891 Long term (current) use of opiate analgesic: Secondary | ICD-10-CM | POA: Diagnosis not present

## 2018-06-03 DIAGNOSIS — J449 Chronic obstructive pulmonary disease, unspecified: Secondary | ICD-10-CM | POA: Diagnosis not present

## 2018-06-03 DIAGNOSIS — E7800 Pure hypercholesterolemia, unspecified: Secondary | ICD-10-CM | POA: Diagnosis not present

## 2018-06-03 DIAGNOSIS — E119 Type 2 diabetes mellitus without complications: Secondary | ICD-10-CM | POA: Diagnosis not present

## 2018-06-28 DIAGNOSIS — Z5181 Encounter for therapeutic drug level monitoring: Secondary | ICD-10-CM | POA: Diagnosis not present

## 2018-06-28 DIAGNOSIS — E11610 Type 2 diabetes mellitus with diabetic neuropathic arthropathy: Secondary | ICD-10-CM | POA: Diagnosis not present

## 2018-06-28 DIAGNOSIS — Z79891 Long term (current) use of opiate analgesic: Secondary | ICD-10-CM | POA: Diagnosis not present

## 2018-06-28 DIAGNOSIS — G894 Chronic pain syndrome: Secondary | ICD-10-CM | POA: Diagnosis not present

## 2018-07-07 DIAGNOSIS — J449 Chronic obstructive pulmonary disease, unspecified: Secondary | ICD-10-CM | POA: Diagnosis not present

## 2018-07-07 DIAGNOSIS — E7800 Pure hypercholesterolemia, unspecified: Secondary | ICD-10-CM | POA: Diagnosis not present

## 2018-07-07 DIAGNOSIS — E119 Type 2 diabetes mellitus without complications: Secondary | ICD-10-CM | POA: Diagnosis not present

## 2018-07-26 DIAGNOSIS — E11610 Type 2 diabetes mellitus with diabetic neuropathic arthropathy: Secondary | ICD-10-CM | POA: Diagnosis not present

## 2018-07-26 DIAGNOSIS — Z5181 Encounter for therapeutic drug level monitoring: Secondary | ICD-10-CM | POA: Diagnosis not present

## 2018-07-26 DIAGNOSIS — G894 Chronic pain syndrome: Secondary | ICD-10-CM | POA: Diagnosis not present

## 2018-07-26 DIAGNOSIS — Z79891 Long term (current) use of opiate analgesic: Secondary | ICD-10-CM | POA: Diagnosis not present

## 2018-08-04 DIAGNOSIS — J449 Chronic obstructive pulmonary disease, unspecified: Secondary | ICD-10-CM | POA: Diagnosis not present

## 2018-08-04 DIAGNOSIS — E7800 Pure hypercholesterolemia, unspecified: Secondary | ICD-10-CM | POA: Diagnosis not present

## 2018-08-04 DIAGNOSIS — E119 Type 2 diabetes mellitus without complications: Secondary | ICD-10-CM | POA: Diagnosis not present

## 2018-08-16 DIAGNOSIS — H04121 Dry eye syndrome of right lacrimal gland: Secondary | ICD-10-CM | POA: Diagnosis not present

## 2018-08-16 DIAGNOSIS — H16221 Keratoconjunctivitis sicca, not specified as Sjogren's, right eye: Secondary | ICD-10-CM | POA: Diagnosis not present

## 2018-08-16 DIAGNOSIS — H18411 Arcus senilis, right eye: Secondary | ICD-10-CM | POA: Diagnosis not present

## 2018-08-16 DIAGNOSIS — H26491 Other secondary cataract, right eye: Secondary | ICD-10-CM | POA: Diagnosis not present

## 2018-08-16 DIAGNOSIS — Z299 Encounter for prophylactic measures, unspecified: Secondary | ICD-10-CM | POA: Diagnosis not present

## 2018-08-16 DIAGNOSIS — F17210 Nicotine dependence, cigarettes, uncomplicated: Secondary | ICD-10-CM | POA: Diagnosis not present

## 2018-08-16 DIAGNOSIS — Z961 Presence of intraocular lens: Secondary | ICD-10-CM | POA: Diagnosis not present

## 2018-08-16 DIAGNOSIS — H00022 Hordeolum internum right lower eyelid: Secondary | ICD-10-CM | POA: Diagnosis not present

## 2018-08-16 DIAGNOSIS — I10 Essential (primary) hypertension: Secondary | ICD-10-CM | POA: Diagnosis not present

## 2018-08-16 DIAGNOSIS — Z713 Dietary counseling and surveillance: Secondary | ICD-10-CM | POA: Diagnosis not present

## 2018-08-16 DIAGNOSIS — Z6826 Body mass index (BMI) 26.0-26.9, adult: Secondary | ICD-10-CM | POA: Diagnosis not present

## 2018-08-16 DIAGNOSIS — H40011 Open angle with borderline findings, low risk, right eye: Secondary | ICD-10-CM | POA: Diagnosis not present

## 2018-08-16 DIAGNOSIS — H00021 Hordeolum internum right upper eyelid: Secondary | ICD-10-CM | POA: Diagnosis not present

## 2018-08-23 DIAGNOSIS — Z5181 Encounter for therapeutic drug level monitoring: Secondary | ICD-10-CM | POA: Diagnosis not present

## 2018-08-23 DIAGNOSIS — E11610 Type 2 diabetes mellitus with diabetic neuropathic arthropathy: Secondary | ICD-10-CM | POA: Diagnosis not present

## 2018-08-23 DIAGNOSIS — Z23 Encounter for immunization: Secondary | ICD-10-CM | POA: Diagnosis not present

## 2018-08-23 DIAGNOSIS — Z79891 Long term (current) use of opiate analgesic: Secondary | ICD-10-CM | POA: Diagnosis not present

## 2018-08-23 DIAGNOSIS — G894 Chronic pain syndrome: Secondary | ICD-10-CM | POA: Diagnosis not present

## 2018-08-30 DIAGNOSIS — E1142 Type 2 diabetes mellitus with diabetic polyneuropathy: Secondary | ICD-10-CM | POA: Diagnosis not present

## 2018-08-30 DIAGNOSIS — Z6825 Body mass index (BMI) 25.0-25.9, adult: Secondary | ICD-10-CM | POA: Diagnosis not present

## 2018-08-30 DIAGNOSIS — Z299 Encounter for prophylactic measures, unspecified: Secondary | ICD-10-CM | POA: Diagnosis not present

## 2018-08-30 DIAGNOSIS — I10 Essential (primary) hypertension: Secondary | ICD-10-CM | POA: Diagnosis not present

## 2018-08-30 DIAGNOSIS — E1165 Type 2 diabetes mellitus with hyperglycemia: Secondary | ICD-10-CM | POA: Diagnosis not present

## 2018-08-30 DIAGNOSIS — J449 Chronic obstructive pulmonary disease, unspecified: Secondary | ICD-10-CM | POA: Diagnosis not present

## 2018-09-20 DIAGNOSIS — Z79891 Long term (current) use of opiate analgesic: Secondary | ICD-10-CM | POA: Diagnosis not present

## 2018-09-20 DIAGNOSIS — G894 Chronic pain syndrome: Secondary | ICD-10-CM | POA: Diagnosis not present

## 2018-09-20 DIAGNOSIS — Z5181 Encounter for therapeutic drug level monitoring: Secondary | ICD-10-CM | POA: Diagnosis not present

## 2018-09-20 DIAGNOSIS — E11610 Type 2 diabetes mellitus with diabetic neuropathic arthropathy: Secondary | ICD-10-CM | POA: Diagnosis not present

## 2018-10-04 DIAGNOSIS — Z961 Presence of intraocular lens: Secondary | ICD-10-CM | POA: Diagnosis not present

## 2018-10-04 DIAGNOSIS — H40011 Open angle with borderline findings, low risk, right eye: Secondary | ICD-10-CM | POA: Diagnosis not present

## 2018-10-04 DIAGNOSIS — E7800 Pure hypercholesterolemia, unspecified: Secondary | ICD-10-CM | POA: Diagnosis not present

## 2018-10-04 DIAGNOSIS — E119 Type 2 diabetes mellitus without complications: Secondary | ICD-10-CM | POA: Diagnosis not present

## 2018-10-04 DIAGNOSIS — H02401 Unspecified ptosis of right eyelid: Secondary | ICD-10-CM | POA: Diagnosis not present

## 2018-10-04 DIAGNOSIS — J449 Chronic obstructive pulmonary disease, unspecified: Secondary | ICD-10-CM | POA: Diagnosis not present

## 2018-10-08 DIAGNOSIS — G894 Chronic pain syndrome: Secondary | ICD-10-CM | POA: Diagnosis not present

## 2018-10-08 DIAGNOSIS — Z79891 Long term (current) use of opiate analgesic: Secondary | ICD-10-CM | POA: Diagnosis not present

## 2018-10-08 DIAGNOSIS — E11610 Type 2 diabetes mellitus with diabetic neuropathic arthropathy: Secondary | ICD-10-CM | POA: Diagnosis not present

## 2018-10-08 DIAGNOSIS — Z5181 Encounter for therapeutic drug level monitoring: Secondary | ICD-10-CM | POA: Diagnosis not present

## 2018-11-15 DIAGNOSIS — Z79891 Long term (current) use of opiate analgesic: Secondary | ICD-10-CM | POA: Diagnosis not present

## 2018-11-15 DIAGNOSIS — Z5181 Encounter for therapeutic drug level monitoring: Secondary | ICD-10-CM | POA: Diagnosis not present

## 2018-11-15 DIAGNOSIS — E11610 Type 2 diabetes mellitus with diabetic neuropathic arthropathy: Secondary | ICD-10-CM | POA: Diagnosis not present

## 2018-11-15 DIAGNOSIS — G894 Chronic pain syndrome: Secondary | ICD-10-CM | POA: Diagnosis not present

## 2018-12-10 DIAGNOSIS — I10 Essential (primary) hypertension: Secondary | ICD-10-CM | POA: Diagnosis not present

## 2018-12-10 DIAGNOSIS — Z1339 Encounter for screening examination for other mental health and behavioral disorders: Secondary | ICD-10-CM | POA: Diagnosis not present

## 2018-12-10 DIAGNOSIS — E7800 Pure hypercholesterolemia, unspecified: Secondary | ICD-10-CM | POA: Diagnosis not present

## 2018-12-10 DIAGNOSIS — R5383 Other fatigue: Secondary | ICD-10-CM | POA: Diagnosis not present

## 2018-12-10 DIAGNOSIS — Z6825 Body mass index (BMI) 25.0-25.9, adult: Secondary | ICD-10-CM | POA: Diagnosis not present

## 2018-12-10 DIAGNOSIS — Z299 Encounter for prophylactic measures, unspecified: Secondary | ICD-10-CM | POA: Diagnosis not present

## 2018-12-10 DIAGNOSIS — Z1331 Encounter for screening for depression: Secondary | ICD-10-CM | POA: Diagnosis not present

## 2018-12-10 DIAGNOSIS — Z7189 Other specified counseling: Secondary | ICD-10-CM | POA: Diagnosis not present

## 2018-12-10 DIAGNOSIS — E1165 Type 2 diabetes mellitus with hyperglycemia: Secondary | ICD-10-CM | POA: Diagnosis not present

## 2018-12-10 DIAGNOSIS — E1142 Type 2 diabetes mellitus with diabetic polyneuropathy: Secondary | ICD-10-CM | POA: Diagnosis not present

## 2018-12-10 DIAGNOSIS — Z79899 Other long term (current) drug therapy: Secondary | ICD-10-CM | POA: Diagnosis not present

## 2018-12-10 DIAGNOSIS — Z125 Encounter for screening for malignant neoplasm of prostate: Secondary | ICD-10-CM | POA: Diagnosis not present

## 2018-12-10 DIAGNOSIS — Z0000 Encounter for general adult medical examination without abnormal findings: Secondary | ICD-10-CM | POA: Diagnosis not present

## 2018-12-14 DIAGNOSIS — G894 Chronic pain syndrome: Secondary | ICD-10-CM | POA: Diagnosis not present

## 2018-12-14 DIAGNOSIS — E11610 Type 2 diabetes mellitus with diabetic neuropathic arthropathy: Secondary | ICD-10-CM | POA: Diagnosis not present

## 2018-12-14 DIAGNOSIS — Z5181 Encounter for therapeutic drug level monitoring: Secondary | ICD-10-CM | POA: Diagnosis not present

## 2018-12-14 DIAGNOSIS — Z79891 Long term (current) use of opiate analgesic: Secondary | ICD-10-CM | POA: Diagnosis not present

## 2018-12-24 ENCOUNTER — Encounter: Payer: Self-pay | Admitting: Hematology

## 2018-12-24 DIAGNOSIS — R221 Localized swelling, mass and lump, neck: Secondary | ICD-10-CM | POA: Diagnosis not present

## 2018-12-24 DIAGNOSIS — R599 Enlarged lymph nodes, unspecified: Secondary | ICD-10-CM | POA: Diagnosis not present

## 2018-12-24 DIAGNOSIS — R590 Localized enlarged lymph nodes: Secondary | ICD-10-CM | POA: Diagnosis not present

## 2018-12-28 DIAGNOSIS — E1142 Type 2 diabetes mellitus with diabetic polyneuropathy: Secondary | ICD-10-CM | POA: Diagnosis not present

## 2018-12-28 DIAGNOSIS — F17210 Nicotine dependence, cigarettes, uncomplicated: Secondary | ICD-10-CM | POA: Diagnosis not present

## 2018-12-28 DIAGNOSIS — Z299 Encounter for prophylactic measures, unspecified: Secondary | ICD-10-CM | POA: Diagnosis not present

## 2018-12-28 DIAGNOSIS — I10 Essential (primary) hypertension: Secondary | ICD-10-CM | POA: Diagnosis not present

## 2018-12-28 DIAGNOSIS — E1165 Type 2 diabetes mellitus with hyperglycemia: Secondary | ICD-10-CM | POA: Diagnosis not present

## 2018-12-28 DIAGNOSIS — Z6825 Body mass index (BMI) 25.0-25.9, adult: Secondary | ICD-10-CM | POA: Diagnosis not present

## 2018-12-28 DIAGNOSIS — E7800 Pure hypercholesterolemia, unspecified: Secondary | ICD-10-CM | POA: Diagnosis not present

## 2018-12-28 DIAGNOSIS — C069 Malignant neoplasm of mouth, unspecified: Secondary | ICD-10-CM | POA: Diagnosis not present

## 2018-12-29 DIAGNOSIS — E119 Type 2 diabetes mellitus without complications: Secondary | ICD-10-CM | POA: Diagnosis not present

## 2018-12-29 DIAGNOSIS — J449 Chronic obstructive pulmonary disease, unspecified: Secondary | ICD-10-CM | POA: Diagnosis not present

## 2018-12-29 DIAGNOSIS — E7800 Pure hypercholesterolemia, unspecified: Secondary | ICD-10-CM | POA: Diagnosis not present

## 2019-01-03 ENCOUNTER — Other Ambulatory Visit: Payer: Self-pay | Admitting: Otolaryngology

## 2019-01-03 ENCOUNTER — Encounter (HOSPITAL_BASED_OUTPATIENT_CLINIC_OR_DEPARTMENT_OTHER): Payer: Self-pay | Admitting: *Deleted

## 2019-01-03 ENCOUNTER — Ambulatory Visit (INDEPENDENT_AMBULATORY_CARE_PROVIDER_SITE_OTHER): Payer: Medicare Other | Admitting: Otolaryngology

## 2019-01-03 ENCOUNTER — Other Ambulatory Visit: Payer: Self-pay

## 2019-01-03 DIAGNOSIS — R070 Pain in throat: Secondary | ICD-10-CM | POA: Diagnosis not present

## 2019-01-03 DIAGNOSIS — D3709 Neoplasm of uncertain behavior of other specified sites of the oral cavity: Secondary | ICD-10-CM

## 2019-01-03 NOTE — Anesthesia Preprocedure Evaluation (Addendum)
Anesthesia Evaluation  Patient identified by MRN, date of birth, ID band Patient awake    Reviewed: Allergy & Precautions, NPO status , Patient's Chart, lab work & pertinent test results  Airway Mallampati: II  TM Distance: >3 FB     Dental   Pulmonary COPD, Current Smoker,    breath sounds clear to auscultation       Cardiovascular hypertension,  Rhythm:Regular Rate:Normal     Neuro/Psych    GI/Hepatic Neg liver ROS, GERD  ,  Endo/Other  diabetes  Renal/GU      Musculoskeletal   Abdominal   Peds  Hematology   Anesthesia Other Findings   Reproductive/Obstetrics                            Anesthesia Physical Anesthesia Plan  ASA: III  Anesthesia Plan: MAC   Post-op Pain Management:    Induction: Intravenous  PONV Risk Score and Plan: Treatment may vary due to age or medical condition  Airway Management Planned: Nasal Cannula and Simple Face Mask  Additional Equipment:   Intra-op Plan:   Post-operative Plan:   Informed Consent: I have reviewed the patients History and Physical, chart, labs and discussed the procedure including the risks, benefits and alternatives for the proposed anesthesia with the patient or authorized representative who has indicated his/her understanding and acceptance.     Dental advisory given  Plan Discussed with: CRNA, Anesthesiologist and Surgeon  Anesthesia Plan Comments:         Anesthesia Quick Evaluation

## 2019-01-04 ENCOUNTER — Ambulatory Visit (HOSPITAL_BASED_OUTPATIENT_CLINIC_OR_DEPARTMENT_OTHER): Payer: Medicare Other | Admitting: Anesthesiology

## 2019-01-04 ENCOUNTER — Encounter (HOSPITAL_BASED_OUTPATIENT_CLINIC_OR_DEPARTMENT_OTHER): Payer: Self-pay

## 2019-01-04 ENCOUNTER — Encounter (HOSPITAL_BASED_OUTPATIENT_CLINIC_OR_DEPARTMENT_OTHER)
Admission: RE | Disposition: A | Discharge status: Home / Self Care | Payer: Self-pay | Source: Home / Self Care | Attending: Otolaryngology

## 2019-01-04 ENCOUNTER — Ambulatory Visit (HOSPITAL_BASED_OUTPATIENT_CLINIC_OR_DEPARTMENT_OTHER)
Admission: RE | Admit: 2019-01-04 | Discharge: 2019-01-04 | Disposition: A | Discharge status: Home / Self Care | Payer: Medicare Other | Attending: Otolaryngology | Admitting: Otolaryngology

## 2019-01-04 DIAGNOSIS — R591 Generalized enlarged lymph nodes: Secondary | ICD-10-CM | POA: Diagnosis not present

## 2019-01-04 DIAGNOSIS — J449 Chronic obstructive pulmonary disease, unspecified: Secondary | ICD-10-CM | POA: Diagnosis not present

## 2019-01-04 DIAGNOSIS — I10 Essential (primary) hypertension: Secondary | ICD-10-CM | POA: Diagnosis not present

## 2019-01-04 DIAGNOSIS — E119 Type 2 diabetes mellitus without complications: Secondary | ICD-10-CM | POA: Diagnosis not present

## 2019-01-04 DIAGNOSIS — F17200 Nicotine dependence, unspecified, uncomplicated: Secondary | ICD-10-CM | POA: Diagnosis not present

## 2019-01-04 DIAGNOSIS — D3709 Neoplasm of uncertain behavior of other specified sites of the oral cavity: Secondary | ICD-10-CM | POA: Diagnosis not present

## 2019-01-04 DIAGNOSIS — C049 Malignant neoplasm of floor of mouth, unspecified: Secondary | ICD-10-CM | POA: Diagnosis not present

## 2019-01-04 DIAGNOSIS — R221 Localized swelling, mass and lump, neck: Secondary | ICD-10-CM | POA: Diagnosis present

## 2019-01-04 DIAGNOSIS — K1379 Other lesions of oral mucosa: Secondary | ICD-10-CM | POA: Diagnosis not present

## 2019-01-04 HISTORY — DX: Chronic obstructive pulmonary disease, unspecified: J44.9

## 2019-01-04 HISTORY — PX: FLOOR OF MOUTH BIOPSY: SHX5834

## 2019-01-04 HISTORY — DX: Gastro-esophageal reflux disease without esophagitis: K21.9

## 2019-01-04 HISTORY — DX: Essential (primary) hypertension: I10

## 2019-01-04 HISTORY — DX: Major depressive disorder, single episode, unspecified: F32.9

## 2019-01-04 HISTORY — DX: Depression, unspecified: F32.A

## 2019-01-04 LAB — BASIC METABOLIC PANEL
Anion gap: 8 (ref 5–15)
BUN: 9 mg/dL (ref 8–23)
CO2: 23 mmol/L (ref 22–32)
Calcium: 9.5 mg/dL (ref 8.9–10.3)
Chloride: 107 mmol/L (ref 98–111)
Creatinine, Ser: 0.7 mg/dL (ref 0.61–1.24)
GFR calc Af Amer: >60 mL/min (ref >60)
GFR calc non Af Amer: >60 mL/min (ref >60)
Glucose, Bld: 159 mg/dL — ABNORMAL HIGH (ref 70–99)
Potassium: 3.9 mmol/L (ref 3.5–5.1)
Sodium: 138 mmol/L (ref 135–145)

## 2019-01-04 LAB — GLUCOSE, CAPILLARY: Glucose-Capillary: 159 mg/dL — ABNORMAL HIGH (ref 70–99)

## 2019-01-04 SURGERY — BIOPSY, MOUTH, FLOOR
Anesthesia: Monitor Anesthesia Care | Site: Mouth | Laterality: Left

## 2019-01-04 MED ORDER — LACTATED RINGERS IV SOLN
INTRAVENOUS | Status: DC
  Administered 2019-01-04: 09:00:00 via INTRAVENOUS

## 2019-01-04 MED ORDER — FENTANYL CITRATE (PF) 100 MCG/2ML IJ SOLN
25.0000 ug | INTRAMUSCULAR | Status: DC | PRN
  Administered 2019-01-04 (×2): 50 ug via INTRAVENOUS

## 2019-01-04 MED ORDER — FENTANYL CITRATE (PF) 100 MCG/2ML IJ SOLN
50.0000 ug | INTRAMUSCULAR | Status: DC | PRN
  Administered 2019-01-04: 50 ug via INTRAVENOUS

## 2019-01-04 MED ORDER — FENTANYL CITRATE (PF) 100 MCG/2ML IJ SOLN
INTRAMUSCULAR | Status: AC
  Filled 2019-01-04: qty 2

## 2019-01-04 MED ORDER — LIDOCAINE-EPINEPHRINE 1 %-1:100000 IJ SOLN
INTRAMUSCULAR | Status: DC | PRN
  Administered 2019-01-04: 1 mL

## 2019-01-04 MED ORDER — CLINDAMYCIN HCL 300 MG PO CAPS: 300.0000 mg | ORAL_CAPSULE | Freq: Three times a day (TID) | ORAL | 0 refills | Status: AC

## 2019-01-04 MED ORDER — ONDANSETRON HCL 4 MG/2ML IJ SOLN
INTRAMUSCULAR | Status: DC | PRN
  Administered 2019-01-04: 4 mg via INTRAVENOUS

## 2019-01-04 MED ORDER — SCOPOLAMINE 1 MG/3DAYS TD PT72: 1.0000 | MEDICATED_PATCH | Freq: Once | TRANSDERMAL | Status: DC | PRN

## 2019-01-04 MED ORDER — MIDAZOLAM HCL 2 MG/2ML IJ SOLN: 1.0000 mg | INTRAMUSCULAR | Status: DC | PRN

## 2019-01-04 SURGICAL SUPPLY — 34 items
BLADE SURG 15 STRL LF DISP TIS (BLADE) ×1 IMPLANT
BLADE SURG 15 STRL SS (BLADE) ×1
CANISTER SUCT 1200ML W/VALVE (MISCELLANEOUS) IMPLANT
COVER MAYO STAND STRL (DRAPES) ×2 IMPLANT
COVER WAND RF STERILE (DRAPES) IMPLANT
ELECT COATED BLADE 2.86 ST (ELECTRODE) IMPLANT
ELECT NEEDLE BLADE 2-5/6 (NEEDLE) ×2 IMPLANT
ELECT REM PT RETURN 9FT ADLT (ELECTROSURGICAL) ×2
ELECT REM PT RETURN 9FT PED (ELECTROSURGICAL)
ELECTRODE REM PT RETRN 9FT PED (ELECTROSURGICAL) IMPLANT
ELECTRODE REM PT RTRN 9FT ADLT (ELECTROSURGICAL) ×1 IMPLANT
GLOVE BIO SURGEON STRL SZ 6.5 (GLOVE) ×2 IMPLANT
GLOVE BIO SURGEON STRL SZ7 (GLOVE) ×2 IMPLANT
GLOVE BIO SURGEON STRL SZ7.5 (GLOVE) ×2 IMPLANT
GLOVE BIOGEL PI IND STRL 7.0 (GLOVE) ×1 IMPLANT
GLOVE BIOGEL PI IND STRL 7.5 (GLOVE) ×1 IMPLANT
GLOVE BIOGEL PI INDICATOR 7.0 (GLOVE) ×1
GLOVE BIOGEL PI INDICATOR 7.5 (GLOVE) ×1
GOWN STRL REUS W/ TWL XL LVL3 (GOWN DISPOSABLE) ×2 IMPLANT
GOWN STRL REUS W/TWL XL LVL3 (GOWN DISPOSABLE) ×2
NEEDLE PRECISIONGLIDE 27X1.5 (NEEDLE) ×2 IMPLANT
PACK BASIN DAY SURGERY FS (CUSTOM PROCEDURE TRAY) ×2 IMPLANT
PENCIL BUTTON HOLSTER BLD 10FT (ELECTRODE) ×2 IMPLANT
PENCIL FOOT CONTROL (ELECTRODE) IMPLANT
SHEET MEDIUM DRAPE 40X70 STRL (DRAPES) ×2 IMPLANT
SPONGE NEURO XRAY DETECT 1X3 (DISPOSABLE) IMPLANT
SPONGE TONSIL TAPE 1.25 RFD (DISPOSABLE) IMPLANT
SUCTION FRAZIER HANDLE 10FR (MISCELLANEOUS)
SUCTION TUBE FRAZIER 10FR DISP (MISCELLANEOUS) IMPLANT
SUT CHROMIC 5 0 P 3 (SUTURE) IMPLANT
SYR CONTROL 10ML LL (SYRINGE) ×2 IMPLANT
TOWEL GREEN STERILE FF (TOWEL DISPOSABLE) ×2 IMPLANT
TUBE CONNECTING 20X1/4 (TUBING) IMPLANT
YANKAUER SUCT BULB TIP NO VENT (SUCTIONS) IMPLANT

## 2019-01-04 NOTE — Anesthesia Postprocedure Evaluation (Signed)
Anesthesia Post Note  Patient: CHANEY MACLAREN  Procedure(s) Performed: FLOOR OF MOUTH BIOPSY (Left Mouth)     Patient location during evaluation: PACU Anesthesia Type: MAC Level of consciousness: awake and alert Pain management: pain level controlled Vital Signs Assessment: post-procedure vital signs reviewed and stable Respiratory status: spontaneous breathing, nonlabored ventilation and respiratory function stable Cardiovascular status: blood pressure returned to baseline and stable Postop Assessment: no apparent nausea or vomiting Anesthetic complications: no    Last Vitals:  Vitals:   01/04/19 1045 01/04/19 1100  BP: (!) 146/74 (!) 154/85  Pulse: 64 71  Resp: 11 16  Temp:    SpO2: 94% 94%    Last Pain:  Vitals:   01/04/19 1100  TempSrc:   PainSc: 5                  Lidia Collum

## 2019-01-04 NOTE — Op Note (Signed)
DATE OF PROCEDURE:  01/04/2019                              OPERATIVE REPORT  SURGEON:  Leta Baptist, MD  PREOPERATIVE DIAGNOSES: 1. Left floor of mouth mass  POSTOPERATIVE DIAGNOSES: 1. Left floor of mouth mass  PROCEDURE PERFORMED:  Biopsy of the floor of mouth mass  ANESTHESIA: Local anesthesia with IV sedation.  COMPLICATIONS:  None.  ESTIMATED BLOOD LOSS:  Minimal.  INDICATION FOR PROCEDURE:  Lance [name] is a 68 y.o. male with an ulcerative mass of the left floor of mouth. He also has a left level I neck mass. The exam findings were concerning for metastatic squamous cell carcinoma.  The decision was made for the patient to undergo the biopsy procedure.  The risks, benefits, alternatives, and details of the procedure were discussed with the patient.  Questions were invited and answered.  Informed consent was obtained.  DESCRIPTION:  The patient was taken to the operating room and placed supine on the operating table.  IV sedation was provided by the anesthesiologist. The patient was positioned and prepped and draped in a standard fashion for oral surgery.  Examination of the oral cavity revealed a large ulcerative mass along the left floor of mouth. 1% lidocaine with 1-100,000 epinephrine was infiltrated at the biopsy site. 4 biopsy specimens were obtained from the floor of mouth mass with the Tru-Cut forceps. The biopsy specimens were sent to the pathology department for permanent histologic identification.  The care of the patient was turned over to the anesthesiologist.  The patient was awakened from anesthesia without difficulty.  The patient was extubated and transferred to the recovery room in good condition.  OPERATIVE FINDINGS:  Left floor of mouth mass.  SPECIMEN:  Floor of mouth mass biopsy specimen.  FOLLOWUP CARE:  The patient will be discharged home once awake and alert.  The patient will follow up in my office in approximately 1 wees.  [name] 01/04/2019 10:47  AM

## 2019-01-04 NOTE — Transfer of Care (Signed)
Immediate Anesthesia Transfer of Care Note  Patient: Lance [name]  Procedure(s) Performed: Lanny Cramp OF MOUTH BIOPSY (N/A )  Patient Location: PACU  Anesthesia Type:MAC  Level of Consciousness: awake, alert , oriented and patient cooperative  Airway & Oxygen Therapy: Patient Spontanous Breathing and Patient connected to face mask oxygen  Post-op Assessment: Report given to RN and Post -op Vital signs reviewed and stable  Post vital signs: Reviewed and stable  Last Vitals:  Vitals Value Taken Time  BP    Temp    Pulse    Resp    SpO2      Last Pain:  Vitals:   01/04/19 0829  TempSrc: Oral  PainSc: 0-No pain         Complications: No apparent anesthesia complications

## 2019-01-04 NOTE — Discharge Instructions (Signed)
The patient may resume all his previous activities and diet. He will follow-up in my office in one week.

## 2019-01-04 NOTE — Anesthesia Procedure Notes (Signed)
Procedure Name: MAC Date/Time: 01/04/2019 10:19 AM Performed by: Signe Colt, CRNA Pre-anesthesia Checklist: Patient identified, Emergency Drugs available, Suction available, Patient being monitored and Timeout performed Patient Re-evaluated:Patient Re-evaluated prior to induction Oxygen Delivery Method: Simple face mask

## 2019-01-04 NOTE — H&P (Signed)
Cc: Floor of mouth and neck mass  HPI: The patient is a 69 y/o male who presents today for evaluation of tongue pain. He was last seen in 2018 with similar complaints. At that time, he was noted to have a normal ENT evaluation. Neck CT was also obtained which showed no evidence of lesion. The patient has noted progressive tongue pain over the past year. He is barely able to eat or drink. The patient noted a left neck mass 4 weeks ago. He underwent a repeat CT which showed a mass along the floor of the mouth, extending along the jawline. A suspicious level I lymph node was also noted. The patient is a 50+ pack year smoker. He is currently in pain management but his currently pain is not controlled with his regular medication regimen. No other ENT, GI, or respiratory issue noted since the last visit.   Exam: General: Communicates without difficulty, well nourished, no acute distress. Head: Normocephalic, no evidence injury, no tenderness, facial buttresses intact without stepoff. Eyes: PERRL, EOMI. No scleral icterus, conjunctivae clear. Prosthetic eye. Neuro: CN II exam reveals vision grossly intact. No nystagmus at any point of gaze. Ears: Auricles well formed without lesions. Ear canals are intact without mass or lesion. No erythema or edema is appreciated. The TMs are intact without fluid. Nose: External evaluation reveals normal support and skin without lesions. Dorsum is intact. Anterior rhinoscopy reveals healthy pink mucosa over anterior aspect of inferior turbinates and intact septum. No purulence noted. Oral:  Oral cavity and oropharynx are intact. Palpable, ulcerative mass noted along the floor of the mouth. Area is very tender to palpation. Neck: Full range of motion without pain. Firm level I cervical lymph node. Thyroid bed within normal limits to palpation. Parotid glands and submandibular glands equal bilaterally without mass. Trachea is midline. Neuro:  CN 2-12 grossly intact. Gait normal.  Vestibular: No nystagmus at any point of gaze. The cerebellar examination is unremarkable.   Procedure:  Flexible Fiberoptic Laryngoscopy -- Risks, benefits, and alternatives of flexible endoscopy were explained to the patient. Specific mention was made of the risk of throat numbness with difficulty swallowing, possible bleeding from the nose and mouth, and pain from the procedure. The patient gave oral consent to proceed. The nasal cavities were decongested and anesthetised with a combination of oxymetazoline and 4% lidocaine solution. The flexible scope was inserted into the right nasal cavity and advanced towards the nasopharynx. Visualized mucosa over the turbinates and septum were as described above. The nasopharynx was clear. Oropharyngeal walls were symmetric and mobile without lesion, mass, or edema. Hypopharynx was also without  lesion or edema. Larynx was mobile without lesions. Supraglottic structures were free of edema, mass, and asymmetry. True vocal folds were white without mass or lesion. Base of tongue was within normal limits. The patient tolerated the procedure well.   Assessment  1. An ulcerative mass is noted along the floor of the mouth with level I lymphadenopathy. Findings are suggestive of metastatic squamous cell carcinoma.  2. No other suspicious mass or lesion is noted on today's fiberoptic laryngoscopy exam.  Plan 1. The physical exam, laryngoscopy, and CT findings are extensively reviewed with the patient and his family. 2. Recommend biopsy of the lesion for definitive diagnosis and treatment planning. The risks, benefits, alternatives, and details of the procedure are reviewed with the patient. Questions are invited and answered. 3. The procedure will be scheduled as soon as possible.

## 2019-01-05 ENCOUNTER — Encounter (HOSPITAL_BASED_OUTPATIENT_CLINIC_OR_DEPARTMENT_OTHER): Payer: Self-pay | Admitting: Otolaryngology

## 2019-01-06 ENCOUNTER — Encounter (HOSPITAL_COMMUNITY): Payer: Self-pay | Admitting: *Deleted

## 2019-01-07 ENCOUNTER — Encounter (HOSPITAL_COMMUNITY): Payer: Self-pay | Admitting: Lab

## 2019-01-07 ENCOUNTER — Encounter (HOSPITAL_COMMUNITY): Payer: Self-pay | Admitting: *Deleted

## 2019-01-07 ENCOUNTER — Other Ambulatory Visit (HOSPITAL_COMMUNITY): Payer: Self-pay | Admitting: *Deleted

## 2019-01-07 DIAGNOSIS — C069 Malignant neoplasm of mouth, unspecified: Secondary | ICD-10-CM

## 2019-01-07 NOTE — Progress Notes (Signed)
Oncology Navigator Note:  Patient was referred to our office from Dr. Benjamine Mola. I called patient today to introduce myself and provide information in how I will be involved with his care.  I provided information to him on his first visit and what to expect.  I made sure patient was aware of appointment time and directions to the cancer center.  I advised him that we would be scheduling additional appointments for scans and procedures and he verbalizes understanding and acceptance.   My phone number was given so that he can call me with any questions or concerns.  He voices appreciation and understanding.

## 2019-01-07 NOTE — Progress Notes (Unsigned)
Referral sent to [facility].  Records faxed on 1/31

## 2019-01-07 NOTE — Progress Notes (Signed)
I spoke with Dr. Benjamine Mola today and he states that he is sending patient to see Dr. Conley Canal at [facility] just for a surgical consult. He wanted him to be established should he need surgical interventions in the future.  Dr. Benjamine Mola agrees with [place] proceeding with treatment at Dr. Tomie China discretion.

## 2019-01-10 ENCOUNTER — Encounter (HOSPITAL_COMMUNITY): Payer: Self-pay

## 2019-01-10 ENCOUNTER — Encounter
Admission: RE | Admit: 2019-01-10 | Discharge: 2019-01-10 | Disposition: A | Discharge status: Home / Self Care | Payer: Medicare Other | Source: Ambulatory Visit | Attending: Hematology | Admitting: Hematology

## 2019-01-10 ENCOUNTER — Encounter (HOSPITAL_COMMUNITY)
Admission: RE | Admit: 2019-01-10 | Discharge: 2019-01-10 | Disposition: A | Discharge status: Home / Self Care | Payer: Medicare Other | Source: Ambulatory Visit | Attending: General Surgery | Admitting: General Surgery

## 2019-01-10 ENCOUNTER — Other Ambulatory Visit: Payer: Self-pay

## 2019-01-10 DIAGNOSIS — C069 Malignant neoplasm of mouth, unspecified: Secondary | ICD-10-CM | POA: Insufficient documentation

## 2019-01-10 DIAGNOSIS — M109 Gout, unspecified: Secondary | ICD-10-CM | POA: Diagnosis not present

## 2019-01-10 DIAGNOSIS — E440 Moderate protein-calorie malnutrition: Secondary | ICD-10-CM | POA: Diagnosis not present

## 2019-01-10 DIAGNOSIS — Z01812 Encounter for preprocedural laboratory examination: Secondary | ICD-10-CM | POA: Insufficient documentation

## 2019-01-10 DIAGNOSIS — J449 Chronic obstructive pulmonary disease, unspecified: Secondary | ICD-10-CM | POA: Diagnosis not present

## 2019-01-10 DIAGNOSIS — E119 Type 2 diabetes mellitus without complications: Secondary | ICD-10-CM | POA: Diagnosis not present

## 2019-01-10 DIAGNOSIS — C109 Malignant neoplasm of oropharynx, unspecified: Secondary | ICD-10-CM | POA: Diagnosis not present

## 2019-01-10 DIAGNOSIS — C049 Malignant neoplasm of floor of mouth, unspecified: Secondary | ICD-10-CM | POA: Diagnosis not present

## 2019-01-10 DIAGNOSIS — Z7984 Long term (current) use of oral hypoglycemic drugs: Secondary | ICD-10-CM | POA: Diagnosis not present

## 2019-01-10 DIAGNOSIS — Z79899 Other long term (current) drug therapy: Secondary | ICD-10-CM | POA: Diagnosis not present

## 2019-01-10 DIAGNOSIS — M1990 Unspecified osteoarthritis, unspecified site: Secondary | ICD-10-CM | POA: Diagnosis not present

## 2019-01-10 DIAGNOSIS — F17210 Nicotine dependence, cigarettes, uncomplicated: Secondary | ICD-10-CM | POA: Diagnosis not present

## 2019-01-10 DIAGNOSIS — Z6823 Body mass index (BMI) 23.0-23.9, adult: Secondary | ICD-10-CM | POA: Diagnosis not present

## 2019-01-10 DIAGNOSIS — I10 Essential (primary) hypertension: Secondary | ICD-10-CM | POA: Diagnosis not present

## 2019-01-10 DIAGNOSIS — F329 Major depressive disorder, single episode, unspecified: Secondary | ICD-10-CM | POA: Diagnosis not present

## 2019-01-10 DIAGNOSIS — K219 Gastro-esophageal reflux disease without esophagitis: Secondary | ICD-10-CM | POA: Diagnosis not present

## 2019-01-10 DIAGNOSIS — I700 Atherosclerosis of aorta: Secondary | ICD-10-CM | POA: Diagnosis not present

## 2019-01-10 LAB — CBC WITH DIFFERENTIAL/PLATELET
Abs Immature Granulocytes: 0.01 10*3/uL (ref 0.00–0.07)
Basophils Absolute: 0 10*3/uL (ref 0.0–0.1)
Basophils Relative: 1 %
Eosinophils Absolute: 0.2 10*3/uL (ref 0.0–0.5)
Eosinophils Relative: 4 %
HCT: 46.9 % (ref 39.0–52.0)
Hemoglobin: 14.9 g/dL (ref 13.0–17.0)
Immature Granulocytes: 0 %
Lymphocytes Relative: 16 %
Lymphs Abs: 1.1 10*3/uL (ref 0.7–4.0)
MCH: 30.7 pg (ref 26.0–34.0)
MCHC: 31.8 g/dL (ref 30.0–36.0)
MCV: 96.7 fL (ref 80.0–100.0)
Monocytes Absolute: 0.6 10*3/uL (ref 0.1–1.0)
Monocytes Relative: 9 %
Neutro Abs: 4.7 10*3/uL (ref 1.7–7.7)
Neutrophils Relative %: 70 %
Platelets: 220 10*3/uL (ref 150–400)
RBC: 4.85 MIL/uL (ref 4.22–5.81)
RDW: 13.2 % (ref 11.5–15.5)
WBC: 6.6 10*3/uL (ref 4.0–10.5)
nRBC: 0 % (ref 0.0–0.2)

## 2019-01-10 LAB — HEMOGLOBIN A1C
Hgb A1c MFr Bld: 7 % — ABNORMAL HIGH (ref 4.8–5.6)
Mean Plasma Glucose: 154.2 mg/dL

## 2019-01-10 LAB — GLUCOSE, CAPILLARY
Glucose-Capillary: 160 mg/dL — ABNORMAL HIGH (ref 70–99)
Glucose-Capillary: 113 mg/dL — ABNORMAL HIGH (ref 70–99)

## 2019-01-10 MED ORDER — FLUDEOXYGLUCOSE F - 18 (FDG) INJECTION
9.0000 | Freq: Once | INTRAVENOUS | Status: AC | PRN
  Administered 2019-01-10: 8.67 via INTRAVENOUS

## 2019-01-10 NOTE — Patient Instructions (Signed)
Lance [name]  01/10/2019     @PREFPERIOPPHARMACY @   Your procedure is scheduled on  01/12/2019.  Report to [facility] at  810   A.M.  Call this number if you have problems the morning of surgery:  [phone]   Remember:  Do not eat or drink after midnight.                       Take these medicines the morning of surgery with A SIP OF WATER  Colchicine, neurontin, lisinopril, prilosec, oxycodone. Use your inhaler before you come.    Do not wear jewelry, make-up or nail polish.  Do not wear lotions, powders, or perfumes, or deodorant.  Do not shave 48 hours prior to surgery.  Men may shave face and neck.  Do not bring valuables to the hospital.  [facility] is not responsible for any belongings or valuables.  Contacts, dentures or bridgework may not be worn into surgery.  Leave your suitcase in the car.  After surgery it may be brought to your room.  For patients admitted to the hospital, discharge time will be determined by your treatment team.  Patients discharged the day of surgery will not be allowed to drive home.   Name and phone number of your driver:   family Special instructions:  None  Please read over the following fact sheets that you were given. Anesthesia Post-op Instructions and Care and Recovery After Surgery       Implanted Port Insertion Implanted port insertion is a procedure to put in a port and catheter. The port is a device with an injectable disk that can be accessed by your health care provider. The port is connected to a vein in the chest or neck by a small flexible tube (catheter). There are different types of ports. The implanted port may be used as a long-term IV access for:  Medicines, such as chemotherapy.  Fluids.  Liquid nutrition, such as total parenteral nutrition (TPN). When you have a port, this means that your health care provider will not need to use the veins in your arms for these procedures. Tell a health  care provider about:  Any allergies you have.  All medicines you are taking, especially blood thinners, as well as any vitamins, herbs, eye drops, creams, over-the-counter medicines, and steroids.  Any problems you or family members have had with anesthetic medicines.  Any blood disorders you have.  Any surgeries you have had.  Any medical conditions you have or have had, including diabetes or kidney problems.  Whether you are pregnant or may be pregnant. What are the risks? Generally, this is a safe procedure. However, problems may occur, including:  Allergic reactions to medicines or dyes.  Damage to other structures or organs.  Infection.  Damage to the blood vessel, bruising, or bleeding at the puncture site.  Blood clot.  Breakdown of the skin over the port.  A collection of air in the chest that can cause one of the lungs to collapse (pneumothorax). This is rare. What happens before the procedure? Medicines  Ask your health care provider about: ? Changing or stopping your regular medicines. This is especially important if you are taking diabetes medicines or blood thinners. ? Taking medicines such as aspirin and ibuprofen. These medicines can thin your blood. Do not take these medicines unless your health care provider tells you to take  them. ? Taking over-the-counter medicines, vitamins, herbs, and supplements. Staying hydrated Follow instructions from your health care provider about hydration, which may include:  Up to 2 hours before the procedure - you may continue to drink clear liquids, such as water, clear fruit juice, black coffee, and plain tea.  Eating and drinking restrictions  Follow instructions from your health care provider about eating and drinking, which may include: ? 8 hours before the procedure - stop eating heavy meals or foods, such as meat, fried foods, or fatty foods. ? 6 hours before the procedure - stop eating light meals or foods, such as  toast or cereal. ? 6 hours before the procedure - stop drinking milk or drinks that contain milk. ? 2 hours before the procedure - stop drinking clear liquids. General instructions  Plan to have someone take you home from the hospital or clinic.  If you will be going home right after the procedure, plan to have someone with you for 24 hours.  You may have blood tests.  Do not use any products that contain nicotine or tobacco for at least 4-6 weeks before the procedure. These products include cigarettes, e-cigarettes, and chewing tobacco. If you need help quitting, ask your health care provider.  Ask your health care provider what steps will be taken to help prevent infection. These may include: ? Removing hair at the surgery site. ? Washing skin with a germ-killing soap. ? Taking antibiotic medicine. What happens during the procedure?   An IV will be inserted into one of your veins.  You will be given one or more of the following: ? A medicine to help you relax (sedative). ? A medicine to numb the area (local anesthetic).  Two small incisions will be made to insert the port. ? One smaller incision will be made in your neck to get access to the vein where the catheter will lie. ? The other incision will be made in the upper chest. This is where the port will lie.  The procedure may be done using continuous X-ray (fluoroscopy) or other imaging tools for guidance.  The port and catheter will be placed. There may be a small, raised area where the port is.  The port will be flushed with a salt solution (saline), and blood will be drawn to make sure that it is working correctly.  The incisions will be closed.  Bandages (dressings) may be placed over the incisions. The procedure may vary among health care providers and hospitals. What happens after the procedure?  Your blood pressure, heart rate, breathing rate, and blood oxygen level will be monitored until you leave the hospital  or clinic.  Do not drive for 24 hours if you were given a sedative during your procedure.  You will be given a manufacturer's information card for the type of port that you have. Keep this with you.  Your port will need to be flushed and checked as told by your health care provider, usually every few weeks.  A chest X-ray will be done to: ? Check the placement of the port. ? Make sure there is no injury to your lung. Summary  Implanted port insertion is a procedure to put in a port and catheter.  The implanted port is used as a long-term IV access.  The port will need to be flushed and checked as told by your health care provider, usually every few weeks.  Keep your manufacturer's information card with you at all times. This information  is not intended to replace advice given to you by your health care provider. Make sure you discuss any questions you have with your health care provider. Document Released: 09/14/2013 Document Revised: 06/22/2018 Document Reviewed: 06/22/2018 Elsevier Interactive Patient Education  2019 [place] Insertion, Care After This sheet gives you information about how to care for yourself after your procedure. Your health care provider may also give you more specific instructions. If you have problems or questions, contact your health care provider. What can I expect after the procedure? After the procedure, it is common to have:  Discomfort at the port insertion site.  Bruising on the skin over the port. This should improve over 3-4 days. Follow these instructions at home: [facility] care  After your port is placed, you will get a manufacturer's information card. The card has information about your port. Keep this card with you at all times.  Take care of the port as told by your health care provider. Ask your health care provider if you or a family member can get training for taking care of the port at home. A home health care nurse may  also take care of the port.  Make sure to remember what type of port you have. Incision care      Follow instructions from your health care provider about how to take care of your port insertion site. Make sure you: ? Wash your hands with soap and water before and after you change your bandage (dressing). If soap and water are not available, use hand sanitizer. ? Change your dressing as told by your health care provider. ? Leave stitches (sutures), skin glue, or adhesive strips in place. These skin closures may need to stay in place for 2 weeks or longer. If adhesive strip edges start to loosen and curl up, you may trim the loose edges. Do not remove adhesive strips completely unless your health care provider tells you to do that.  Check your port insertion site every day for signs of infection. Check for: ? Redness, swelling, or pain. ? Fluid or blood. ? Warmth. ? Pus or a bad smell. Activity  Return to your normal activities as told by your health care provider. Ask your health care provider what activities are safe for you.  Do not lift anything that is heavier than 10 lb (4.5 kg), or the limit that you are told, until your health care provider says that it is safe. General instructions  Take over-the-counter and prescription medicines only as told by your health care provider.  Do not take baths, swim, or use a hot tub until your health care provider approves. Ask your health care provider if you may take showers. You may only be allowed to take sponge baths.  Do not drive for 24 hours if you were given a sedative during your procedure.  Wear a medical alert bracelet in case of an emergency. This will tell any health care providers that you have a port.  Keep all follow-up visits as told by your health care provider. This is important. Contact a health care provider if:  You cannot flush your port with saline as directed, or you cannot draw blood from the port.  You have a  fever or chills.  You have redness, swelling, or pain around your port insertion site.  You have fluid or blood coming from your port insertion site.  Your port insertion site feels warm to the touch.  You have pus or  a bad smell coming from the port insertion site. Get help right away if:  You have chest pain or shortness of breath.  You have bleeding from your port that you cannot control. Summary  Take care of the port as told by your health care provider. Keep the manufacturer's information card with you at all times.  Change your dressing as told by your health care provider.  Contact a health care provider if you have a fever or chills or if you have redness, swelling, or pain around your port insertion site.  Keep all follow-up visits as told by your health care provider. This information is not intended to replace advice given to you by your health care provider. Make sure you discuss any questions you have with your health care provider. Document Released: 09/14/2013 Document Revised: 06/22/2018 Document Reviewed: 06/22/2018 Elsevier Interactive Patient Education  2019 [place] An implanted port is a device that is placed under the skin. It is usually placed in the chest. The device can be used to give IV medicine, to take blood, or for dialysis. You may have an implanted port if:  You need IV medicine that would be irritating to the small veins in your hands or arms.  You need IV medicines, such as antibiotics, for a long period of time.  You need IV nutrition for a long period of time.  You need dialysis. Having a port means that your health care provider will not need to use the veins in your arms for these procedures. You may have fewer limitations when using a port than you would if you used other types of long-term IVs, and you will likely be able to return to normal activities after your incision heals. An implanted port has two  main parts:  Reservoir. The reservoir is the part where a needle is inserted to give medicines or draw blood. The reservoir is round. After it is placed, it appears as a small, raised area under your skin.  Catheter. The catheter is a thin, flexible tube that connects the reservoir to a vein. Medicine that is inserted into the reservoir goes into the catheter and then into the vein. How is my port accessed? To access your port:  A numbing cream may be placed on the skin over the port site.  Your health care provider will put on a mask and sterile gloves.  The skin over your port will be cleaned carefully with a germ-killing soap and allowed to dry.  Your health care provider will gently pinch the port and insert a needle into it.  Your health care provider will check for a blood return to make sure the port is in the vein and is not clogged.  If your port needs to remain accessed to get medicine continuously (constant infusion), your health care provider will place a clear bandage (dressing) over the needle site. The dressing and needle will need to be changed every week, or as told by your health care provider. What is flushing? Flushing helps keep the port from getting clogged. Follow instructions from your health care provider about how and when to flush the port. Ports are usually flushed with saline solution or a medicine called heparin. The need for flushing will depend on how the port is used:  If the port is only used from time to time to give medicines or draw blood, the port may need to be flushed: ? Before and after medicines have  been given. ? Before and after blood has been drawn. ? As part of routine maintenance. Flushing may be recommended every 4-6 weeks.  If a constant infusion is running, the port may not need to be flushed.  Throw away any syringes in a disposal container that is meant for sharp items (sharps container). You can buy a sharps container from a pharmacy,  or you can make one by using an empty hard plastic bottle with a cover. How long will my port stay implanted? The port can stay in for as long as your health care provider thinks it is needed. When it is time for the port to come out, a surgery will be done to remove it. The surgery will be similar to the procedure that was done to put the port in. Follow these instructions at home:   Flush your port as told by your health care provider.  If you need an infusion over several days, follow instructions from your health care provider about how to take care of your port site. Make sure you: ? Wash your hands with soap and water before you change your dressing. If soap and water are not available, use alcohol-based hand sanitizer. ? Change your dressing as told by your health care provider. ? Place any used dressings or infusion bags into a plastic bag. Throw that bag in the trash. ? Keep the dressing that covers the needle clean and dry. Do not get it wet. ? Do not use scissors or sharp objects near the tube. ? Keep the tube clamped, unless it is being used.  Check your port site every day for signs of infection. Check for: ? Redness, swelling, or pain. ? Fluid or blood. ? Pus or a bad smell.  Protect the skin around the port site. ? Avoid wearing bra straps that rub or irritate the site. ? Protect the skin around your port from seat belts. Place a soft pad over your chest if needed.  Bathe or shower as told by your health care provider. The site may get wet as long as you are not actively receiving an infusion.  Return to your normal activities as told by your health care provider. Ask your health care provider what activities are safe for you.  Carry a medical alert card or wear a medical alert bracelet at all times. This will let health care providers know that you have an implanted port in case of an emergency. Get help right away if:  You have redness, swelling, or pain at the port  site.  You have fluid or blood coming from your port site.  You have pus or a bad smell coming from the port site.  You have a fever. Summary  Implanted ports are usually placed in the chest for long-term IV access.  Follow instructions from your health care provider about flushing the port and changing bandages (dressings).  Take care of the area around your port by avoiding clothing that puts pressure on the area, and by watching for signs of infection.  Protect the skin around your port from seat belts. Place a soft pad over your chest if needed.  Get help right away if you have a fever or you have redness, swelling, pain, drainage, or a bad smell at the port site. This information is not intended to replace advice given to you by your health care provider. Make sure you discuss any questions you have with your health care provider. Document Released: 11/24/2005 Document  Revised: 12/27/2016 Document Reviewed: 12/27/2016 Elsevier Interactive Patient Education  2019 [employer].  How to Care for a Feeding Tube  A feeding tube is a tube used to give medicine, water, and liquid food. A person may have this tube if she or he has trouble swallowing or cannot take food or medicine. Supplies needed to care for the tube site:  Clean gloves.  Clean washcloth, gauze pads, or soft paper towel.  Cotton swabs.  A skin barrier ointment or cream, such as petroleum jelly.  Soap and water.  Pre-cut foam pads or gauze (for around the tube).  Tube tape.  An anchoring device (optional). How to care for the tube site  1. Have all supplies ready and close to you. 2. Wash your hands. 3. Put on gloves. 4. Change any pad or gauze near the tube if: ? It is dirty. ? It is wet. ? It has been there for more than one day. 5. Check the skin around the tube. Call the doctor if you see any of these: ? Red skin. ? A rash. ? Swelling. ? Leaking fluid. ? Extra skin. 6. Dip the gauze and  cotton swabs in water and soap. 7. Use the cotton swabs to wipe the skin that is closest to the tube. 8. Use the gauze pads to wipe the rest of the skin near the tube. 9. Rinse with water. 10. Dry the area with a clean washcloth, dry gauze pad, or soft paper towel. 11. If the skin is red, use a cotton swab to put on a skin barrier cream or ointment. Put it on by making little circles. Do not apply antibiotic ointments at the tube site. 12. Put a new pre-cut foam pad or gauze around the tube. If there is no fluid at the tube site, you do not need a pad or gauze. 13. Tape down the edges. 14. Use tape or an anchoring device to attach the tube to the skin. Do this for comfort or as told. Each time you use tape, put it in a different place. 15. Sit the person up. 16. Throw away used supplies. 17. Take off your gloves. 18. Wash your hands. Supplies needed to flush a feeding tube:  Clean gloves.  A clean 60 mL syringe that connects to the feeding tube.  A towel.  Germ-free (sterile) or purified water. Follow these rules: ? Use germ-free water if:  Your body's defense system (immune system) is weak and you have trouble getting better from infections (are immunocompromised).  You do not know how many chemicals are in your water. ? Do not use water from lakes or other bodies of water unless you treat it or filter it first. ? To make drinking water pure by boiling:  Boil water for 1 minute or longer. Keep a lid over the water while it boils.  Let the water cool off to room temperature before you use it. How to flush a feeding tube 1. Have all supplies ready and close to you. 2. Wash your hands. 3. Put on gloves. 4. Pull 30 mL of water into the syringe. 5. Before you push water into (flush) the tube, put the towel under the tube to catch any fluid leaks. 6. Bend (kink) the feeding tube while you do one of these things: ? Disconnect it from the feeding-bag tubing. ? Take off the cap at  the end of the tube. 7. Put the tip of the syringe into the end of the feeding  tube. 8. Stop bending the tube. 9. Use the syringe to slowly put the water in the tube. If the water will not go in the tube: ? Have the person lie on his or her left side. ? Try putting the water in the tube again. ? Do not push hard to make the water go in. 10. Take out the syringe and put the cap on the tube. 11. Throw away used supplies. 12. Take off your gloves. 13. Wash your hands. Follow these instructions at home: Caring for the tube  If the person has a foam pad or gauze near the tube, change it: ? Every day. ? When it is dirty. ? When it is wet.  Do not put antibiotic ointments by the tube. Flushing the tube  Do not use a syringe that is smaller than 60 mL.  Flush the tube at all of these times: ? Before you give medicine. ? Between medicines. ? After the person gets the last medicine before a feeding.  Do not mix medicines with formula. Do not mix medicines with other medicines.  Completely flush medicines through the tube. That way, they will not mix with formula. Contact a doctor if:  The tube gets blocked or clogged.  You find any of these on the skin around the tube site: ? Red skin. ? A rash. ? Swelling. ? Leaking fluid. ? Extra skin. Summary  A feeding tube is a tube used to give medicine, water, and liquid food. A person may have this tube if she or he has trouble swallowing or cannot take food or medicine.  Follow the doctor's instructions to care for the tube site and flush the tube every day.  Contact a doctor if the tube gets blocked or clogged. This information is not intended to replace advice given to you by your health care provider. Make sure you discuss any questions you have with your health care provider. Document Released: 08/18/2012 Document Revised: 01/02/2017 Document Reviewed: 01/02/2017 Elsevier Interactive Patient Education  2019 [employer]  PEG  Tube Home Guide A PEG tube is used to put food and fluids into the stomach. Before you leave the hospital, make sure that you know:  How to care for your PEG tube.  How to care for the opening (stoma) in your belly.  How to give yourself a feeding.  How to give yourself medicines.  When to call your doctor for help. Supplies needed:  [employer], plain water  Clean washcloth  Gauze pad  Syringe How to care for a PEG tube Check your PEG tube every day. Make sure:  It is not too tight.  It is in the correct place. There is a mark on the tube that shows when the tube is in the correct place. Adjust the tube if you need to. Cleaning your stoma Clean your stoma every day. Follow these steps: 1. Wash your hands with soap and water. If soap and water are not available, use hand sanitizer. 2. Check your stoma. Let your doctor know if there is: ? Redness. ? Leaking. ? Skin irritation. 3. Wash the stoma gently with warm, soapy water. 4. Rinse the stoma with plain water. 5. Pat the stoma area dry. 6. You may place a gauze pad around the opening of your stoma.  Giving a feeding Your doctor will tell you:  How much nutrition and fluid you will need for each feeding.  How often to have a feeding.  Whether you  should take medicine in the tube by itself or with a feeding. To give yourself a feeding, follow these steps: 1. Lay out all of the things that you will need. 2. Make sure that the nutritional formula is at room temperature. 3. Wash your hands with soap and water. 4. Sit up or stand up straight. You will need to stay sitting up or standing up while you give yourself a feeding. 5. Make sure the syringe plunger is pushed in. Place the tip of the syringe in clean water, and slowly pull the plunger to bring (draw up) the water into the syringe. 6. Remove the clamp and the cap from the PEG tube. 7. Push the water out of the syringe to clean (flush) the tube. 8. If  the tube is clear, draw up the formula into the syringe. Make sure to use the right amount for each feeding. Add water if you need to. 9. Slowly push the formula from the syringe through the tube. 10. After the feeding, flush the tube with water. 11. Put the clamp and the cap on the tube. 12. Stay sitting up or standing up straight for at least 30 minutes. Giving medicine To give yourself medicine, follow these steps: 1. Lay out all of the things that you will need. 2. If your medicine is in tablet form, crush it and dissolve it in water. 3. Wash your hands with soap and water. 4. Sit up or stand up straight. You will need to stay sitting up or standing up while you give yourself medicine. 5. Make sure the syringe plunger is pushed in. Place the tip of the syringe in clean water, and slowly pull the plunger to bring (draw up) the water into the syringe. 6. Remove the clamp and the cap from the PEG tube. 7. Push the water out of the syringe to clean (flush) the tube. 8. If the tube is clear, draw up the medicine into the syringe. 9. Slowly push the medicine from the syringe through the tube. 10. Flush the tube with water. 11. Put the clamp and the cap on the tube. 12. Stay sitting up or standing up straight for at least 30 minutes. Do not take sustained release (SR) medicines through your tube. If you are not sure if your medicine is a SR medicine, ask your doctor. Contact a doctor if:  The area around your stoma is sore, irritated, or red.  You have belly pain or bloating while you are feeding, or after you feed.  You feel sick to your stomach (nausea) for a long time.  You cannot poop (constipated) or you have watery poop (diarrhea) for a long time.  You have a fever.  You have problems with your PEG tube. Get help right away if:  Your tube is blocked.  Your tube falls out.  You have pain around your tube.  You are bleeding from your tube.  Your tube is leaking.  You  choke or you have trouble breathing while you are feeding or after you feed. Summary  A PEG tube is used to put food and fluids into the stomach.  Before you leave the hospital, you will be taught how to use and care for your PEG tube.  Your doctor will give you instructions on how to give yourself feedings and medicines.  Contact your doctor if you have fever or soreness, redness, or irritation around your stoma.  Get help right away if your tube leaks, is blocked, or falls  out. Also, get help right away if you have pain or bleeding around your stoma. This information is not intended to replace advice given to you by your health care provider. Make sure you discuss any questions you have with your health care provider. Document Released: 12/27/2010 Document Revised: 12/07/2017 Document Reviewed: 12/07/2017 Elsevier Interactive Patient Education  2019 [place] Anesthesia is a term that refers to techniques, procedures, and medicines that help a person stay safe and comfortable during a medical procedure. Monitored anesthesia care, or sedation, is one type of anesthesia. Your anesthesia specialist may recommend sedation if you will be having a procedure that does not require you to be unconscious, such as:  Cataract surgery.  A dental procedure.  A biopsy.  A colonoscopy. During the procedure, you may receive a medicine to help you relax (sedative). There are three levels of sedation:  Mild sedation. At this level, you may feel awake and relaxed. You will be able to follow directions.  Moderate sedation. At this level, you will be sleepy. You may not remember the procedure.  Deep sedation. At this level, you will be asleep. You will not remember the procedure. The more medicine you are given, the deeper your level of sedation will be. Depending on how you respond to the procedure, the anesthesia specialist may change your level of sedation or the type  of anesthesia to fit your needs. An anesthesia specialist will monitor you closely during the procedure. Let your health care provider know about:  Any allergies you have.  All medicines you are taking, including vitamins, herbs, eye drops, creams, and over-the-counter medicines.  Any use of steroids (by mouth or as a cream).  Any problems you or family members have had with sedatives and anesthetic medicines.  Any blood disorders you have.  Any surgeries you have had.  Any medical conditions you have, such as sleep apnea.  Whether you are pregnant or may be pregnant.  Any use of cigarettes, alcohol, or street drugs. What are the risks? Generally, this is a safe procedure. However, problems may occur, including:  Getting too much medicine (oversedation).  Nausea.  Allergic reaction to medicines.  Trouble breathing. If this happens, a breathing tube may be used to help with breathing. It will be removed when you are awake and breathing on your own.  Heart trouble.  Lung trouble. Before the procedure Staying hydrated Follow instructions from your health care provider about hydration, which may include:  Up to 2 hours before the procedure - you may continue to drink clear liquids, such as water, clear fruit juice, black coffee, and plain tea. Eating and drinking restrictions Follow instructions from your health care provider about eating and drinking, which may include:  8 hours before the procedure - stop eating heavy meals or foods such as meat, fried foods, or fatty foods.  6 hours before the procedure - stop eating light meals or foods, such as toast or cereal.  6 hours before the procedure - stop drinking milk or drinks that contain milk.  2 hours before the procedure - stop drinking clear liquids. Medicines Ask your health care provider about:  Changing or stopping your regular medicines. This is especially important if you are taking diabetes medicines or blood  thinners.  Taking medicines such as aspirin and ibuprofen. These medicines can thin your blood. Do not take these medicines before your procedure if your health care provider instructs you not to. Tests and exams  You will have a physical exam.  You may have blood tests done to show: ? How well your kidneys and liver are working. ? How well your blood can clot. General instructions  Plan to have someone take you home from the hospital or clinic.  If you will be going home right after the procedure, plan to have someone with you for 24 hours.  What happens during the procedure?  Your blood pressure, heart rate, breathing, level of pain and overall condition will be monitored.  An IV tube will be inserted into one of your veins.  Your anesthesia specialist will give you medicines as needed to keep you comfortable during the procedure. This may mean changing the level of sedation.  The procedure will be performed. After the procedure  Your blood pressure, heart rate, breathing rate, and blood oxygen level will be monitored until the medicines you were given have worn off.  Do not drive for 24 hours if you received a sedative.  You may: ? Feel sleepy, clumsy, or nauseous. ? Feel forgetful about what happened after the procedure. ? Have a sore throat if you had a breathing tube during the procedure. ? Vomit. This information is not intended to replace advice given to you by your health care provider. Make sure you discuss any questions you have with your health care provider. Document Released: 08/20/2005 Document Revised: 05/02/2016 Document Reviewed: 03/16/2016 Elsevier Interactive Patient Education  2019 [place], Care After These instructions provide you with information about caring for yourself after your procedure. Your health care provider may also give you more specific instructions. Your treatment has been planned according to current  medical practices, but problems sometimes occur. Call your health care provider if you have any problems or questions after your procedure. What can I expect after the procedure? After your procedure, you may:  Feel sleepy for several hours.  Feel clumsy and have poor balance for several hours.  Feel forgetful about what happened after the procedure.  Have poor judgment for several hours.  Feel nauseous or vomit.  Have a sore throat if you had a breathing tube during the procedure. Follow these instructions at home: For at least 24 hours after the procedure:      Have a responsible adult stay with you. It is important to have someone help care for you until you are awake and alert.  Rest as needed.  Do not: ? Participate in activities in which you could fall or become injured. ? Drive. ? Use heavy machinery. ? Drink alcohol. ? Take sleeping pills or medicines that cause drowsiness. ? Make important decisions or sign legal documents. ? Take care of children on your own. Eating and drinking  Follow the diet that is recommended by your health care provider.  If you vomit, drink water, juice, or soup when you can drink without vomiting.  Make sure you have little or no nausea before eating solid foods. General instructions  Take over-the-counter and prescription medicines only as told by your health care provider.  If you have sleep apnea, surgery and certain medicines can increase your risk for breathing problems. Follow instructions from your health care provider about wearing your sleep device: ? Anytime you are sleeping, including during daytime naps. ? While taking prescription pain medicines, sleeping medicines, or medicines that make you drowsy.  If you smoke, do not smoke without supervision.  Keep all follow-up visits as told by your health care provider. This is  important. Contact a health care provider if:  You keep feeling nauseous or you keep  vomiting.  You feel light-headed.  You develop a rash.  You have a fever. Get help right away if:  You have trouble breathing. Summary  For several hours after your procedure, you may feel sleepy and have poor judgment.  Have a responsible adult stay with you for at least 24 hours or until you are awake and alert. This information is not intended to replace advice given to you by your health care provider. Make sure you discuss any questions you have with your health care provider. Document Released: 03/16/2016 Document Revised: 07/10/2017 Document Reviewed: 03/16/2016 Elsevier Interactive Patient Education  2019 [employer].

## 2019-01-11 ENCOUNTER — Encounter: Payer: Self-pay | Admitting: General Surgery

## 2019-01-11 ENCOUNTER — Ambulatory Visit (INDEPENDENT_AMBULATORY_CARE_PROVIDER_SITE_OTHER): Payer: Medicare Other | Admitting: General Surgery

## 2019-01-11 VITALS — BP 143/104 | HR 103 | Temp 98.6°F | Resp 20 | Wt 184.0 lb

## 2019-01-11 DIAGNOSIS — C109 Malignant neoplasm of oropharynx, unspecified: Secondary | ICD-10-CM | POA: Diagnosis not present

## 2019-01-11 DIAGNOSIS — Z79891 Long term (current) use of opiate analgesic: Secondary | ICD-10-CM | POA: Diagnosis not present

## 2019-01-11 DIAGNOSIS — E11610 Type 2 diabetes mellitus with diabetic neuropathic arthropathy: Secondary | ICD-10-CM | POA: Diagnosis not present

## 2019-01-11 DIAGNOSIS — G894 Chronic pain syndrome: Secondary | ICD-10-CM | POA: Diagnosis not present

## 2019-01-11 DIAGNOSIS — Z5181 Encounter for therapeutic drug level monitoring: Secondary | ICD-10-CM | POA: Diagnosis not present

## 2019-01-11 NOTE — Progress Notes (Signed)
[facility] History and Physical  Reason for Referral: Oropharyngeal cancer  Referring Physician: Dr. Delton Coombes   Chief Complaint    Cancer      Lance [name] is a 68 y.o. male.  HPI: Lance [name] isa 68 yo with a history of smoking who developed tongue pain about 1 year ago and was seen by ENT and found to have no findings on exam or CT. He continued to have this pain and underwent a repeat CT and was noted to have a mass in the base of his mouth on the left and an enlarged lymph node. He has undergone biopsies and this is a squamous cell cancer. He is going to start chemotherapy and radiation for this cancer soon, and we have been asked to place a port a catheter and a PEG.   He currently is able to eat and drink. He wears dentures and is not able to wear them now due to the pain. He says he chews on the right side of his mouth.  Given his treatment and the radiation it is likely he may be unable to eat for part of the time.  He says he has lost about 10-20 lbs in the last year. Aside from the mouth pain he denies any other major issues.  He denies any history of any heart attacks or strokes.    Past Medical History:  Diagnosis Date  . Arthritis   . Back pain   . COPD (chronic obstructive pulmonary disease) (HCC)    mild per pt  . Depression   . Diabetes mellitus without complication ([place])   . GERD (gastroesophageal reflux disease)   . Gout   . Hypertension     Past Surgical History:  Procedure Laterality Date  . EYE SURGERY    . FLOOR OF MOUTH BIOPSY Left 01/04/2019   Procedure: FLOOR OF MOUTH BIOPSY;  Surgeon: Leta Baptist, MD;  Location: [facility];  Service: ENT;  Laterality: Left;  . LEG SURGERY      Family History  Problem Relation Age of Onset  . Heart attack Mother   . COPD Mother   . Lung cancer Father     Social History   Tobacco Use  . Smoking status: Current Every Day Smoker    Packs/day: 1.00    Types: Cigarettes  . Smokeless  tobacco: Never Used  Substance Use Topics  . Alcohol use: Yes    Comment: occasional   . Drug use: No    Medications: I have reviewed the patient's current medications. Allergies as of 01/11/2019   No Known Allergies     Medication List       Accurate as of January 11, 2019  1:53 PM. Always use your most recent med list.        albuterol 108 (90 Base) MCG/ACT inhaler Commonly known as:  PROVENTIL HFA;VENTOLIN HFA Inhale 1 puff into the lungs every 6 (six) hours as needed for wheezing or shortness of breath.   baclofen 10 MG tablet Commonly known as:  LIORESAL Take 10 mg by mouth at bedtime.   colchicine 0.6 MG tablet Take 0.6 mg by mouth 2 (two) times daily.   DULoxetine 30 MG capsule Commonly known as:  CYMBALTA Take 30 mg by mouth at bedtime.   fenofibrate 160 MG tablet Take 160 mg by mouth daily.   gabapentin 800 MG tablet Commonly known as:  NEURONTIN Take 800 mg by mouth 3 (three) times daily.  INVOKANA 100 MG Tabs tablet Generic drug:  canagliflozin Take 100 mg by mouth daily before breakfast.   lisinopril 10 MG tablet Commonly known as:  PRINIVIL,ZESTRIL Take 10 mg by mouth daily.   metFORMIN 1000 MG tablet Commonly known as:  GLUCOPHAGE Take 1,000 mg by mouth 2 (two) times daily with a meal.   omeprazole 20 MG capsule Commonly known as:  PRILOSEC Take 20 mg by mouth daily.   Oxycodone HCl 10 MG Tabs Take 10 mg by mouth 3 (three) times daily.   pravastatin 10 MG tablet Commonly known as:  PRAVACHOL Take 10 mg by mouth daily with supper.        ROS:  A comprehensive review of systems was negative except for: Ears, nose, mouth, throat, and face: positive for pain in the left side of mouth, swollen lymph node on left Respiratory: positive for wheezing and SOB Musculoskeletal: positive for back pain, neck pain and stiff joints Endocrine: positive for tired, sluggish  Blood pressure (!) 143/104, pulse (!) 103, temperature 98.6 F (37 C),  temperature source Temporal, resp. rate 20, weight 184 lb (83.5 kg). Physical Exam Vitals signs reviewed.  Constitutional:      Appearance: He is normal weight.  HENT:     Head: Normocephalic and atraumatic.     Nose: Nose normal.     Mouth/Throat:     Mouth: Mucous membranes are moist.     Comments: Swelling / discoloration under the tongue Eyes:     Pupils: Pupils are equal, round, and reactive to light.  Neck:     Musculoskeletal: Normal range of motion.  Cardiovascular:     Rate and Rhythm: Normal rate and regular rhythm.  Pulmonary:     Effort: Pulmonary effort is normal.  Abdominal:     General: There is no distension.     Palpations: Abdomen is soft.     Tenderness: There is no abdominal tenderness.     Comments: Diastasis of the midline 96PR, small umbilical hernia  Musculoskeletal: Normal range of motion.        General: No swelling.  Lymphadenopathy:     Cervical: Cervical adenopathy present.     Left cervical: Superficial cervical adenopathy present.  Skin:    General: Skin is warm and dry.  Neurological:     General: No focal deficit present.     Mental Status: He is alert and oriented to person, place, and time.  Psychiatric:        Mood and Affect: Mood normal.        Behavior: Behavior normal.        Thought Content: Thought content normal.        Judgment: Judgment normal.     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 EXAM: NUCLEAR MEDICINE PET SKULL BASE TO THIGH TECHNIQUE: 8.67 mCi F-18 FDG was injected  intravenously. Full-ring PET imaging was performed from the skull base to thigh after the radiotracer. CT data was obtained and used for attenuation correction and anatomic localization. Fasting blood  glucose: 113 mg/dl COMPARISON:  Neck CT 12/24/2018 FINDINGS: Mediastinal blood pool activity: SUV max 1.98 NECK: The enhancing mass in the floor of the mouth on the left side is hypermetabolic with SUV max of 9.92. The 13 mm level 1 lymph node on the left side is hypermetabolic with SUV max of 4.26 and consistent with metastatic adenopathy. The left submandibular gland is higher in attenuation than the right submandibular gland and also slightly larger. It is weakly hypermetabolic with SUV max of 8.34 compared to the right submandibular gland which is 1.68. This is equivocal for tumor involvement. No supraclavicular adenopathy. Incidental CT findings: none CHEST: 27 x 21 mm thick walled cavitary lesion in the left upper lobe is noted along with a slightly more superior adjacent 8 mm nodule. This demonstrates no significant hypermetabolism. SUV max is 1.51 which is less than the mediastinal background activity. This could be a postinflammatory or postinfectious process. However, I would recommend close CT follow-up. No enlarged or hypermetabolic mediastinal or hilar lymph nodes. Small scattered lymph nodes are noted but no hypermetabolism. The esophagus is grossly normal. Incidental CT findings: Age advanced three-vessel coronary artery calcifications are noted along with moderate aortic calcifications. ABDOMEN/PELVIS: No abnormal hypermetabolic activity within the liver, pancreas, adrenal glands, or spleen. No hypermetabolic lymph nodes in the abdomen or pelvis. Incidental CT findings: Age advanced atherosclerotic calcifications involving the aorta, iliac arteries and branch vessels. No focal aneurysm. Bilateral low-attenuation adrenal gland lesions consistent with benign adenomas. No hypermetabolism. SKELETON: No  focal hypermetabolic activity to suggest skeletal metastasis. Incidental CT findings: none IMPRESSION: 1. The floor of the mouth lesion on the left is hypermetabolic and consistent with patient's known squamous cell carcinoma. 2. Left level 1 lymph node is hypermetabolic and consistent with metastatic adenopathy. 3. Equivocal left submandibular gland as detailed above. 4. 2.7 x 2.1 mm thick walled cavitary lesion in the left upper lobe not demonstrating any significant hypermetabolism. Recommend CT surveillance with follow-up noncontrast chest CT in 4-6 months. 5. No enlarged or hypermetabolic mediastinal/hilar adenopathy. 6. No findings for metastatic disease involving the abdomen/pelvis. Benign-appearing bilateral adrenal gland adenomas. 7. Age advanced atherosclerotic calcifications involving the thoracic and abdominal aorta and branch vessels including three-vessel coronary artery calcifications. Electronically Signed   By: Marijo Sanes M.D.   On: 01/10/2019 14:08     Assessment & Plan:  Lance [name] is a 68 y.o. male with oropharyngeal cancer who is going to start chemotherapy and radiation. Given the treatment and the location of the cancer there is concern for adequate intake and need for PEG in addition to the port a cathter. We discussed port a catheter placement, and attempts to place on the right given the left side of the cancer.  We discussed the risk of the port placement being bleeding, infection, malfunction, pneumothorax, and need for going to the other side if needed.   We discussed the PEG and the reason for the PEG. We discussed that it has to stay in for 8 weeks, and that he will need to secure it and wear a binder for protection. We discussed that the cancer center is going to get him home health arranged and nutrition arranged. We discussed that this is being put in just in case he were to need it but that he can continue to eat and drink per his normal until he is unable to do so. We  discussed the risk of bleeding, infection, injury to other organs, need for an open procedure or  an emergency surgery if it were to get pulled out.  He is in agreement and wants to proceed.   Plan to stay overnight for monitoring.   All questions were answered to the satisfaction of the patient and family.   Virl Cagey 01/11/2019, 1:53 PM

## 2019-01-11 NOTE — H&P (Signed)
[facility] History and Physical  Reason for Referral: Oropharyngeal cancer  Referring Physician: Dr. Delton Coombes      Chief Complaint    Cancer      Lance [name] is a 68 y.o. male.  HPI: Lance [name] isa 68 yo with a history of smoking who developed tongue pain about 1 year ago and was seen by ENT and found to have no findings on exam or CT. He continued to have this pain and underwent a repeat CT and was noted to have a mass in the base of his mouth on the left and an enlarged lymph node. He has undergone biopsies and this is a squamous cell cancer. He is going to start chemotherapy and radiation for this cancer soon, and we have been asked to place a port a catheter and a PEG.   He currently is able to eat and drink. He wears dentures and is not able to wear them now due to the pain. He says he chews on the right side of his mouth.  Given his treatment and the radiation it is likely he may be unable to eat for part of the time.  He says he has lost about 10-20 lbs in the last year. Aside from the mouth pain he denies any other major issues.  He denies any history of any heart attacks or strokes.        Past Medical History:  Diagnosis Date  . Arthritis   . Back pain   . COPD (chronic obstructive pulmonary disease) (HCC)    mild per pt  . Depression   . Diabetes mellitus without complication ([place])   . GERD (gastroesophageal reflux disease)   . Gout   . Hypertension          Past Surgical History:  Procedure Laterality Date  . EYE SURGERY    . FLOOR OF MOUTH BIOPSY Left 01/04/2019   Procedure: FLOOR OF MOUTH BIOPSY;  Surgeon: Leta Baptist, MD;  Location: [facility];  Service: ENT;  Laterality: Left;  . LEG SURGERY           Family History  Problem Relation Age of Onset  . Heart attack Mother   . COPD Mother   . Lung cancer Father     Social History        Tobacco Use  . Smoking status: Current Every Day  Smoker    Packs/day: 1.00    Types: Cigarettes  . Smokeless tobacco: Never Used  Substance Use Topics  . Alcohol use: Yes    Comment: occasional   . Drug use: No    Medications: I have reviewed the patient's current medications. Allergies as of 01/11/2019   No Known Allergies        Medication List       Accurate as of January 11, 2019  1:53 PM. Always use your most recent med list.        albuterol 108 (90 Base) MCG/ACT inhaler Commonly known as:  PROVENTIL HFA;VENTOLIN HFA Inhale 1 puff into the lungs every 6 (six) hours as needed for wheezing or shortness of breath.   baclofen 10 MG tablet Commonly known as:  LIORESAL Take 10 mg by mouth at bedtime.   colchicine 0.6 MG tablet Take 0.6 mg by mouth 2 (two) times daily.   DULoxetine 30 MG capsule Commonly known as:  CYMBALTA Take 30 mg by mouth at bedtime.   fenofibrate 160 MG tablet Take 160 mg  by mouth daily.   gabapentin 800 MG tablet Commonly known as:  NEURONTIN Take 800 mg by mouth 3 (three) times daily.   INVOKANA 100 MG Tabs tablet Generic drug:  canagliflozin Take 100 mg by mouth daily before breakfast.   lisinopril 10 MG tablet Commonly known as:  PRINIVIL,ZESTRIL Take 10 mg by mouth daily.   metFORMIN 1000 MG tablet Commonly known as:  GLUCOPHAGE Take 1,000 mg by mouth 2 (two) times daily with a meal.   omeprazole 20 MG capsule Commonly known as:  PRILOSEC Take 20 mg by mouth daily.   Oxycodone HCl 10 MG Tabs Take 10 mg by mouth 3 (three) times daily.   pravastatin 10 MG tablet Commonly known as:  PRAVACHOL Take 10 mg by mouth daily with supper.        ROS:  A comprehensive review of systems was negative except for: Ears, nose, mouth, throat, and face: positive for pain in the left side of mouth, swollen lymph node on left Respiratory: positive for wheezing and SOB Musculoskeletal: positive for back pain, neck pain and stiff joints Endocrine: positive  for tired, sluggish  Blood pressure (!) 143/104, pulse (!) 103, temperature 98.6 F (37 C), temperature source Temporal, resp. rate 20, weight 184 lb (83.5 kg). Physical Exam Vitals signs reviewed.  Constitutional:      Appearance: He is normal weight.  HENT:     Head: Normocephalic and atraumatic.     Nose: Nose normal.     Mouth/Throat:     Mouth: Mucous membranes are moist.     Comments: Swelling / discoloration under the tongue Eyes:     Pupils: Pupils are equal, round, and reactive to light.  Neck:     Musculoskeletal: Normal range of motion.  Cardiovascular:     Rate and Rhythm: Normal rate and regular rhythm.  Pulmonary:     Effort: Pulmonary effort is normal.  Abdominal:     General: There is no distension.     Palpations: Abdomen is soft.     Tenderness: There is no abdominal tenderness.     Comments: Diastasis of the midline 47SJ, small umbilical hernia  Musculoskeletal: Normal range of motion.        General: No swelling.  Lymphadenopathy:     Cervical: Cervical adenopathy present.     Left cervical: Superficial cervical adenopathy present.  Skin:    General: Skin is warm and dry.  Neurological:     General: No focal deficit present.     Mental Status: He is alert and oriented to person, place, and time.  Psychiatric:        Mood and Affect: Mood normal.        Behavior: Behavior normal.        Thought Content: Thought content normal.        Judgment: Judgment normal.     Results: LabResultsLast48Hours       Results for orders placed or performed during the hospital encounter of 01/10/19 (from the past 48 hour(s))  Glucose, capillary     Status: Abnormal   Collection Time: 01/10/19 11:15 AM  Result Value Ref Range   Glucose-Capillary 113 (H) 70 - 99 mg/dL     Reviewed imaging- floor of mouth hypermetabolic area   [id number])  Nm Pet Image Initial (pi) Skull Base To Thigh  Result Date: 01/10/2019 CLINICAL DATA:   Initial treatment strategy for squamous cell carcinoma of the floor of the mouth. EXAM: NUCLEAR MEDICINE PET SKULL BASE TO  THIGH TECHNIQUE: 8.67 mCi F-18 FDG was injected intravenously. Full-ring PET imaging was performed from the skull base to thigh after the radiotracer. CT data was obtained and used for attenuation correction and anatomic localization. Fasting blood glucose: 113 mg/dl COMPARISON:  Neck CT 12/24/2018 FINDINGS: Mediastinal blood pool activity: SUV max 1.98 NECK: The enhancing mass in the floor of the mouth on the left side is hypermetabolic with SUV max of 3.24. The 13 mm level 1 lymph node on the left side is hypermetabolic with SUV max of 4.01 and consistent with metastatic adenopathy. The left submandibular gland is higher in attenuation than the right submandibular gland and also slightly larger. It is weakly hypermetabolic with SUV max of 0.27 compared to the right submandibular gland which is 1.68. This is equivocal for tumor involvement. No supraclavicular adenopathy. Incidental CT findings: none CHEST: 27 x 21 mm thick walled cavitary lesion in the left upper lobe is noted along with a slightly more superior adjacent 8 mm nodule. This demonstrates no significant hypermetabolism. SUV max is 1.51 which is less than the mediastinal background activity. This could be a postinflammatory or postinfectious process. However, I would recommend close CT follow-up. No enlarged or hypermetabolic mediastinal or hilar lymph nodes. Small scattered lymph nodes are noted but no hypermetabolism. The esophagus is grossly normal. Incidental CT findings: Age advanced three-vessel coronary artery calcifications are noted along with moderate aortic calcifications. ABDOMEN/PELVIS: No abnormal hypermetabolic activity within the liver, pancreas, adrenal glands, or spleen. No hypermetabolic lymph nodes in the abdomen or pelvis. Incidental CT findings: Age advanced atherosclerotic calcifications involving the aorta,  iliac arteries and branch vessels. No focal aneurysm. Bilateral low-attenuation adrenal gland lesions consistent with benign adenomas. No hypermetabolism. SKELETON: No focal hypermetabolic activity to suggest skeletal metastasis. Incidental CT findings: none IMPRESSION: 1. The floor of the mouth lesion on the left is hypermetabolic and consistent with patient's known squamous cell carcinoma. 2. Left level 1 lymph node is hypermetabolic and consistent with metastatic adenopathy. 3. Equivocal left submandibular gland as detailed above. 4. 2.7 x 2.1 mm thick walled cavitary lesion in the left upper lobe not demonstrating any significant hypermetabolism. Recommend CT surveillance with follow-up noncontrast chest CT in 4-6 months. 5. No enlarged or hypermetabolic mediastinal/hilar adenopathy. 6. No findings for metastatic disease involving the abdomen/pelvis. Benign-appearing bilateral adrenal gland adenomas. 7. Age advanced atherosclerotic calcifications involving the thoracic and abdominal aorta and branch vessels including three-vessel coronary artery calcifications. Electronically Signed   By: Marijo Sanes M.D.   On: 01/10/2019 14:08      Assessment & Plan:  DERK DOUBEK is a 68 y.o. male with oropharyngeal cancer who is going to start chemotherapy and radiation. Given the treatment and the location of the cancer there is concern for adequate intake and need for PEG in addition to the port a cathter. We discussed port a catheter placement, and attempts to place on the right given the left side of the cancer.  We discussed the risk of the port placement being bleeding, infection, malfunction, pneumothorax, and need for going to the other side if needed.   We discussed the PEG and the reason for the PEG. We discussed that it has to stay in for 8 weeks, and that he will need to secure it and wear a binder for protection. We discussed that the cancer center is going to get him home health arranged and  nutrition arranged. We discussed that this is being put in just in case he were to  need it but that he can continue to eat and drink per his normal until he is unable to do so. We discussed the risk of bleeding, infection, injury to other organs, need for an open procedure or an emergency surgery if it were to get pulled out.  He is in agreement and wants to proceed.   Plan to stay overnight for monitoring.   All questions were answered to the satisfaction of the patient and family.   Virl Cagey 01/11/2019, 1:53 PM

## 2019-01-11 NOTE — Patient Instructions (Signed)
Implanted Port Home Guide An implanted port is a device that is placed under the skin. It is usually placed in the chest. The device can be used to give IV medicine, to take blood, or for dialysis. You may have an implanted port if:  You need IV medicine that would be irritating to the small veins in your hands or arms.  You need IV medicines, such as antibiotics, for a long period of time.  You need IV nutrition for a long period of time.  You need dialysis. Having a port means that your health care provider will not need to use the veins in your arms for these procedures. You may have fewer limitations when using a port than you would if you used other types of long-term IVs, and you will likely be able to return to normal activities after your incision heals. An implanted port has two main parts:  Reservoir. The reservoir is the part where a needle is inserted to give medicines or draw blood. The reservoir is round. After it is placed, it appears as a small, raised area under your skin.  Catheter. The catheter is a thin, flexible tube that connects the reservoir to a vein. Medicine that is inserted into the reservoir goes into the catheter and then into the vein. How is my port accessed? To access your port:  A numbing cream may be placed on the skin over the port site.  Your health care provider will put on a mask and sterile gloves.  The skin over your port will be cleaned carefully with a germ-killing soap and allowed to dry.  Your health care provider will gently pinch the port and insert a needle into it.  Your health care provider will check for a blood return to make sure the port is in the vein and is not clogged.  If your port needs to remain accessed to get medicine continuously (constant infusion), your health care provider will place a clear bandage (dressing) over the needle site. The dressing and needle will need to be changed every week, or as told by your health care  provider. What is flushing? Flushing helps keep the port from getting clogged. Follow instructions from your health care provider about how and when to flush the port. Ports are usually flushed with saline solution or a medicine called heparin. The need for flushing will depend on how the port is used:  If the port is only used from time to time to give medicines or draw blood, the port may need to be flushed: ? Before and after medicines have been given. ? Before and after blood has been drawn. ? As part of routine maintenance. Flushing may be recommended every 4-6 weeks.  If a constant infusion is running, the port may not need to be flushed.  Throw away any syringes in a disposal container that is meant for sharp items (sharps container). You can buy a sharps container from a pharmacy, or you can make one by using an empty hard plastic bottle with a cover. How long will my port stay implanted? The port can stay in for as long as your health care provider thinks it is needed. When it is time for the port to come out, a surgery will be done to remove it. The surgery will be similar to the procedure that was done to put the port in. Follow these instructions at home:   Flush your port as told by your health care provider.    If you need an infusion over several days, follow instructions from your health care provider about how to take care of your port site. Make sure you: ? Wash your hands with soap and water before you change your dressing. If soap and water are not available, use alcohol-based hand sanitizer. ? Change your dressing as told by your health care provider. ? Place any used dressings or infusion bags into a plastic bag. Throw that bag in the trash. ? Keep the dressing that covers the needle clean and dry. Do not get it wet. ? Do not use scissors or sharp objects near the tube. ? Keep the tube clamped, unless it is being used.  Check your port site every day for signs of  infection. Check for: ? Redness, swelling, or pain. ? Fluid or blood. ? Pus or a bad smell.  Protect the skin around the port site. ? Avoid wearing bra straps that rub or irritate the site. ? Protect the skin around your port from seat belts. Place a soft pad over your chest if needed.  Bathe or shower as told by your health care provider. The site may get wet as long as you are not actively receiving an infusion.  Return to your normal activities as told by your health care provider. Ask your health care provider what activities are safe for you.  Carry a medical alert card or wear a medical alert bracelet at all times. This will let health care providers know that you have an implanted port in case of an emergency. Get help right away if:  You have redness, swelling, or pain at the port site.  You have fluid or blood coming from your port site.  You have pus or a bad smell coming from the port site.  You have a fever. Summary  Implanted ports are usually placed in the chest for long-term IV access.  Follow instructions from your health care provider about flushing the port and changing bandages (dressings).  Take care of the area around your port by avoiding clothing that puts pressure on the area, and by watching for signs of infection.  Protect the skin around your port from seat belts. Place a soft pad over your chest if needed.  Get help right away if you have a fever or you have redness, swelling, pain, drainage, or a bad smell at the port site. This information is not intended to replace advice given to you by your health care provider. Make sure you discuss any questions you have with your health care provider. Document Released: 11/24/2005 Document Revised: 12/27/2016 Document Reviewed: 12/27/2016 Elsevier Interactive Patient Education  2019 [employer]    PEG Tube Home Guide A PEG tube is used to put food and fluids into the stomach. Before you leave the  hospital, make sure that you know:  How to care for your PEG tube.  How to care for the opening (stoma) in your belly.  How to give yourself a feeding.  How to give yourself medicines.  When to call your doctor for help. Supplies needed:  [employer], plain water  Clean washcloth  Gauze pad  Syringe How to care for a PEG tube Check your PEG tube every day. Make sure:  It is not too tight.  It is in the correct place. There is a mark on the tube that shows when the tube is in the correct place. Adjust the tube if you need to. Cleaning your stoma  Clean your stoma every day. Follow these steps: 1. Wash your hands with soap and water. If soap and water are not available, use hand sanitizer. 2. Check your stoma. Let your doctor know if there is: ? Redness. ? Leaking. ? Skin irritation. 3. Wash the stoma gently with warm, soapy water. 4. Rinse the stoma with plain water. 5. Pat the stoma area dry. 6. You may place a gauze pad around the opening of your stoma.  Giving a feeding Your doctor will tell you:  How much nutrition and fluid you will need for each feeding.  How often to have a feeding.  Whether you should take medicine in the tube by itself or with a feeding. To give yourself a feeding, follow these steps: 1. Lay out all of the things that you will need. 2. Make sure that the nutritional formula is at room temperature. 3. Wash your hands with soap and water. 4. Sit up or stand up straight. You will need to stay sitting up or standing up while you give yourself a feeding. 5. Make sure the syringe plunger is pushed in. Place the tip of the syringe in clean water, and slowly pull the plunger to bring (draw up) the water into the syringe. 6. Remove the clamp and the cap from the PEG tube. 7. Push the water out of the syringe to clean (flush) the tube. 8. If the tube is clear, draw up the formula into the syringe. Make sure to use the right amount for each  feeding. Add water if you need to. 9. Slowly push the formula from the syringe through the tube. 10. After the feeding, flush the tube with water. 11. Put the clamp and the cap on the tube. 12. Stay sitting up or standing up straight for at least 30 minutes. Giving medicine To give yourself medicine, follow these steps: 1. Lay out all of the things that you will need. 2. If your medicine is in tablet form, crush it and dissolve it in water. 3. Wash your hands with soap and water. 4. Sit up or stand up straight. You will need to stay sitting up or standing up while you give yourself medicine. 5. Make sure the syringe plunger is pushed in. Place the tip of the syringe in clean water, and slowly pull the plunger to bring (draw up) the water into the syringe. 6. Remove the clamp and the cap from the PEG tube. 7. Push the water out of the syringe to clean (flush) the tube. 8. If the tube is clear, draw up the medicine into the syringe. 9. Slowly push the medicine from the syringe through the tube. 10. Flush the tube with water. 11. Put the clamp and the cap on the tube. 12. Stay sitting up or standing up straight for at least 30 minutes. Do not take sustained release (SR) medicines through your tube. If you are not sure if your medicine is a SR medicine, ask your doctor. Contact a doctor if:  The area around your stoma is sore, irritated, or red.  You have belly pain or bloating while you are feeding, or after you feed.  You feel sick to your stomach (nausea) for a long time.  You cannot poop (constipated) or you have watery poop (diarrhea) for a long time.  You have a fever.  You have problems with your PEG tube. Get help right away if:  Your tube is blocked.  Your tube falls out.  You have pain  around your tube.  You are bleeding from your tube.  Your tube is leaking.  You choke or you have trouble breathing while you are feeding or after you feed. Summary  A PEG tube is  used to put food and fluids into the stomach.  Before you leave the hospital, you will be taught how to use and care for your PEG tube.  Your doctor will give you instructions on how to give yourself feedings and medicines.  Contact your doctor if you have fever or soreness, redness, or irritation around your stoma.  Get help right away if your tube leaks, is blocked, or falls out. Also, get help right away if you have pain or bleeding around your stoma. This information is not intended to replace advice given to you by your health care provider. Make sure you discuss any questions you have with your health care provider. Document Released: 12/27/2010 Document Revised: 12/07/2017 Document Reviewed: 12/07/2017 Elsevier Interactive Patient Education  2019 [employer].

## 2019-01-12 ENCOUNTER — Ambulatory Visit (HOSPITAL_COMMUNITY): Payer: Medicare Other | Admitting: Anesthesiology

## 2019-01-12 ENCOUNTER — Encounter (HOSPITAL_COMMUNITY)
Admission: RE | Disposition: A | Discharge status: Home / Self Care | Payer: Self-pay | Source: Home / Self Care | Attending: General Surgery

## 2019-01-12 ENCOUNTER — Ambulatory Visit (HOSPITAL_COMMUNITY): Payer: Medicare Other

## 2019-01-12 ENCOUNTER — Other Ambulatory Visit (HOSPITAL_COMMUNITY): Payer: Self-pay | Admitting: *Deleted

## 2019-01-12 ENCOUNTER — Other Ambulatory Visit: Payer: Self-pay

## 2019-01-12 ENCOUNTER — Encounter (HOSPITAL_COMMUNITY): Payer: Self-pay | Admitting: *Deleted

## 2019-01-12 ENCOUNTER — Inpatient Hospital Stay (HOSPITAL_COMMUNITY): Payer: Medicare Other

## 2019-01-12 ENCOUNTER — Observation Stay (HOSPITAL_COMMUNITY)
Admission: RE | Admit: 2019-01-12 | Discharge: 2019-01-13 | Disposition: A | Discharge status: Home / Self Care | Payer: Medicare Other | Attending: General Surgery | Admitting: General Surgery

## 2019-01-12 DIAGNOSIS — E119 Type 2 diabetes mellitus without complications: Secondary | ICD-10-CM | POA: Insufficient documentation

## 2019-01-12 DIAGNOSIS — J449 Chronic obstructive pulmonary disease, unspecified: Secondary | ICD-10-CM | POA: Insufficient documentation

## 2019-01-12 DIAGNOSIS — F329 Major depressive disorder, single episode, unspecified: Secondary | ICD-10-CM | POA: Insufficient documentation

## 2019-01-12 DIAGNOSIS — I10 Essential (primary) hypertension: Secondary | ICD-10-CM | POA: Diagnosis not present

## 2019-01-12 DIAGNOSIS — M109 Gout, unspecified: Secondary | ICD-10-CM | POA: Diagnosis not present

## 2019-01-12 DIAGNOSIS — C069 Malignant neoplasm of mouth, unspecified: Secondary | ICD-10-CM | POA: Diagnosis not present

## 2019-01-12 DIAGNOSIS — K219 Gastro-esophageal reflux disease without esophagitis: Secondary | ICD-10-CM | POA: Diagnosis not present

## 2019-01-12 DIAGNOSIS — E440 Moderate protein-calorie malnutrition: Secondary | ICD-10-CM | POA: Diagnosis not present

## 2019-01-12 DIAGNOSIS — Z79899 Other long term (current) drug therapy: Secondary | ICD-10-CM | POA: Insufficient documentation

## 2019-01-12 DIAGNOSIS — F17210 Nicotine dependence, cigarettes, uncomplicated: Secondary | ICD-10-CM | POA: Diagnosis not present

## 2019-01-12 DIAGNOSIS — M1990 Unspecified osteoarthritis, unspecified site: Secondary | ICD-10-CM | POA: Diagnosis not present

## 2019-01-12 DIAGNOSIS — Z6823 Body mass index (BMI) 23.0-23.9, adult: Secondary | ICD-10-CM | POA: Insufficient documentation

## 2019-01-12 DIAGNOSIS — I700 Atherosclerosis of aorta: Secondary | ICD-10-CM | POA: Diagnosis not present

## 2019-01-12 DIAGNOSIS — C109 Malignant neoplasm of oropharynx, unspecified: Principal | ICD-10-CM | POA: Insufficient documentation

## 2019-01-12 DIAGNOSIS — Z7984 Long term (current) use of oral hypoglycemic drugs: Secondary | ICD-10-CM | POA: Insufficient documentation

## 2019-01-12 DIAGNOSIS — Z4682 Encounter for fitting and adjustment of non-vascular catheter: Secondary | ICD-10-CM | POA: Diagnosis not present

## 2019-01-12 DIAGNOSIS — Z95828 Presence of other vascular implants and grafts: Secondary | ICD-10-CM

## 2019-01-12 HISTORY — PX: PEG PLACEMENT: SHX5437

## 2019-01-12 HISTORY — PX: ESOPHAGOGASTRODUODENOSCOPY (EGD) WITH PROPOFOL: SHX5813

## 2019-01-12 HISTORY — PX: PORTACATH PLACEMENT: SHX2246

## 2019-01-12 LAB — GLUCOSE, CAPILLARY
Glucose-Capillary: 160 mg/dL — ABNORMAL HIGH (ref 70–99)
Glucose-Capillary: 112 mg/dL — ABNORMAL HIGH (ref 70–99)

## 2019-01-12 SURGERY — INSERTION, TUNNELED CENTRAL VENOUS DEVICE, WITH PORT
Anesthesia: Monitor Anesthesia Care | Site: Mouth | Laterality: Right

## 2019-01-12 MED ORDER — SODIUM CHLORIDE (PF) 0.9 % IJ SOLN
INTRAMUSCULAR | Status: AC
  Filled 2019-01-12: qty 10

## 2019-01-12 MED ORDER — GLYCOPYRROLATE PF 0.2 MG/ML IJ SOSY
PREFILLED_SYRINGE | INTRAMUSCULAR | Status: AC
  Filled 2019-01-12: qty 1

## 2019-01-12 MED ORDER — LIDOCAINE HCL (PF) 1 % IJ SOLN
INTRAMUSCULAR | Status: AC
  Filled 2019-01-12: qty 30

## 2019-01-12 MED ORDER — BACLOFEN 10 MG PO TABS
10.0000 mg | ORAL_TABLET | Freq: Every day | ORAL | Status: DC
  Administered 2019-01-12: 10 mg via ORAL
  Filled 2019-01-12: qty 1

## 2019-01-12 MED ORDER — ALBUTEROL SULFATE (2.5 MG/3ML) 0.083% IN NEBU: 3.0000 mL | INHALATION_SOLUTION | Freq: Four times a day (QID) | RESPIRATORY_TRACT | Status: DC | PRN

## 2019-01-12 MED ORDER — GABAPENTIN 400 MG PO CAPS
800.0000 mg | ORAL_CAPSULE | Freq: Three times a day (TID) | ORAL | Status: DC
  Administered 2019-01-12 – 2019-01-13 (×2): 800 mg via ORAL
  Filled 2019-01-12 (×2): qty 2

## 2019-01-12 MED ORDER — KETAMINE HCL 50 MG/5ML IJ SOSY
PREFILLED_SYRINGE | INTRAMUSCULAR | Status: AC
  Filled 2019-01-12: qty 5

## 2019-01-12 MED ORDER — MORPHINE SULFATE (PF) 2 MG/ML IV SOLN
2.0000 mg | INTRAVENOUS | Status: DC | PRN
  Administered 2019-01-12 – 2019-01-13 (×2): 2 mg via INTRAVENOUS
  Filled 2019-01-12 (×2): qty 1

## 2019-01-12 MED ORDER — HEPARIN SOD (PORK) LOCK FLUSH 100 UNIT/ML IV SOLN
INTRAVENOUS | Status: AC
  Filled 2019-01-12: qty 5

## 2019-01-12 MED ORDER — HEPARIN SOD (PORK) LOCK FLUSH 100 UNIT/ML IV SOLN
INTRAVENOUS | Status: DC | PRN
  Administered 2019-01-12: 500 [IU]

## 2019-01-12 MED ORDER — MEPERIDINE HCL 50 MG/ML IJ SOLN: 6.2500 mg | INTRAMUSCULAR | Status: DC | PRN

## 2019-01-12 MED ORDER — DEXTROSE-NACL 5-0.45 % IV SOLN
INTRAVENOUS | Status: DC
  Administered 2019-01-12: 16:00:00 via INTRAVENOUS

## 2019-01-12 MED ORDER — FENTANYL CITRATE (PF) 100 MCG/2ML IJ SOLN
INTRAMUSCULAR | Status: DC | PRN
  Administered 2019-01-12: 62.5 ug via INTRAVENOUS
  Administered 2019-01-12: 25 ug via INTRAVENOUS
  Administered 2019-01-12: 12.5 ug via INTRAVENOUS

## 2019-01-12 MED ORDER — SODIUM CHLORIDE FLUSH 0.9 % IV SOLN
INTRAVENOUS | Status: DC | PRN
  Administered 2019-01-12: 30 mL

## 2019-01-12 MED ORDER — COLCHICINE 0.6 MG PO TABS
0.6000 mg | ORAL_TABLET | Freq: Two times a day (BID) | ORAL | Status: DC
  Administered 2019-01-12 – 2019-01-13 (×2): 0.6 mg via ORAL
  Filled 2019-01-12 (×2): qty 1

## 2019-01-12 MED ORDER — CANAGLIFLOZIN 100 MG PO TABS
100.0000 mg | ORAL_TABLET | Freq: Every day | ORAL | Status: DC
  Administered 2019-01-13: 100 mg via ORAL
  Filled 2019-01-12 (×2): qty 1

## 2019-01-12 MED ORDER — OXYCODONE HCL 5 MG PO TABS
5.0000 mg | ORAL_TABLET | ORAL | Status: DC | PRN
  Administered 2019-01-12: 5 mg via ORAL
  Filled 2019-01-12: qty 1

## 2019-01-12 MED ORDER — CHLORHEXIDINE GLUCONATE CLOTH 2 % EX PADS: 6.0000 | MEDICATED_PAD | Freq: Once | CUTANEOUS | Status: DC

## 2019-01-12 MED ORDER — METOPROLOL TARTRATE 5 MG/5ML IV SOLN: 5.0000 mg | Freq: Four times a day (QID) | INTRAVENOUS | Status: DC | PRN

## 2019-01-12 MED ORDER — DULOXETINE HCL 30 MG PO CPEP
30.0000 mg | ORAL_CAPSULE | Freq: Every day | ORAL | Status: DC
  Administered 2019-01-12: 30 mg via ORAL
  Filled 2019-01-12: qty 1

## 2019-01-12 MED ORDER — ONDANSETRON HCL 4 MG/2ML IJ SOLN: 4.0000 mg | Freq: Once | INTRAMUSCULAR | Status: DC | PRN

## 2019-01-12 MED ORDER — BUTAMBEN-TETRACAINE-BENZOCAINE 2-2-14 % EX AERO
INHALATION_SPRAY | CUTANEOUS | Status: DC | PRN
  Administered 2019-01-12: 1 via TOPICAL

## 2019-01-12 MED ORDER — CEFAZOLIN SODIUM-DEXTROSE 2-4 GM/100ML-% IV SOLN
INTRAVENOUS | Status: AC
  Filled 2019-01-12: qty 100

## 2019-01-12 MED ORDER — SIMETHICONE 80 MG PO CHEW: 40.0000 mg | CHEWABLE_TABLET | Freq: Four times a day (QID) | ORAL | Status: DC | PRN

## 2019-01-12 MED ORDER — HYDROCODONE-ACETAMINOPHEN 7.5-325 MG PO TABS: 1.0000 | ORAL_TABLET | Freq: Once | ORAL | Status: DC | PRN

## 2019-01-12 MED ORDER — HYDROMORPHONE HCL 1 MG/ML IJ SOLN
0.2500 mg | INTRAMUSCULAR | Status: DC | PRN
  Administered 2019-01-12: 0.5 mg via INTRAVENOUS
  Filled 2019-01-12: qty 0.5

## 2019-01-12 MED ORDER — PROPOFOL 10 MG/ML IV BOLUS
INTRAVENOUS | Status: AC
  Filled 2019-01-12: qty 20

## 2019-01-12 MED ORDER — PROPOFOL 10 MG/ML IV BOLUS
INTRAVENOUS | Status: DC | PRN
  Administered 2019-01-12 (×7): 20 mg via INTRAVENOUS

## 2019-01-12 MED ORDER — ONDANSETRON 4 MG PO TBDP: 4.0000 mg | ORAL_TABLET | Freq: Four times a day (QID) | ORAL | Status: DC | PRN

## 2019-01-12 MED ORDER — CEFAZOLIN SODIUM-DEXTROSE 2-4 GM/100ML-% IV SOLN
2.0000 g | INTRAVENOUS | Status: AC
  Administered 2019-01-12: 2 g via INTRAVENOUS

## 2019-01-12 MED ORDER — FENTANYL CITRATE (PF) 100 MCG/2ML IJ SOLN
INTRAMUSCULAR | Status: AC
  Filled 2019-01-12: qty 2

## 2019-01-12 MED ORDER — LIDOCAINE HCL (PF) 1 % IJ SOLN
INTRAMUSCULAR | Status: DC | PRN
  Administered 2019-01-12: 5 mL
  Administered 2019-01-12: 13 mL

## 2019-01-12 MED ORDER — PANTOPRAZOLE SODIUM 40 MG PO TBEC
40.0000 mg | DELAYED_RELEASE_TABLET | Freq: Every day | ORAL | Status: DC
  Administered 2019-01-13: 40 mg via ORAL
  Filled 2019-01-12: qty 1

## 2019-01-12 MED ORDER — KETOROLAC TROMETHAMINE 30 MG/ML IJ SOLN
30.0000 mg | Freq: Once | INTRAMUSCULAR | Status: AC | PRN
  Administered 2019-01-12: 30 mg via INTRAVENOUS
  Filled 2019-01-12: qty 1

## 2019-01-12 MED ORDER — BUTAMBEN-TETRACAINE-BENZOCAINE 2-2-14 % EX AERO
INHALATION_SPRAY | CUTANEOUS | Status: AC
  Filled 2019-01-12: qty 5

## 2019-01-12 MED ORDER — ONDANSETRON HCL 4 MG/2ML IJ SOLN: 4.0000 mg | Freq: Four times a day (QID) | INTRAMUSCULAR | Status: DC | PRN

## 2019-01-12 MED ORDER — KETAMINE HCL 10 MG/ML IJ SOLN
INTRAMUSCULAR | Status: DC | PRN
  Administered 2019-01-12: 10 mg via INTRAVENOUS
  Administered 2019-01-12: 5 mg via INTRAVENOUS

## 2019-01-12 MED ORDER — OXYCODONE HCL 5 MG PO TABS
10.0000 mg | ORAL_TABLET | Freq: Three times a day (TID) | ORAL | Status: DC
  Administered 2019-01-12 – 2019-01-13 (×2): 10 mg via ORAL
  Filled 2019-01-12 (×2): qty 2

## 2019-01-12 MED ORDER — MIDAZOLAM HCL 5 MG/5ML IJ SOLN
INTRAMUSCULAR | Status: DC | PRN
  Administered 2019-01-12: 0.5 mg via INTRAVENOUS

## 2019-01-12 MED ORDER — ETOMIDATE 2 MG/ML IV SOLN
INTRAVENOUS | Status: AC
  Filled 2019-01-12: qty 10

## 2019-01-12 MED ORDER — DIPHENHYDRAMINE HCL 50 MG/ML IJ SOLN: 12.5000 mg | Freq: Four times a day (QID) | INTRAMUSCULAR | Status: DC | PRN

## 2019-01-12 MED ORDER — MIDAZOLAM HCL 2 MG/2ML IJ SOLN
INTRAMUSCULAR | Status: AC
  Filled 2019-01-12: qty 2

## 2019-01-12 MED ORDER — DIPHENHYDRAMINE HCL 12.5 MG/5ML PO ELIX: 12.5000 mg | ORAL_SOLUTION | Freq: Four times a day (QID) | ORAL | Status: DC | PRN

## 2019-01-12 MED ORDER — LISINOPRIL 10 MG PO TABS
10.0000 mg | ORAL_TABLET | Freq: Every day | ORAL | Status: DC
  Administered 2019-01-13: 10 mg via ORAL
  Filled 2019-01-12: qty 1

## 2019-01-12 MED ORDER — LACTATED RINGERS IV SOLN
INTRAVENOUS | Status: DC
  Administered 2019-01-12 (×2): via INTRAVENOUS

## 2019-01-12 MED ORDER — NICOTINE 21 MG/24HR TD PT24
21.0000 mg | MEDICATED_PATCH | Freq: Every day | TRANSDERMAL | Status: DC
  Administered 2019-01-12 – 2019-01-13 (×2): 21 mg via TRANSDERMAL
  Filled 2019-01-12 (×2): qty 1

## 2019-01-12 MED ORDER — ARTIFICIAL TEARS OPHTHALMIC OINT
TOPICAL_OINTMENT | OPHTHALMIC | Status: AC
  Filled 2019-01-12: qty 3.5

## 2019-01-12 MED ORDER — PROPOFOL 500 MG/50ML IV EMUL
INTRAVENOUS | Status: DC | PRN
  Administered 2019-01-12: 100 ug/kg/min via INTRAVENOUS
  Administered 2019-01-12: 12:00:00 via INTRAVENOUS
  Administered 2019-01-12: 75 ug/kg/min via INTRAVENOUS

## 2019-01-12 SURGICAL SUPPLY — 40 items
APPLIER CLIP 9.375 SM OPEN (CLIP)
BAG DECANTER FOR FLEXI CONT (MISCELLANEOUS) ×4 IMPLANT
BAG DRAIN CYSTO-URO STER (DRAIN) ×4 IMPLANT
CHLORAPREP W/TINT 10.5 ML (MISCELLANEOUS) ×4 IMPLANT
CLIP APPLIE 9.375 SM OPEN (CLIP) IMPLANT
CLOTH BEACON ORANGE TIMEOUT ST (SAFETY) ×4 IMPLANT
CONNECTOR 5 IN 1 STRAIGHT STRL (MISCELLANEOUS) ×4 IMPLANT
COVER LIGHT HANDLE STERIS (MISCELLANEOUS) ×8 IMPLANT
DECANTER SPIKE VIAL GLASS SM (MISCELLANEOUS) ×4 IMPLANT
DERMABOND ADVANCED (GAUZE/BANDAGES/DRESSINGS) ×1
DERMABOND ADVANCED .7 DNX12 (GAUZE/BANDAGES/DRESSINGS) ×3 IMPLANT
DRAPE C-ARM FOLDED MOBILE STRL (DRAPES) ×4 IMPLANT
ELECT REM PT RETURN 9FT ADLT (ELECTROSURGICAL) ×4
ELECTRODE REM PT RTRN 9FT ADLT (ELECTROSURGICAL) ×3 IMPLANT
GLOVE BIO SURGEON STRL SZ 6.5 (GLOVE) ×4 IMPLANT
GLOVE BIOGEL PI IND STRL 6.5 (GLOVE) ×3 IMPLANT
GLOVE BIOGEL PI IND STRL 7.0 (GLOVE) ×3 IMPLANT
GLOVE BIOGEL PI INDICATOR 6.5 (GLOVE) ×1
GLOVE BIOGEL PI INDICATOR 7.0 (GLOVE) ×1
GLOVE SS BIOGEL STRL SZ 7.5 (GLOVE) ×3 IMPLANT
GLOVE SUPERSENSE BIOGEL SZ 7.5 (GLOVE) ×1
GOWN STRL REUS W/TWL LRG LVL3 (GOWN DISPOSABLE) ×8 IMPLANT
IV NS 500ML (IV SOLUTION) ×1
IV NS 500ML BAXH (IV SOLUTION) ×3 IMPLANT
KIT PEG SAFETY 20FR (KITS) ×8 IMPLANT
KIT PORT POWER 8FR ISP MRI (Port) ×4 IMPLANT
KIT TURNOVER KIT A (KITS) ×4 IMPLANT
MANIFOLD NEPTUNE II (INSTRUMENTS) ×4 IMPLANT
NEEDLE HYPO 25X1 1.5 SAFETY (NEEDLE) ×4 IMPLANT
PACK MINOR (CUSTOM PROCEDURE TRAY) ×4 IMPLANT
PAD ARMBOARD 7.5X6 YLW CONV (MISCELLANEOUS) ×4 IMPLANT
SET BASIN LINEN APH (SET/KITS/TRAYS/PACK) ×4 IMPLANT
SPONGE DRAIN TRACH 4X4 STRL 2S (GAUZE/BANDAGES/DRESSINGS) ×4 IMPLANT
SUT MNCRL AB 4-0 PS2 18 (SUTURE) ×4 IMPLANT
SUT PROLENE 2 0 SH 30 (SUTURE) ×4 IMPLANT
SUT VIC AB 3-0 SH 27 (SUTURE) ×1
SUT VIC AB 3-0 SH 27X BRD (SUTURE) ×3 IMPLANT
SYR 20CC LL (SYRINGE) ×4 IMPLANT
SYR CONTROL 10ML LL (SYRINGE) ×4 IMPLANT
TAPE CLOTH SURG 4X10 WHT LF (GAUZE/BANDAGES/DRESSINGS) ×4 IMPLANT

## 2019-01-12 NOTE — Anesthesia Procedure Notes (Signed)
Procedure Name: MAC Date/Time: 01/12/2019 10:33 AM Performed by: Vista Deck, CRNA Pre-anesthesia Checklist: Patient identified, Emergency Drugs available, Suction available, Timeout performed and Patient being monitored Patient Re-evaluated:Patient Re-evaluated prior to induction Oxygen Delivery Method: Nasal Cannula

## 2019-01-12 NOTE — Interval H&P Note (Signed)
History and Physical Interval Note:  01/12/2019 10:19 AM  Lance [name]  has presented today for surgery, with the diagnosis of mouth cancer  The various methods of treatment have been discussed with the patient and family. After consideration of risks, benefits and other options for treatment, the patient has consented to  Procedure(s): INSERTION PORT-A-CATH (N/A) ESOPHAGOGASTRODUODENOSCOPY (EGD) WITH PROPOFOL (N/A) PERCUTANEOUS ENDOSCOPIC GASTROSTOMY (PEG) PLACEMENT (N/A) as a surgical intervention .  The patient's history has been reviewed, patient examined, no change in status, stable for surgery.  I have reviewed the patient's chart and labs.  Questions were answered to the patient's satisfaction.    No new questions. Will stay overnight for monitoring.   Virl Cagey

## 2019-01-12 NOTE — Anesthesia Postprocedure Evaluation (Signed)
Anesthesia Post Note  Patient: Lance [name]  Procedure(s) Performed: INSERTION PORT-A-CATH (Right Chest) ESOPHAGOGASTRODUODENOSCOPY (EGD) WITH PROPOFOL (Left Mouth) PERCUTANEOUS ENDOSCOPIC GASTROSTOMY (PEG) PLACEMENT (N/A Abdomen)  Patient location during evaluation: PACU Anesthesia Type: MAC Level of consciousness: awake and alert and patient cooperative Pain management: satisfactory to patient Vital Signs Assessment: post-procedure vital signs reviewed and stable Respiratory status: spontaneous breathing Cardiovascular status: stable Postop Assessment: no apparent nausea or vomiting Anesthetic complications: no     Last Vitals:  Vitals:   01/12/19 1215 01/12/19 1230  BP: 136/84 (!) 148/84  Pulse: 60 67  Resp: 11 14  Temp:    SpO2: 95% 96%    Last Pain:  Vitals:   01/12/19 1230  TempSrc:   PainSc: 0-No pain                 YATES,TERESA

## 2019-01-12 NOTE — Anesthesia Preprocedure Evaluation (Addendum)
Anesthesia Evaluation  Patient identified by MRN, date of birth, ID band Patient awake    Reviewed: Allergy & Precautions, H&P , NPO status , Patient's Chart, lab work & pertinent test results  Airway Mallampati: II  TM Distance: >3 FB Neck ROM: full    Dental  (+) Edentulous Lower, Caps   Pulmonary COPD, Current Smoker,    Pulmonary exam normal breath sounds clear to auscultation       Cardiovascular Exercise Tolerance: Good hypertension, negative cardio ROS   Rhythm:regular Rate:Normal     Neuro/Psych PSYCHIATRIC DISORDERS Depression negative neurological ROS     GI/Hepatic Neg liver ROS, GERD  ,  Endo/Other  negative endocrine ROSdiabetes  Renal/GU negative Renal ROS  negative genitourinary   Musculoskeletal   Abdominal   Peds  Hematology negative hematology ROS (+)   Anesthesia Other Findings metastatic adenopathy  Reproductive/Obstetrics negative OB ROS                            Anesthesia Physical Anesthesia Plan  ASA: IV  Anesthesia Plan: MAC   Post-op Pain Management:    Induction:   PONV Risk Score and Plan:   Airway Management Planned:   Additional Equipment:   Intra-op Plan:   Post-operative Plan:   Informed Consent: I have reviewed the patients History and Physical, chart, labs and discussed the procedure including the risks, benefits and alternatives for the proposed anesthesia with the patient or authorized representative who has indicated his/her understanding and acceptance.       Plan Discussed with: CRNA  Anesthesia Plan Comments:         Anesthesia Quick Evaluation

## 2019-01-12 NOTE — Plan of Care (Signed)

## 2019-01-12 NOTE — Progress Notes (Signed)
PEG tube drainage bag emptied for 1300 mL at this time.  Tube disconnected from drainage bag and clamped per orders.  Patient medicated for mild pain with PO pain meds.  Tolerating meal brought in by family at this time.

## 2019-01-12 NOTE — Progress Notes (Signed)
Initial Nutrition Assessment  DOCUMENTATION CODES:  Non-severe (moderate) malnutrition in context of chronic illness  INTERVENTION:  RD provided education regarding: nutrition during cancer tx, how to maximize oral intake, PEG maintenance, home health, tube feeding administration, potential nutrition-related side effects of treatment and answered related questions  Spoke w/ ST and [facility] regarding order of preliminary ST evaluation  Reached out to [facility] regarding pt onboarding.   NUTRITION DIAGNOSIS:  Increased nutrient needs related to cancer and cancer related treatments as evidenced by estimated nutrition requirements for chemoradiation   GOAL:  Patient will meet greater than or equal to 90% of their needs  MONITOR:  PO intake, Supplement acceptance, Labs, I & O's, Diet advancement  REASON FOR ASSESSMENT:  New PEG for H&N cancer  ASSESSMENT:  68 y/o male PMHx DM2, COPD, HTN, depression. Pt had been seen OP by ENT specialist d/t pain in bottom of mouth x1 yr. This same issue had been investigated in Jan 2019, but CT showed no findings. However 4 wks ago, pt noted new L neck mass and this time repeat CT did show a mass in base of mouth. S/p Biopsy 1/28 +[facility]. Subsequent PET showed involvement of L Lvl 1 lymph node and possibly the L submandibular gland. Presents for preemptive PEG in preparation for chemoradiation.   Met w/ pt following his cath/peg placement to perform initial comprehensive nutrition assessment.   RD first sought information regarding his baseline eating habits. He says he typically eats a diverse diet and eats 3 meals each day. He lives with his wife and she is the one who prepares his dinner. He says for dinner last night he had cubed steak (extremely tender), crescent rolls and potatoes. For lunch, he usually has some sort of sandwich. His last breakfast was a sausage biscuit. He drinks soda, coffee and water.  Since developing mouth pain >1 year ago, he  has not noticed had any changes in his appetite. However, he has had to alter the composition of his diet. He says that, due to his L sided mouth pain, he can only eat using the R side of his mouth. The pain has also made it so he cannot wear his bottom dentures. As such, he has been unable to eat his habitual, hard snack items-chips, cookies, pretzels. He now is restricted to items that require little chewing. RD asked if he felt his pain w/ chewing impacted his total intake - he says "probably".    Regarding his DM2, he has been a diabetic for "7-8 yrs". He manages his BG w/ metformin and invokana. He checks his BG daily and they usually run 110-160.   Weight wise, he reports wt loss. He says his UBW is 203 lbs. He says he was this much when his mouth pain began over a year ago. He says he had attributed his weight loss to his diabetes. His report does seem to be corroborated by chart hx. He was 198 in June of 2018 and he presented at the End of this recent January for biopsy at 186.3 lbs.   RD reviewed reasoning behind his PEG placement and how nutrition status is typically impacted during H&N treatment. RD noted that he will receive a referral to ST and explained reasoning behind this as well. RD told pt his estimated kcal/protein needs during treatment and how he can meet these orally - frequent energy/protein dense meals, liberal use of condiments and nutritional supplements. RD touched on Ensure assistance program at [facility].  RD provided some PEG educated-touched on site maintenance and the basic PEG/TF supplies he will receive. Regarding supply allocation, He was hesitant to allow Home health to come out to his place- "its a pig sty", but upon explaining their function he agreed. He was ok with referral to [facility].   To help familiarize himself with his PEG and to provide extra nutrition in light of weight loss, RD instructed pt to start by infusing 2 cans of Osmolite 1.5 each day. One in morning and  one at night. RD showed him a 60 cc syring and how he will infuse flushes and gravity feeds. Explained formula storage and safe tube feeding practices. RD answered pt questions to best of his ability. He did seem slightly overwhelmed. He didn't know he would receive chemo and radiation on the same day. He had been told he "might need the tube for 2 months"- RD thought this was optimistic-briefly reviewed post-tx course and peg weaning.   Labs: A1C on 2/3: 7.0 Meds: PPI, oxycodone, Invokana  NUTRITION - FOCUSED PHYSICAL EXAM:   Most Recent Value  Orbital Region  Mild depletion  Upper Arm Region  Severe depletion  Thoracic and Lumbar Region  Moderate depletion  Buccal Region  No depletion  Temple Region  Mild depletion  Clavicle Bone Region  Mild depletion  Clavicle and Acromion Bone Region  Mild depletion  Scapular Bone Region  Mild depletion  Dorsal Hand  No depletion  Patellar Region  No depletion  Anterior Thigh Region  Moderate depletion  Posterior Calf Region  Moderate depletion  Edema (RD Assessment)  None  Hair  Reviewed  Eyes  Reviewed  Mouth  Reviewed  Skin  Reviewed  Nails  Reviewed       Diet Order:   Diet Order            Diet regular Room service appropriate? Yes; Fluid consistency: Thin  Diet effective now        Diet NPO time specified Except for: Ice Chips  Diet effective now             EDUCATION NEEDS:  Education needs have been addressed  Skin: S/P port and PEG  Last BM:  Unknown  Height:  Ht Readings from Last 1 Encounters:  01/12/19 6' 2"  (1.88 m)   Weight:  Wt Readings from Last 1 Encounters:  01/12/19 83.4 kg   Wt Readings from Last 10 Encounters:  01/12/19 83.4 kg  01/11/19 83.5 kg  01/10/19 84.5 kg  01/04/19 84.5 kg  06/03/17 89.8 kg  03/11/15 93 kg   Ideal Body Weight:  86.36 kg  BMI:  Body mass index is 23.61 kg/m.  Estimated Nutritional Needs (during chemoradiation):  Kcal:  2500-2750 kcals (30-33 kcal/kg bw) Protein:   125-140 g Pro (1.5-1.7g/kg bw) Fluid:  2.5-2.8 L fluid (28m/kcal)  NBurtis JunesRD, LDN, CNSC Clinical Nutrition Available Tues-Sat via Pager: [phone]/04/2019 5:29 PM

## 2019-01-12 NOTE — Op Note (Signed)
Operative Note 01/12/19   Preoperative Diagnosis:  Oropharyngeal cancer   Postoperative Diagnosis: Same   Procedure(s) Performed: Port-A-Cath placement, right subclavian and Percutaneous Endoscopic Gastrostomy tube placement    Surgeon: Lanell Matar. Constance Haw, MD   Assistants: No qualified resident was available   Anesthesia: Monitored anesthesia care   Anesthesiologist: Nicanor Alcon, MD    Specimens: None   Estimated Blood Loss: Minimal   Blood Replacement: None    Complications: None    Operative Findings: Normal vascular anatomy, no gastritis, normal rugae   Indications: Mr. Maniscalco is a 68 yo with a newly diagnosed oropharyngeal cancer who presented for port a catheter placement and percutaneous endoscopic gastrostomy tube placement due to his upcoming radiation therapy and worry about oral intake.  He had a history of mouth/ tongue pain and was ultimately diagnosed after a CT scan demonstrated a mass in the floor of his mouth.  We discussed the risk and benefits of the port a catheter placement including but not limited to bleeding, infection, malfunction, and risk of pneumothorax, and we discussed the risk and benefit of percutaneous gastrostomy tube placement including but not limited to bleeding, need for an open surgery, need for the tube to stay in for at least 8 weeks, and risk of injury to other organs or misplacement, and he opted to proceed.   Procedure: The patient was brought into the operating room and monitored anesthesia care was induced. A JACHO approved timeout was performed. One percent lidocaine was used for local anesthesia.   The right chest and neck was prepped and draped in the usual sterile fashion.  Preoperative antibiotics were given.  An incision was made below the right clavicle. A subcutaneous pocket was formed. The needles advanced into the right subclavian vein using the Seldinger technique without difficulty. A guidewire was then advanced into the right  atrium under fluoroscopic guidance.  Ectopia was noted and the wire was pulled back. An introducer and peel-away sheath were placed over the guidewire. The catheter was then inserted through the peel-away sheath and the peel-away sheath was removed.  A spot film was performed to confirm the position. The catheter was then attached to the port and the port placed in subcutaneous pocket. Adequate positioning was confirmed by fluoroscopy. Hemostasis was confirmed, and the port was secured with 2-0 prolene sutures.  Good backflow of blood was noted on aspiration of the port. The port was flushed with heparin flush. Subcutaneous layer was reapproximated using a 3-0 Vicryl interrupted suture. The skin was closed using a 4-0 Vicryl subcuticular suture. Dermabond was applied.  All tape and needle counts were correct at the end of this portion of the procedure.  The drapes were removed, and the endoscopy equipment was arranged.  The patient's abdomen was clipped. The patient's sedation was confirmed and topical analgesia was placed in the oropharynx.  The mouth piece was inserted, and the endoscope was passed down into the stomach.  No abnormalities were noted.  The stomach was insufflated with air an the endoscope was positioned in the midportion and directed to the anterior abdominal wall.  A good light reflex was noted on the skin in the left upper quadrant.  Finger pressure was applied at the light reflex and an adequate indention was noted.  A snare was passed into the stomach, opened fully and positioned so the that the loop encircled the point of demonstrated indentation.  My assistant then held the endoscope. I put on sterile gloves and prepped the  skin.  The skin overlying the site was anaesthetized with lidocaine and a 1 cm incision was made. The introducer needle was passed through the incision and into the stomach under visualization with the gastroscope.  The needle and catheter were gently captured by the  snare.  The guide wire was passed and snared.  I think pulled the endoscope, snare, and guide wire were pulled back into the mouth. The gastrostomy tube was attached to the loop on the guide wire and the whole thing was pulled back into the stomach until the 2.5 cm mark of the gastrostomy tube was noted at the skin level and 3.5cm mark was noted at the top of the bumper.  The gastroscope demonstrated adequate positioning of the tube and a picture was taken.  The endoscope was removed and the stomach was desufflated. The gastrostomy tube was cut to length and the clamping appendage was placed. A collection bag was placed.   Triple antibiotic was placed around the tube and a small drain sponge was placed under the bumper.  The PEG was secured to the skin with drain sponge and paper tape. the procedure.   All counts were correct. The patient was transferred to PACU in stable condition. A chest x-ray will be performed at that time to confirm the port a catheter placement. The patient will remain overnight for observation and the PEG will be to gravity until Katie, MD [facility] [place], [place] [address] [phone] (office)

## 2019-01-12 NOTE — Transfer of Care (Signed)
Immediate Anesthesia Transfer of Care Note  Patient: Lance [name]  Procedure(s) Performed: INSERTION PORT-A-CATH (Right Chest) ESOPHAGOGASTRODUODENOSCOPY (EGD) WITH PROPOFOL (Left Mouth) PERCUTANEOUS ENDOSCOPIC GASTROSTOMY (PEG) PLACEMENT (N/A Abdomen)  Patient Location: PACU  Anesthesia Type:MAC  Level of Consciousness: awake, alert  and oriented  Airway & Oxygen Therapy: Patient Spontanous Breathing  Post-op Assessment: Report given to RN  Post vital signs: Reviewed and stable  Last Vitals:  Vitals Value Taken Time  BP 145/86 01/12/2019 12:02 PM  Temp    Pulse 61 01/12/2019 12:08 PM  Resp 12 01/12/2019 12:08 PM  SpO2 96 % 01/12/2019 12:08 PM  Vitals shown include unvalidated device data.  Last Pain:  Vitals:   01/12/19 0909  TempSrc: Oral  PainSc:       Patients Stated Pain Goal: 5 (17/40/81 4481)  Complications: No apparent anesthesia complications

## 2019-01-12 NOTE — Progress Notes (Signed)
[facility]  Abdominal binder to stay in place. PEG to gravity bag until 6pm. At 6pm if has been doing well, can clamp the PEG and start regular diet. Can start oral meds at 6pm also.  Will discharge in the AM. The cancer center is setting up the [facility] and Nutrition once the patient needs to use the PEG.   Curlene Labrum, MD [facility] [address] [place], [place] [address] [phone] (office)

## 2019-01-12 NOTE — Progress Notes (Signed)
I received order from dietician, Burtis Junes, to have patient seen by speech pathology for pretreatment evaluation.  Orders placed and ST aware.

## 2019-01-13 ENCOUNTER — Encounter (HOSPITAL_COMMUNITY): Payer: Self-pay | Admitting: General Surgery

## 2019-01-13 ENCOUNTER — Other Ambulatory Visit: Payer: Self-pay

## 2019-01-13 ENCOUNTER — Inpatient Hospital Stay (HOSPITAL_COMMUNITY): Payer: Medicare Other | Attending: Hematology | Admitting: Hematology

## 2019-01-13 VITALS — BP 175/93 | HR 85 | Temp 98.5°F | Resp 16 | Wt 188.0 lb

## 2019-01-13 DIAGNOSIS — I10 Essential (primary) hypertension: Secondary | ICD-10-CM | POA: Diagnosis not present

## 2019-01-13 DIAGNOSIS — C069 Malignant neoplasm of mouth, unspecified: Secondary | ICD-10-CM

## 2019-01-13 DIAGNOSIS — R61 Generalized hyperhidrosis: Secondary | ICD-10-CM | POA: Diagnosis not present

## 2019-01-13 DIAGNOSIS — C049 Malignant neoplasm of floor of mouth, unspecified: Secondary | ICD-10-CM | POA: Diagnosis not present

## 2019-01-13 DIAGNOSIS — Z6823 Body mass index (BMI) 23.0-23.9, adult: Secondary | ICD-10-CM | POA: Diagnosis not present

## 2019-01-13 DIAGNOSIS — F329 Major depressive disorder, single episode, unspecified: Secondary | ICD-10-CM | POA: Diagnosis not present

## 2019-01-13 DIAGNOSIS — E440 Moderate protein-calorie malnutrition: Secondary | ICD-10-CM

## 2019-01-13 DIAGNOSIS — C109 Malignant neoplasm of oropharynx, unspecified: Secondary | ICD-10-CM | POA: Diagnosis not present

## 2019-01-13 DIAGNOSIS — E119 Type 2 diabetes mellitus without complications: Secondary | ICD-10-CM | POA: Diagnosis not present

## 2019-01-13 DIAGNOSIS — E1142 Type 2 diabetes mellitus with diabetic polyneuropathy: Secondary | ICD-10-CM | POA: Insufficient documentation

## 2019-01-13 MED ORDER — HYDROCODONE-ACETAMINOPHEN 5-325 MG PO TABS: 1.0000 | ORAL_TABLET | Freq: Four times a day (QID) | ORAL | 0 refills | Status: DC | PRN

## 2019-01-13 NOTE — Plan of Care (Signed)

## 2019-01-13 NOTE — Assessment & Plan Note (Addendum)
1.  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  A CT of the soft tissue of the neck in January 2019 was normal. -50+ pack year smoker.  Does not report any difficulty eating.  Denies any weight loss. -He was reevaluated by Dr. Leonarda Salon on 01/03/2019.  He was found to have an ulcerative mass along the floor of the mouth with level 1 lymphadenopathy.  Flexible fiberoptic laryngoscopy did not reveal any other lesions. - Biopsy on 01/04/2019 shows invasive moderately to poorly differentiated squamous cell carcinoma.  P 16 was negative. - PET/CT scan on 01/10/2019 shows floor of the mouth lesion on the left with a left level 1 lymph node which was hypermetabolic.  There is a 2.7 x 2.1 mm thick-walled cavitary lesion in the left upper lobe not demonstrating any hypermetabolism.  CT of chest was recommended in 4 to 6 months.  No other hypermetabolic disease. -I have discussed findings on the PET CT scan and the pathology report in detail with the patient, his wife and his daughter. -He had a PEG tube placed on 01/12/2019. - He has an appointment to see Dr. Donald Pore at [facility] on 01/17/2019. -He is complaining of continuous pain in the left side of the jaw.  He is currently on OxyContin 10 mg twice daily for his back pain, which he gets it from pain clinic.  I will give him a temporary supply of hydrocodone 5 mg p.o. every 6 hours as needed. -I will see him back 4 weeks after surgery.  2.  Peripheral neuropathy: -He has neuropathy in the feet for the last few years.  He has diabetes of 10 years duration. -He takes gabapentin 800 mg 3 times daily.

## 2019-01-13 NOTE — Care Management Obs Status (Signed)
[facility] NOTIFICATION   Patient Details  Name: FUQUAN WILSON MRN: [id number] Date of Birth: [birth year]   Medicare Observation Status Notification Given:  Yes    Sherald Barge, RN 01/13/2019, 10:49 AM

## 2019-01-13 NOTE — Discharge Summary (Signed)
Physician Discharge Summary  Patient ID: Lance [name] MRN: [id number] DOB/AGE: 1951/06/03 68 y.o.  Admit date: 01/12/2019 Discharge date: 01/13/2019  Admission Diagnoses: Oropharyngeal cancer   Discharge Diagnoses:  Active Problems:   Cancer of mouth (HCC)   Malnutrition of moderate degree   Discharged Condition: good  Hospital Course: Lance [name] is a 68 yo with a floor of the mouth cancer who had a port an a PEG placed so that he can begin his treatments. He was kept overnight for monitoring after the PEG. The Peg was placed to gravity for 6 hours and he did well. After that he was allowed to eat.  He was tolerating a good diet and had good pain control prior to his discharge. Nutrition saw him in the hospital and Home Health is being arranged by Dr. Marice Potter team in the Cancer Center.   Consults: None  Significant Diagnostic Studies: None    Discharge Exam: Blood pressure (!) 166/82, pulse 80, temperature 99.2 F (37.3 C), temperature source Oral, resp. rate 16, height 6\' 2"  (1.88 m), weight 83.4 kg, SpO2 96 %. General appearance: alert, cooperative, no distress and right chest port site with dermabond, no erythema or drainage Resp: normal work of breathing GI: soft, mildly distended, tender around PEG, PEG flushed easily, retaped and Abdominal binder placed Extremities: extremities normal, atraumatic, no cyanosis or edema  Disposition: Discharge disposition: 01-Home or Self Care       Discharge Instructions    Call MD for:  difficulty breathing, headache or visual disturbances   Complete by:  As directed    Call MD for:  extreme fatigue   Complete by:  As directed    Call MD for:  persistant dizziness or light-headedness   Complete by:  As directed    Call MD for:  persistant nausea and vomiting   Complete by:  As directed    Call MD for:  redness, tenderness, or signs of infection (pain, swelling, redness, odor or green/yellow discharge around incision site)    Complete by:  As directed    Call MD for:  severe uncontrolled pain   Complete by:  As directed    Call MD for:  temperature >100.4   Complete by:  As directed    Diet - low sodium heart healthy   Complete by:  As directed    Increase activity slowly   Complete by:  As directed      Allergies as of 01/13/2019   No Known Allergies     Medication List    TAKE these medications   albuterol 108 (90 Base) MCG/ACT inhaler Commonly known as:  PROVENTIL HFA;VENTOLIN HFA Inhale 1 puff into the lungs every 6 (six) hours as needed for wheezing or shortness of breath.   baclofen 10 MG tablet Commonly known as:  LIORESAL Take 10 mg by mouth at bedtime.   colchicine 0.6 MG tablet Take 0.6 mg by mouth 2 (two) times daily.   DULoxetine 30 MG capsule Commonly known as:  CYMBALTA Take 30 mg by mouth at bedtime.   fenofibrate 160 MG tablet Take 160 mg by mouth daily.   gabapentin 800 MG tablet Commonly known as:  NEURONTIN Take 800 mg by mouth 3 (three) times daily.   INVOKANA 100 MG Tabs tablet Generic drug:  canagliflozin Take 100 mg by mouth daily before breakfast.   lisinopril 10 MG tablet Commonly known as:  PRINIVIL,ZESTRIL Take 10 mg by mouth daily.   metFORMIN 1000 MG tablet Commonly  known as:  GLUCOPHAGE Take 1,000 mg by mouth 2 (two) times daily with a meal.   omeprazole 20 MG capsule Commonly known as:  PRILOSEC Take 20 mg by mouth daily.   Oxycodone HCl 10 MG Tabs Take 10 mg by mouth 3 (three) times daily.   pravastatin 10 MG tablet Commonly known as:  PRAVACHOL Take 10 mg by mouth daily with supper.      Follow-up Information    Lucretia Roers, MD Follow up in 2 week(s).   Specialty:  General Surgery Why:  PEG Check Contact information: [address] [place] [facility] [address] [phone]           Signed: Lucretia Roers 01/13/2019, 12:35 PM

## 2019-01-13 NOTE — Care Management (Signed)
Pt with new PEG placement. Pt being DC'd today and will be seen in cancer center where his [facility] referral will be made for tube feeding. [facility] rep has been made aware of referral by dietician and will f/u with cancer center for orders

## 2019-01-13 NOTE — Discharge Instructions (Signed)
PEG Care (Feeding Tube Care): -Flush the tube twice a day with 20cc of water from the tap.  -Make sure the top of your bumper remains at 3.5 mark. It is important to keep it at this mark so that the tube is not too tight or too loose.  -Keep the tube taped down when not in use or with activity. -Wear an abdominal binder, or ace wrap around your waste to keep the tube in place, especially with sleeping.  -Follow your feeding instructions per your home health care RN.  -If your tube falls out or gets pulled out, go to the ED immediately. If it is within 6-8 weeks, there is a possibility that you will need surgery.   After 6-8 weeks, a new tube can be reinserted, but has to be done quickly because the hole heals up fast.  -You may shower and dry the tube off after a showering. DO NOT SUBMERGE IN A BATH.   Diet/ Activity: Diet as tolerated.  Walk everyday for at least 15-20 minutes. Deep cough and move around every 1-2 hours in the first few days after surgery.  Do not lift > 10 lbs, perform excessive bending, pushing, pulling, squatting for 1-2 weeks after surgery.   Medication: Take tylenol and ibuprofen as needed for pain control, alternating every 4-6 hours.  Example:  Tylenol 1000mg  @ 6am, 12noon, 6pm, 77midnight (Do not exceed 4000mg  of tylenol a day). Ibuprofen 800mg  @ 9am, 3pm, 9pm, 3am (Do not exceed 3600mg  of ibuprofen a day).  Take Your Home Roxicodone for breakthrough pain. Take Colace for constipation related to narcotic pain medication. If you do not have a bowel movement in 2 days, take Miralax over the counter.  Drink plenty of water to also prevent constipation.   Contact Information: If you have questions or concerns, please call our office, [phone], Monday- Thursday 8AM-5PM and Friday 8AM-12Noon.  If it is after hours or on the weekend, please call [facility] Main [facility], [phone], and ask to speak to the surgeon on call for Dr. Constance Haw at [facility].    PEG Tube Home  Guide A PEG tube is used to put food and fluids into the stomach. Before you leave the hospital, make sure that you know:  How to care for your PEG tube.  How to care for the opening (stoma) in your belly.  How to give yourself a feeding.  How to give yourself medicines.  When to call your doctor for help. Supplies needed:  [employer], plain water  Clean washcloth  Gauze pad  Syringe How to care for a PEG tube Check your PEG tube every day. Make sure:  It is not too tight.  It is in the correct place. There is a mark on the tube that shows when the tube is in the correct place. Adjust the tube if you need to. Cleaning your stoma Clean your stoma every day. Follow these steps: 1. Wash your hands with soap and water. If soap and water are not available, use hand sanitizer. 2. Check your stoma. Let your doctor know if there is: ? Redness. ? Leaking. ? Skin irritation. 3. Wash the stoma gently with warm, soapy water. 4. Rinse the stoma with plain water. 5. Pat the stoma area dry. 6. You may place a gauze pad around the opening of your stoma.  Giving a feeding Your doctor will tell you:  How much nutrition and fluid you will need for each feeding.  How  often to have a feeding.  Whether you should take medicine in the tube by itself or with a feeding. To give yourself a feeding, follow these steps: 1. Lay out all of the things that you will need. 2. Make sure that the nutritional formula is at room temperature. 3. Wash your hands with soap and water. 4. Sit up or stand up straight. You will need to stay sitting up or standing up while you give yourself a feeding. 5. Make sure the syringe plunger is pushed in. Place the tip of the syringe in clean water, and slowly pull the plunger to bring (draw up) the water into the syringe. 6. Remove the clamp and the cap from the PEG tube. 7. Push the water out of the syringe to clean (flush) the tube. 8. If the tube is  clear, draw up the formula into the syringe. Make sure to use the right amount for each feeding. Add water if you need to. 9. Slowly push the formula from the syringe through the tube. 10. After the feeding, flush the tube with water. 11. Put the clamp and the cap on the tube. 12. Stay sitting up or standing up straight for at least 30 minutes. Giving medicine To give yourself medicine, follow these steps: 1. Lay out all of the things that you will need. 2. If your medicine is in tablet form, crush it and dissolve it in water. 3. Wash your hands with soap and water. 4. Sit up or stand up straight. You will need to stay sitting up or standing up while you give yourself medicine. 5. Make sure the syringe plunger is pushed in. Place the tip of the syringe in clean water, and slowly pull the plunger to bring (draw up) the water into the syringe. 6. Remove the clamp and the cap from the PEG tube. 7. Push the water out of the syringe to clean (flush) the tube. 8. If the tube is clear, draw up the medicine into the syringe. 9. Slowly push the medicine from the syringe through the tube. 10. Flush the tube with water. 11. Put the clamp and the cap on the tube. 12. Stay sitting up or standing up straight for at least 30 minutes. Do not take sustained release (SR) medicines through your tube. If you are not sure if your medicine is a SR medicine, ask your doctor. Contact a doctor if:  The area around your stoma is sore, irritated, or red.  You have belly pain or bloating while you are feeding, or after you feed.  You feel sick to your stomach (nausea) for a long time.  You cannot poop (constipated) or you have watery poop (diarrhea) for a long time.  You have a fever.  You have problems with your PEG tube. Get help right away if:  Your tube is blocked.  Your tube falls out.  You have pain around your tube.  You are bleeding from your tube.  Your tube is leaking.  You choke or you  have trouble breathing while you are feeding or after you feed. Summary  A PEG tube is used to put food and fluids into the stomach.  Before you leave the hospital, you will be taught how to use and care for your PEG tube.  Your doctor will give you instructions on how to give yourself feedings and medicines.  Contact your doctor if you have fever or soreness, redness, or irritation around your stoma.  Get help right away  if your tube leaks, is blocked, or falls out. Also, get help right away if you have pain or bleeding around your stoma. This information is not intended to replace advice given to you by your health care provider. Make sure you discuss any questions you have with your health care provider. Document Released: 12/27/2010 Document Revised: 12/07/2017 Document Reviewed: 12/07/2017 Elsevier Interactive Patient Education  2019 [employer].

## 2019-01-13 NOTE — Plan of Care (Signed)
  Problem: Education: Goal: Knowledge of General Education information will improve Description Including pain rating scale, medication(s)/side effects and non-pharmacologic comfort measures 01/13/2019 1309 by Rance Muir, RN Outcome: Adequate for Discharge 01/13/2019 0759 by Rance Muir, RN Outcome: Progressing   Problem: Health Behavior/Discharge Planning: Goal: Ability to manage health-related needs will improve 01/13/2019 1309 by Rance Muir, RN Outcome: Adequate for Discharge 01/13/2019 0759 by Rance Muir, RN Outcome: Progressing   Problem: Clinical Measurements: Goal: Ability to maintain clinical measurements within normal limits will improve 01/13/2019 1309 by Rance Muir, RN Outcome: Adequate for Discharge 01/13/2019 0759 by Rance Muir, RN Outcome: Progressing Goal: Will remain free from infection 01/13/2019 1309 by Rance Muir, RN Outcome: Adequate for Discharge 01/13/2019 0759 by Rance Muir, RN Outcome: Progressing Goal: Diagnostic test results will improve 01/13/2019 1309 by Rance Muir, RN Outcome: Adequate for Discharge 01/13/2019 0759 by Rance Muir, RN Outcome: Progressing Goal: Respiratory complications will improve 01/13/2019 1309 by Rance Muir, RN Outcome: Adequate for Discharge 01/13/2019 0759 by Rance Muir, RN Outcome: Progressing Goal: Cardiovascular complication will be avoided 01/13/2019 1309 by Rance Muir, RN Outcome: Adequate for Discharge 01/13/2019 0759 by Rance Muir, RN Outcome: Progressing   Problem: Activity: Goal: Risk for activity intolerance will decrease 01/13/2019 1309 by Rance Muir, RN Outcome: Adequate for Discharge 01/13/2019 0759 by Rance Muir, RN Outcome: Progressing   Problem: Nutrition: Goal: Adequate nutrition will be maintained 01/13/2019 1309 by Rance Muir, RN Outcome: Adequate for Discharge 01/13/2019 0759 by Rance Muir, RN Outcome: Progressing   Problem: Coping: Goal: Level of anxiety will decrease 01/13/2019 1309 by Rance Muir, RN Outcome: Adequate for Discharge 01/13/2019  0759 by Rance Muir, RN Outcome: Progressing   Problem: Elimination: Goal: Will not experience complications related to bowel motility 01/13/2019 1309 by Rance Muir, RN Outcome: Adequate for Discharge 01/13/2019 0759 by Rance Muir, RN Outcome: Progressing Goal: Will not experience complications related to urinary retention 01/13/2019 1309 by Rance Muir, RN Outcome: Adequate for Discharge 01/13/2019 0759 by Rance Muir, RN Outcome: Progressing   Problem: Pain Managment: Goal: General experience of comfort will improve 01/13/2019 1309 by Rance Muir, RN Outcome: Adequate for Discharge 01/13/2019 0759 by Rance Muir, RN Outcome: Progressing   Problem: Safety: Goal: Ability to remain free from injury will improve 01/13/2019 1309 by Rance Muir, RN Outcome: Adequate for Discharge 01/13/2019 0759 by Rance Muir, RN Outcome: Progressing   Problem: Skin Integrity: Goal: Risk for impaired skin integrity will decrease 01/13/2019 1309 by Rance Muir, RN Outcome: Adequate for Discharge 01/13/2019 [place] by Rance Muir, RN Outcome: Progressing
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
## 2019-01-13 NOTE — Care Management CC44 (Signed)
Condition Code 44 Documentation Completed  Patient Details  Name: Lance [name] MRN: [id number] Date of Birth: Feb 03, 1951   Condition Code 44 given:  Yes Patient signature on Condition Code 44 notice:  Yes Documentation of 2 MD's agreement:  Yes Code 44 added to claim:  Yes    Sherald Barge, RN 01/13/2019, 10:49 AM

## 2019-01-13 NOTE — Patient Instructions (Signed)
[facility] Discharge Instructions     Thank you for choosing [facility] to provide your oncology and hematology care.  To afford each patient quality time with our provider, please arrive at least 15 minutes before your scheduled appointment time.   If you have a lab appointment with the Cancer Center please come in thru the  Main Entrance and check in at the main information desk  You need to re-schedule your appointment should you arrive 10 or more minutes late.  We strive to give you quality time with our providers, and arriving late affects you and other patients whose appointments are after yours.  Also, if you no show three or more times for appointments you may be dismissed from the clinic at the providers discretion.     Again, thank you for choosing [facility].  Our hope is that these requests will decrease the amount of time that you wait before being seen by our physicians.       _____________________________________________________________  Should you have questions after your visit to [facility], please contact our office at [phone] between the hours of 8:00 a.m. and 4:30 p.m.  Voicemails left after 4:00 p.m. will not be returned until the following business day.  For prescription refill requests, have your pharmacy contact our office and allow 72 hours.    Cancer Center Support Programs:   > Cancer Support Group  2nd Tuesday of the month 1pm-2pm, Journey Room    

## 2019-01-13 NOTE — Addendum Note (Signed)
Addendum  created 01/13/19 0846 by Vista Deck, CRNA   Intraprocedure Event edited, Intraprocedure Meds edited

## 2019-01-14 ENCOUNTER — Encounter: Payer: Self-pay | Admitting: General Practice

## 2019-01-14 NOTE — Progress Notes (Signed)
[facility] Psychosocial Distress Screening Clinical Social Work  Clinical Social Work was referred by distress screening protocol.  The patient scored a 7 on the Psychosocial Distress Thermometer which indicates moderate distress. Clinical Social Worker contacted patient by phone to assess for distress and other psychosocial needs.  Patient asleep, spoke w significant other in the home.  Reports patient is doing well so far, no concerns w transportation, help at home or adjustment.  Advised to contact  CSW w needs that may arise in future.    ONCBCN DISTRESS SCREENING 01/13/2019  Screening Type Initial Screening  Distress experienced in past week (1-10) 7  Physical Problem type Pain;Mouth sores/swallowing;Tingling hands/feet  Physician notified of physical symptoms Yes  Referral to clinical psychology No  Referral to clinical social work No  Referral to dietition No  Referral to financial advocate No  Referral to support programs No  Referral to palliative care No   Clinical Social Worker follow up needed: No.  If yes, follow up plan:  Beverely Pace, [place], LCSW Clinical Social Worker Phone:  [phone]

## 2019-01-14 NOTE — Progress Notes (Signed)
I met with patient and his family during office visit. I reviewed the plan after the physician left and helped answer questions that they had.  I provided them with my contact information in writing and advised them to call with any questions.

## 2019-01-17 DIAGNOSIS — C049 Malignant neoplasm of floor of mouth, unspecified: Secondary | ICD-10-CM | POA: Diagnosis not present

## 2019-01-17 DIAGNOSIS — F17210 Nicotine dependence, cigarettes, uncomplicated: Secondary | ICD-10-CM | POA: Diagnosis not present

## 2019-01-18 ENCOUNTER — Telehealth (HOSPITAL_COMMUNITY): Payer: Self-pay | Admitting: *Deleted

## 2019-01-18 ENCOUNTER — Encounter (HOSPITAL_COMMUNITY): Payer: Medicare Other | Admitting: Dietician

## 2019-01-18 ENCOUNTER — Encounter (HOSPITAL_COMMUNITY): Payer: Self-pay | Admitting: Dietician

## 2019-01-18 NOTE — Progress Notes (Signed)
Pt is a no-show for his appt today. Apparently, daughter called nursing this morning and reported that the pt is having severe pain at PEG site. Daughter calling to request pain medication. He has f/u with surgeon Thursday.  Of note. [facility] nurve nav reports that Pt has decided to pursue surgical resection at [facility] glossectomy/mandibulectomy w/ reconstruction .   RD will pick back up after his surgery  Burtis Junes RD, LDN, CNSC Clinical Nutrition Available Tues-Sat via Pager: [phone] 01/18/2019 11:13 AM

## 2019-01-18 NOTE — Telephone Encounter (Signed)
Pt's daughter, Larene Beach, called the clinic this morning and spoke to Jene Every, Nurse Navigator.  She reported that her dad was having pain around PEG tube site (placed on 01/12/2019).  Pt is on oxycodone 10 mg tid, and was given a temporary Rx for hydrocodone 5 mg for breakthrough pain prn from Dr. Delton Coombes on 01/13/2019. Per Larene Beach, based on inbasket message from Diane, the pain around Mr. Strickling PEG tube is not being controlled with these pain medications.  I informed Dr. Delton Coombes of the above.  He will not write anything more for pain for the patient, as he states the pain should not be at PEG insertion site this far out after placement.  He recommends f/u with Dr. Constance Haw so she can assess the site and make sure there is no infection or any other abnormality at site.  VM message left on Shannon's cell phone advising of this recommendation and that we would not be calling in anything more for pain at this time.  Advised to call the clinic with any additional questions or concerns.

## 2019-01-20 ENCOUNTER — Encounter: Payer: Self-pay | Admitting: General Surgery

## 2019-01-20 ENCOUNTER — Ambulatory Visit (INDEPENDENT_AMBULATORY_CARE_PROVIDER_SITE_OTHER): Payer: Self-pay | Admitting: General Surgery

## 2019-01-20 VITALS — BP 156/94 | HR 86 | Temp 98.2°F | Resp 18 | Wt 186.4 lb

## 2019-01-20 DIAGNOSIS — C109 Malignant neoplasm of oropharynx, unspecified: Secondary | ICD-10-CM

## 2019-01-20 NOTE — Patient Instructions (Signed)
PEG tube now with bumper at 3.75 (between the dot and the 4). Keep it at this mark as your muscle is a little swollen and the bumper was a little tight. If the swelling goes down and it feels looser, you can tighten back down to 3.5 (the dot).

## 2019-01-20 NOTE — Progress Notes (Signed)
[facility] Note   HPI:  68 y.o. Male presents to clinic for post-op follow-up after port and PEG placement. He has been having pain around the PEG. They have been doing some bolus feeds without issues. He says that it is sensitive at the skin. No pain with feeds and tolerating feeds. Having BMs. Starts his treatment soon and is going to get surgery too per his report.   Review of Systems:  No fever or chills No drainage around PEG  All other review of systems: otherwise negative   Vital Signs:  BP (!) 156/94 (BP Location: Left Arm, Patient Position: Sitting, Cuff Size: Normal)   Pulse 86   Temp 98.2 F (36.8 C) (Temporal)   Resp 18   Wt 186 lb 6.4 oz (84.6 kg)   BMI 23.93 kg/m    Physical Exam:  Physical Exam Vitals signs reviewed.  HENT:     Head: Normocephalic.  Pulmonary:     Effort: Pulmonary effort is normal.  Abdominal:     General: There is no distension.     Palpations: Abdomen is soft.     Tenderness: There is abdominal tenderness.     Comments: PEG bumper at a little tighter than 3.5 mark, snug on the abdominal wall, sensitive on touching the skin in the area but not on deep palpation, no erythema extending out, some minor irritation under the bumper, bumper loosened to 3.75  Neurological:     Mental Status: He is alert.     ssessment:  68 y.o. yo Male s/p PEG placement for oropharyngeal cancer. Doing fair. The PEG was a little too tight and he was having some sensitivity related to this tightness. Have loosened it and pain is resolved. Patient feels better.   Plan:  PEG tube now with bumper at 3.75 (between the dot and the 4).  Keep it at this mark as your muscle is a little swollen and the bumper was a little tight. If the swelling goes down and it feels looser, you can tighten back down to 3.5 (the dot).   All of the above recommendations were discussed with the patient and patient's family, and all of patient's and family's questions were  answered to their expressed satisfaction.  Curlene Labrum, MD [facility] [address] [place], [place] [address] [phone] (office)

## 2019-01-25 ENCOUNTER — Ambulatory Visit: Payer: Self-pay | Admitting: General Surgery

## 2019-02-08 DIAGNOSIS — M4726 Other spondylosis with radiculopathy, lumbar region: Secondary | ICD-10-CM | POA: Diagnosis not present

## 2019-02-08 DIAGNOSIS — E0843 Diabetes mellitus due to underlying condition with diabetic autonomic (poly)neuropathy: Secondary | ICD-10-CM | POA: Diagnosis not present

## 2019-02-08 DIAGNOSIS — G8929 Other chronic pain: Secondary | ICD-10-CM | POA: Diagnosis not present

## 2019-02-08 DIAGNOSIS — M4807 Spinal stenosis, lumbosacral region: Secondary | ICD-10-CM | POA: Diagnosis not present

## 2019-02-08 DIAGNOSIS — M792 Neuralgia and neuritis, unspecified: Secondary | ICD-10-CM | POA: Diagnosis not present

## 2019-02-08 DIAGNOSIS — G894 Chronic pain syndrome: Secondary | ICD-10-CM | POA: Diagnosis not present

## 2019-02-08 DIAGNOSIS — M48062 Spinal stenosis, lumbar region with neurogenic claudication: Secondary | ICD-10-CM | POA: Diagnosis not present

## 2019-02-08 DIAGNOSIS — M47897 Other spondylosis, lumbosacral region: Secondary | ICD-10-CM | POA: Diagnosis not present

## 2019-02-14 DIAGNOSIS — C021 Malignant neoplasm of border of tongue: Secondary | ICD-10-CM | POA: Diagnosis not present

## 2019-02-14 DIAGNOSIS — C069 Malignant neoplasm of mouth, unspecified: Secondary | ICD-10-CM | POA: Diagnosis not present

## 2019-02-14 DIAGNOSIS — R911 Solitary pulmonary nodule: Secondary | ICD-10-CM | POA: Diagnosis not present

## 2019-02-22 DIAGNOSIS — E119 Type 2 diabetes mellitus without complications: Secondary | ICD-10-CM | POA: Insufficient documentation

## 2019-02-22 DIAGNOSIS — J449 Chronic obstructive pulmonary disease, unspecified: Secondary | ICD-10-CM | POA: Insufficient documentation

## 2019-02-22 DIAGNOSIS — I10 Essential (primary) hypertension: Secondary | ICD-10-CM | POA: Insufficient documentation

## 2019-02-25 DIAGNOSIS — T8149XA Infection following a procedure, other surgical site, initial encounter: Secondary | ICD-10-CM | POA: Diagnosis not present

## 2019-02-25 DIAGNOSIS — I10 Essential (primary) hypertension: Secondary | ICD-10-CM | POA: Diagnosis not present

## 2019-02-25 DIAGNOSIS — D62 Acute posthemorrhagic anemia: Secondary | ICD-10-CM | POA: Diagnosis not present

## 2019-02-25 DIAGNOSIS — F419 Anxiety disorder, unspecified: Secondary | ICD-10-CM | POA: Diagnosis not present

## 2019-02-25 DIAGNOSIS — C770 Secondary and unspecified malignant neoplasm of lymph nodes of head, face and neck: Secondary | ICD-10-CM | POA: Diagnosis not present

## 2019-02-25 DIAGNOSIS — L0211 Cutaneous abscess of neck: Secondary | ICD-10-CM | POA: Diagnosis not present

## 2019-02-25 DIAGNOSIS — E1165 Type 2 diabetes mellitus with hyperglycemia: Secondary | ICD-10-CM | POA: Diagnosis not present

## 2019-02-25 DIAGNOSIS — F17210 Nicotine dependence, cigarettes, uncomplicated: Secondary | ICD-10-CM | POA: Diagnosis present

## 2019-02-25 DIAGNOSIS — C049 Malignant neoplasm of floor of mouth, unspecified: Secondary | ICD-10-CM | POA: Diagnosis not present

## 2019-02-25 DIAGNOSIS — R252 Cramp and spasm: Secondary | ICD-10-CM | POA: Diagnosis present

## 2019-02-25 DIAGNOSIS — Z930 Tracheostomy status: Secondary | ICD-10-CM | POA: Diagnosis not present

## 2019-02-25 DIAGNOSIS — F329 Major depressive disorder, single episode, unspecified: Secondary | ICD-10-CM | POA: Diagnosis not present

## 2019-02-25 DIAGNOSIS — J449 Chronic obstructive pulmonary disease, unspecified: Secondary | ICD-10-CM | POA: Diagnosis not present

## 2019-02-25 DIAGNOSIS — E1140 Type 2 diabetes mellitus with diabetic neuropathy, unspecified: Secondary | ICD-10-CM | POA: Diagnosis present

## 2019-02-25 DIAGNOSIS — M109 Gout, unspecified: Secondary | ICD-10-CM | POA: Diagnosis present

## 2019-02-25 DIAGNOSIS — C041 Malignant neoplasm of lateral floor of mouth: Secondary | ICD-10-CM | POA: Diagnosis not present

## 2019-02-25 DIAGNOSIS — C7989 Secondary malignant neoplasm of other specified sites: Secondary | ICD-10-CM | POA: Diagnosis present

## 2019-02-25 DIAGNOSIS — E785 Hyperlipidemia, unspecified: Secondary | ICD-10-CM | POA: Diagnosis present

## 2019-02-25 DIAGNOSIS — R918 Other nonspecific abnormal finding of lung field: Secondary | ICD-10-CM | POA: Diagnosis not present

## 2019-02-25 DIAGNOSIS — C069 Malignant neoplasm of mouth, unspecified: Secondary | ICD-10-CM | POA: Diagnosis not present

## 2019-02-25 DIAGNOSIS — E1142 Type 2 diabetes mellitus with diabetic polyneuropathy: Secondary | ICD-10-CM | POA: Diagnosis not present

## 2019-02-25 DIAGNOSIS — Z794 Long term (current) use of insulin: Secondary | ICD-10-CM | POA: Diagnosis not present

## 2019-02-25 DIAGNOSIS — J690 Pneumonitis due to inhalation of food and vomit: Secondary | ICD-10-CM | POA: Diagnosis not present

## 2019-02-25 DIAGNOSIS — K219 Gastro-esophageal reflux disease without esophagitis: Secondary | ICD-10-CM | POA: Diagnosis present

## 2019-02-25 DIAGNOSIS — M1990 Unspecified osteoarthritis, unspecified site: Secondary | ICD-10-CM | POA: Diagnosis not present

## 2019-02-25 DIAGNOSIS — C7951 Secondary malignant neoplasm of bone: Secondary | ICD-10-CM | POA: Diagnosis not present

## 2019-02-25 DIAGNOSIS — E440 Moderate protein-calorie malnutrition: Secondary | ICD-10-CM | POA: Diagnosis not present

## 2019-02-25 DIAGNOSIS — Z7984 Long term (current) use of oral hypoglycemic drugs: Secondary | ICD-10-CM | POA: Diagnosis not present

## 2019-02-25 DIAGNOSIS — T380X5A Adverse effect of glucocorticoids and synthetic analogues, initial encounter: Secondary | ICD-10-CM | POA: Diagnosis not present

## 2019-02-25 DIAGNOSIS — E119 Type 2 diabetes mellitus without complications: Secondary | ICD-10-CM | POA: Diagnosis not present

## 2019-02-28 ENCOUNTER — Inpatient Hospital Stay (HOSPITAL_COMMUNITY): Payer: Medicare Other | Admitting: Hematology

## 2019-03-03 MED ORDER — ASPIRIN 325 MG PO TABS
325.00 | ORAL_TABLET | ORAL | Status: DC
Start: 2019-03-04 — End: 2019-03-03

## 2019-03-03 MED ORDER — ENOXAPARIN SODIUM 40 MG/0.4ML ~~LOC~~ SOLN
40.00 | SUBCUTANEOUS | Status: DC
Start: 2019-03-04 — End: 2019-03-03

## 2019-03-03 MED ORDER — CLINDAMYCIN HCL 150 MG PO CAPS
300.00 | ORAL_CAPSULE | ORAL | Status: DC
Start: 2019-03-03 — End: 2019-03-03

## 2019-03-03 MED ORDER — GENERIC EXTERNAL MEDICATION
Status: DC
Start: 2019-03-03 — End: 2019-03-03

## 2019-03-03 MED ORDER — TRAMADOL HCL 50 MG PO TABS
50.00 | ORAL_TABLET | ORAL | Status: DC
Start: ? — End: 2019-03-03

## 2019-03-03 MED ORDER — ALBUTEROL SULFATE (2.5 MG/3ML) 0.083% IN NEBU
2.50 | INHALATION_SOLUTION | RESPIRATORY_TRACT | Status: DC
Start: ? — End: 2019-03-03

## 2019-03-03 MED ORDER — INSULIN GLARGINE 100 UNIT/ML SOLOSTAR PEN
10.00 | PEN_INJECTOR | SUBCUTANEOUS | Status: DC
Start: 2019-03-03 — End: 2019-03-03

## 2019-03-03 MED ORDER — BACLOFEN 10 MG PO TABS
10.00 | ORAL_TABLET | ORAL | Status: DC
Start: ? — End: 2019-03-03

## 2019-03-03 MED ORDER — DOCUSATE SODIUM 100 MG PO CAPS
100.00 | ORAL_CAPSULE | ORAL | Status: DC
Start: 2019-03-03 — End: 2019-03-03

## 2019-03-03 MED ORDER — NALOXONE HCL 0.4 MG/ML IJ SOLN
.10 | INTRAMUSCULAR | Status: DC
Start: ? — End: 2019-03-03

## 2019-03-03 MED ORDER — ONDANSETRON HCL 4 MG/2ML IJ SOLN
4.00 | INTRAMUSCULAR | Status: DC
Start: ? — End: 2019-03-03

## 2019-03-03 MED ORDER — ACETAMINOPHEN 500 MG PO TABS
1000.00 | ORAL_TABLET | ORAL | Status: DC
Start: 2019-03-03 — End: 2019-03-03

## 2019-03-03 MED ORDER — INSULIN LISPRO 100 UNIT/ML ~~LOC~~ SOLN
2.00 | SUBCUTANEOUS | Status: DC
Start: 2019-03-04 — End: 2019-03-03

## 2019-03-03 MED ORDER — LACTATED RINGERS IV SOLN
INTRAVENOUS | Status: DC
Start: ? — End: 2019-03-03

## 2019-03-03 MED ORDER — POLYETHYLENE GLYCOL 3350 17 G PO PACK
17.00 | PACK | ORAL | Status: DC
Start: 2019-03-04 — End: 2019-03-03

## 2019-03-03 MED ORDER — GABAPENTIN 400 MG PO CAPS
800.00 | ORAL_CAPSULE | ORAL | Status: DC
Start: 2019-03-03 — End: 2019-03-03

## 2019-03-03 MED ORDER — DULOXETINE HCL 30 MG PO CPEP
30.00 | ORAL_CAPSULE | ORAL | Status: DC
Start: 2019-03-03 — End: 2019-03-03

## 2019-03-03 MED ORDER — BACITRACIN ZINC 500 UNIT/GM EX OINT
TOPICAL_OINTMENT | CUTANEOUS | Status: DC
Start: 2019-03-03 — End: 2019-03-03

## 2019-03-03 MED ORDER — DEXTROSE 10 % IV SOLN
125.00 | INTRAVENOUS | Status: DC
Start: ? — End: 2019-03-03

## 2019-03-03 MED ORDER — PRAVASTATIN SODIUM 10 MG PO TABS
10.00 | ORAL_TABLET | ORAL | Status: DC
Start: 2019-03-04 — End: 2019-03-03

## 2019-03-03 MED ORDER — GENERIC EXTERNAL MEDICATION
40.00 | Status: DC
Start: 2019-03-04 — End: 2019-03-03

## 2019-03-03 MED ORDER — OXYCODONE HCL 5 MG PO TABS
10.00 | ORAL_TABLET | ORAL | Status: DC
Start: ? — End: 2019-03-03

## 2019-03-03 MED ORDER — HYDROXYZINE HCL 10 MG/5ML PO SYRP
10.00 | ORAL_SOLUTION | ORAL | Status: DC
Start: ? — End: 2019-03-03

## 2019-03-16 DIAGNOSIS — R1310 Dysphagia, unspecified: Secondary | ICD-10-CM | POA: Diagnosis not present

## 2019-03-16 DIAGNOSIS — Z431 Encounter for attention to gastrostomy: Secondary | ICD-10-CM | POA: Diagnosis not present

## 2019-03-16 DIAGNOSIS — I10 Essential (primary) hypertension: Secondary | ICD-10-CM | POA: Diagnosis not present

## 2019-03-16 DIAGNOSIS — C069 Malignant neoplasm of mouth, unspecified: Secondary | ICD-10-CM | POA: Diagnosis not present

## 2019-03-16 DIAGNOSIS — E119 Type 2 diabetes mellitus without complications: Secondary | ICD-10-CM | POA: Diagnosis not present

## 2019-03-16 DIAGNOSIS — J449 Chronic obstructive pulmonary disease, unspecified: Secondary | ICD-10-CM | POA: Diagnosis not present

## 2019-03-16 DIAGNOSIS — Z483 Aftercare following surgery for neoplasm: Secondary | ICD-10-CM | POA: Diagnosis not present

## 2019-03-16 DIAGNOSIS — Z430 Encounter for attention to tracheostomy: Secondary | ICD-10-CM | POA: Diagnosis not present

## 2019-03-17 DIAGNOSIS — R1310 Dysphagia, unspecified: Secondary | ICD-10-CM | POA: Diagnosis not present

## 2019-03-17 DIAGNOSIS — Z430 Encounter for attention to tracheostomy: Secondary | ICD-10-CM | POA: Diagnosis not present

## 2019-03-17 DIAGNOSIS — J449 Chronic obstructive pulmonary disease, unspecified: Secondary | ICD-10-CM | POA: Diagnosis not present

## 2019-03-17 DIAGNOSIS — Z431 Encounter for attention to gastrostomy: Secondary | ICD-10-CM | POA: Diagnosis not present

## 2019-03-17 DIAGNOSIS — Z483 Aftercare following surgery for neoplasm: Secondary | ICD-10-CM | POA: Diagnosis not present

## 2019-03-17 DIAGNOSIS — C069 Malignant neoplasm of mouth, unspecified: Secondary | ICD-10-CM | POA: Diagnosis not present

## 2019-03-24 DIAGNOSIS — Z9889 Other specified postprocedural states: Secondary | ICD-10-CM | POA: Diagnosis not present

## 2019-03-24 DIAGNOSIS — C069 Malignant neoplasm of mouth, unspecified: Secondary | ICD-10-CM | POA: Diagnosis not present

## 2019-03-24 DIAGNOSIS — R911 Solitary pulmonary nodule: Secondary | ICD-10-CM | POA: Diagnosis not present

## 2019-03-24 DIAGNOSIS — M2578 Osteophyte, vertebrae: Secondary | ICD-10-CM | POA: Diagnosis not present

## 2019-03-24 DIAGNOSIS — R1312 Dysphagia, oropharyngeal phase: Secondary | ICD-10-CM | POA: Diagnosis not present

## 2019-03-24 DIAGNOSIS — Z87891 Personal history of nicotine dependence: Secondary | ICD-10-CM | POA: Diagnosis not present

## 2019-03-24 DIAGNOSIS — Z931 Gastrostomy status: Secondary | ICD-10-CM | POA: Diagnosis not present

## 2019-03-24 DIAGNOSIS — R633 Feeding difficulties: Secondary | ICD-10-CM | POA: Diagnosis not present

## 2019-03-24 DIAGNOSIS — G8929 Other chronic pain: Secondary | ICD-10-CM | POA: Diagnosis not present

## 2019-03-30 DIAGNOSIS — G894 Chronic pain syndrome: Secondary | ICD-10-CM | POA: Diagnosis not present

## 2019-04-05 DIAGNOSIS — C069 Malignant neoplasm of mouth, unspecified: Secondary | ICD-10-CM | POA: Diagnosis not present

## 2019-04-05 DIAGNOSIS — C048 Malignant neoplasm of overlapping sites of floor of mouth: Secondary | ICD-10-CM | POA: Diagnosis not present

## 2019-04-05 DIAGNOSIS — R911 Solitary pulmonary nodule: Secondary | ICD-10-CM | POA: Diagnosis not present

## 2019-04-05 DIAGNOSIS — R590 Localized enlarged lymph nodes: Secondary | ICD-10-CM | POA: Diagnosis not present

## 2019-04-06 DIAGNOSIS — Z483 Aftercare following surgery for neoplasm: Secondary | ICD-10-CM | POA: Diagnosis not present

## 2019-04-06 DIAGNOSIS — C069 Malignant neoplasm of mouth, unspecified: Secondary | ICD-10-CM | POA: Diagnosis not present

## 2019-04-06 DIAGNOSIS — J449 Chronic obstructive pulmonary disease, unspecified: Secondary | ICD-10-CM | POA: Diagnosis not present

## 2019-04-06 DIAGNOSIS — Z431 Encounter for attention to gastrostomy: Secondary | ICD-10-CM | POA: Diagnosis not present

## 2019-04-06 DIAGNOSIS — Z430 Encounter for attention to tracheostomy: Secondary | ICD-10-CM | POA: Diagnosis not present

## 2019-04-06 DIAGNOSIS — R1310 Dysphagia, unspecified: Secondary | ICD-10-CM | POA: Diagnosis not present

## 2019-04-08 DIAGNOSIS — C049 Malignant neoplasm of floor of mouth, unspecified: Secondary | ICD-10-CM | POA: Diagnosis not present

## 2019-04-08 DIAGNOSIS — R1312 Dysphagia, oropharyngeal phase: Secondary | ICD-10-CM | POA: Diagnosis not present

## 2019-04-08 DIAGNOSIS — Z87891 Personal history of nicotine dependence: Secondary | ICD-10-CM | POA: Diagnosis not present

## 2019-04-08 DIAGNOSIS — Z9889 Other specified postprocedural states: Secondary | ICD-10-CM | POA: Diagnosis not present

## 2019-04-08 DIAGNOSIS — Z930 Tracheostomy status: Secondary | ICD-10-CM | POA: Diagnosis not present

## 2019-04-08 DIAGNOSIS — C069 Malignant neoplasm of mouth, unspecified: Secondary | ICD-10-CM | POA: Diagnosis not present

## 2019-04-08 DIAGNOSIS — Z931 Gastrostomy status: Secondary | ICD-10-CM | POA: Diagnosis not present

## 2019-04-08 DIAGNOSIS — Z9089 Acquired absence of other organs: Secondary | ICD-10-CM | POA: Diagnosis not present

## 2019-04-08 DIAGNOSIS — R911 Solitary pulmonary nodule: Secondary | ICD-10-CM | POA: Diagnosis not present

## 2019-04-11 ENCOUNTER — Ambulatory Visit (HOSPITAL_COMMUNITY): Payer: Medicare Other | Admitting: Hematology

## 2019-04-15 DIAGNOSIS — Z483 Aftercare following surgery for neoplasm: Secondary | ICD-10-CM | POA: Diagnosis not present

## 2019-04-15 DIAGNOSIS — I10 Essential (primary) hypertension: Secondary | ICD-10-CM | POA: Diagnosis not present

## 2019-04-15 DIAGNOSIS — J449 Chronic obstructive pulmonary disease, unspecified: Secondary | ICD-10-CM | POA: Diagnosis not present

## 2019-04-15 DIAGNOSIS — C069 Malignant neoplasm of mouth, unspecified: Secondary | ICD-10-CM | POA: Diagnosis not present

## 2019-04-15 DIAGNOSIS — Z430 Encounter for attention to tracheostomy: Secondary | ICD-10-CM | POA: Diagnosis not present

## 2019-04-15 DIAGNOSIS — E119 Type 2 diabetes mellitus without complications: Secondary | ICD-10-CM | POA: Diagnosis not present

## 2019-04-15 DIAGNOSIS — Z431 Encounter for attention to gastrostomy: Secondary | ICD-10-CM | POA: Diagnosis not present

## 2019-04-15 DIAGNOSIS — R1310 Dysphagia, unspecified: Secondary | ICD-10-CM | POA: Diagnosis not present

## 2019-04-16 DIAGNOSIS — Z483 Aftercare following surgery for neoplasm: Secondary | ICD-10-CM | POA: Diagnosis not present

## 2019-04-16 DIAGNOSIS — Z431 Encounter for attention to gastrostomy: Secondary | ICD-10-CM | POA: Diagnosis not present

## 2019-04-16 DIAGNOSIS — R1310 Dysphagia, unspecified: Secondary | ICD-10-CM | POA: Diagnosis not present

## 2019-04-16 DIAGNOSIS — J449 Chronic obstructive pulmonary disease, unspecified: Secondary | ICD-10-CM | POA: Diagnosis not present

## 2019-04-16 DIAGNOSIS — C069 Malignant neoplasm of mouth, unspecified: Secondary | ICD-10-CM | POA: Diagnosis not present

## 2019-04-16 DIAGNOSIS — Z430 Encounter for attention to tracheostomy: Secondary | ICD-10-CM | POA: Diagnosis not present

## 2019-04-18 DIAGNOSIS — K9289 Other specified diseases of the digestive system: Secondary | ICD-10-CM | POA: Diagnosis not present

## 2019-04-18 DIAGNOSIS — R911 Solitary pulmonary nodule: Secondary | ICD-10-CM | POA: Diagnosis not present

## 2019-04-18 DIAGNOSIS — Z483 Aftercare following surgery for neoplasm: Secondary | ICD-10-CM | POA: Diagnosis not present

## 2019-04-18 DIAGNOSIS — Z930 Tracheostomy status: Secondary | ICD-10-CM | POA: Diagnosis not present

## 2019-04-18 DIAGNOSIS — C069 Malignant neoplasm of mouth, unspecified: Secondary | ICD-10-CM | POA: Diagnosis not present

## 2019-04-18 DIAGNOSIS — R531 Weakness: Secondary | ICD-10-CM | POA: Diagnosis not present

## 2019-04-18 DIAGNOSIS — Z87891 Personal history of nicotine dependence: Secondary | ICD-10-CM | POA: Diagnosis not present

## 2019-04-19 DIAGNOSIS — C049 Malignant neoplasm of floor of mouth, unspecified: Secondary | ICD-10-CM | POA: Diagnosis not present

## 2019-04-25 ENCOUNTER — Inpatient Hospital Stay (HOSPITAL_COMMUNITY): Payer: Medicare Other | Attending: Hematology | Admitting: Hematology

## 2019-04-25 ENCOUNTER — Encounter (HOSPITAL_COMMUNITY): Payer: Self-pay | Admitting: Hematology

## 2019-04-25 ENCOUNTER — Other Ambulatory Visit: Payer: Self-pay

## 2019-04-25 DIAGNOSIS — C049 Malignant neoplasm of floor of mouth, unspecified: Secondary | ICD-10-CM | POA: Insufficient documentation

## 2019-04-25 NOTE — Assessment & Plan Note (Addendum)
1.  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 Reynaldo Minium at [facility], pathology showing invasive squamous cell carcinoma, spindle cell carcinoma/carcinosarcoma variant, tumor measuring 2.9 cm, invades into the mandible, margins negative, no lymphovascular invasion, perineural invasion present.  Metastatic carcinoma involving 1 lymph node, largest focus 0.7 cm.  No extranodal extension. -PET CT scan on 04/05/2019 shows 1.4 cm gastrohepatic lymph node (SUV 4.5).  There is also a new 1 cm left upper lobe lung nodule with FDG uptake.  Cavitary left lung nodule is stable without hypermetabolism. - We reviewed the path report in detail.  Because of adverse features like positive perineural invasion and aggressive histology, systemic therapy with RT can be considered. -However we would like to rule out metastatic disease.  He had an appointment to see Dr. Roxan Hockey last week and missed it.  He has an appointment to see him this Thursday.  Dr. Roxan Hockey recommends watching the lung nodule, I would schedule him for EUS guided biopsy of the gastrohepatic lymph node. - I will see him back later this week.  He already has a port placed.  He would be candidate for weekly cisplatin.  2.  Nutrition: -He has a PEG tube and is taking in 4 to 6 cans of Osmolite.  He is also eating soft foods by mouth.

## 2019-04-25 NOTE — Patient Instructions (Signed)
[place] at [facility] Discharge Instructions  You were seen today by Dr. Delton Coombes. He went over how you've been since your last visit. He discussed with you the need for chemotherapy and radiation. He would like you to have a biopsy done. He will see you back after your biopsy for labs and follow up.   Thank you for choosing [place] at [facility] to provide your oncology and hematology care.  To afford each patient quality time with our provider, please arrive at least 15 minutes before your scheduled appointment time.   If you have a lab appointment with the [facility] please come in thru the  Main Entrance and check in at the main information desk  You need to re-schedule your appointment should you arrive 10 or more minutes late.  We strive to give you quality time with our providers, and arriving late affects you and other patients whose appointments are after yours.  Also, if you no show three or more times for appointments you may be dismissed from the clinic at the providers discretion.     Again, thank you for choosing [facility].  Our hope is that these requests will decrease the amount of time that you wait before being seen by our physicians.       _____________________________________________________________  Should you have questions after your visit to [facility], please contact our office at [phone] between the hours of 8:00 a.m. and 4:30 p.m.  Voicemails left after 4:00 p.m. will not be returned until the following business day.  For prescription refill requests, have your pharmacy contact our office and allow 72 hours.    Cancer Center Support Programs:   > Cancer Support Group  2nd Tuesday of the month 1pm-2pm, Journey Room

## 2019-04-25 NOTE — Progress Notes (Signed)
[place], [place] [address]   CLINIC:  Medical Oncology/Hematology  PCP:  Monico Blitz, [place] [address] [phone]   REASON FOR VISIT:  Follow-up for stage IV floor of the mouth squamous cell carcinoma.  CURRENT THERAPY: Work-up.   INTERVAL HISTORY:  Lance [name] 68 y.o. male returns for follow-up after surgery of his floor of the mouth cancer.  He underwent surgical resection on 02/25/2019 by Dr. Verlan Friends at [facility].  He reportedly had a PET CT scan on 04/05/2019.  He is able to eat soft foods by mouth.  He is also taking in 4 to 6 cans of Osmolite via PEG tube.  He had a port placed.  He was evaluated by Dr.Yanagihara.  He reportedly missed appointment with Dr. Roxan Hockey last week.  Denies any tingling or numbness next 20s.  Denies any ringing in the ears or hearing loss.  His appetite is good.  However energy levels are low.    REVIEW OF SYSTEMS:  Review of Systems  Respiratory: Positive for shortness of breath.   All other systems reviewed and are negative.    PAST MEDICAL/SURGICAL HISTORY:  Past Medical History:  Diagnosis Date  . Arthritis   . Back pain   . COPD (chronic obstructive pulmonary disease) (HCC)    mild per pt  . Depression   . Diabetes mellitus without complication ([place])   . GERD (gastroesophageal reflux disease)   . Gout   . Hypertension    Past Surgical History:  Procedure Laterality Date  . ESOPHAGOGASTRODUODENOSCOPY (EGD) WITH PROPOFOL Left 01/12/2019   Procedure: ESOPHAGOGASTRODUODENOSCOPY (EGD) WITH PROPOFOL;  Surgeon: Virl Cagey, MD;  Location: [facility];  Service: General;  Laterality: Left;  . EYE SURGERY    . FLOOR OF MOUTH BIOPSY Left 01/04/2019   Procedure: FLOOR OF MOUTH BIOPSY;  Surgeon: Leta Baptist, MD;  Location: [facility];  Service: ENT;  Laterality: Left;  . LEG SURGERY    . PEG PLACEMENT N/A 01/12/2019   Procedure: PERCUTANEOUS ENDOSCOPIC  GASTROSTOMY (PEG) PLACEMENT;  Surgeon: Virl Cagey, MD;  Location: [facility];  Service: General;  Laterality: N/A;  . PORTACATH PLACEMENT Right 01/12/2019   Procedure: INSERTION PORT-A-CATH;  Surgeon: Virl Cagey, MD;  Location: [facility];  Service: General;  Laterality: Right;     SOCIAL HISTORY:  Social History   Socioeconomic History  . Marital status: Significant Other    Spouse name: Not on file  . Number of children: Not on file  . Years of education: Not on file  . Highest education level: Not on file  Occupational History  . Not on file  Social Needs  . Financial resource strain: Not hard at all  . Food insecurity:    Worry: Never true    Inability: Never true  . Transportation needs:    Medical: No    Non-medical: No  Tobacco Use  . Smoking status: Current Every Day Smoker    Packs/day: 1.00    Types: Cigarettes  . Smokeless tobacco: Never Used  Substance and Sexual Activity  . Alcohol use: Yes    Comment: occasional   . Drug use: No  . Sexual activity: Not Currently  Lifestyle  . Physical activity:    Days per week: 0 days    Minutes per session: 0 min  . Stress: Not at all  Relationships  . Social connections:    Talks on phone:  Patient refused    Gets together: Patient refused    Attends religious service: Patient refused    Active member of club or organization: Patient refused    Attends meetings of clubs or organizations: Patient refused    Relationship status: Patient refused  . Intimate partner violence:    Fear of current or ex partner: No    Emotionally abused: No    Physically abused: No    Forced sexual activity: No  Other Topics Concern  . Not on file  Social History Narrative  . Not on file    FAMILY HISTORY:  Family History  Problem Relation Age of Onset  . Heart attack Mother   . COPD Mother   . Lung cancer Father   . Lung cancer Brother     CURRENT MEDICATIONS:  Outpatient Encounter Medications as of 04/25/2019   Medication Sig  . albuterol (PROVENTIL HFA;VENTOLIN HFA) 108 (90 Base) MCG/ACT inhaler Inhale 1 puff into the lungs every 6 (six) hours as needed for wheezing or shortness of breath.  Marland Kitchen amoxicillin-clavulanate (AUGMENTIN) 600-42.9 MG/5ML suspension dispense 7.3 ML PER G.TUBE EVERY TWELVE HOURS FOR 10 DAYS  . aspirin 325 MG tablet Take by mouth.  . baclofen (LIORESAL) 10 MG tablet Take 10 mg by mouth at bedtime.   . canagliflozin (INVOKANA) 100 MG TABS tablet Take 100 mg by mouth daily before breakfast.  . colchicine 0.6 MG tablet Take 0.6 mg by mouth 2 (two) times daily.   . DULoxetine (CYMBALTA) 30 MG capsule Take 30 mg by mouth at bedtime.   . fenofibrate 160 MG tablet Take 160 mg by mouth daily.  Marland Kitchen gabapentin (NEURONTIN) 800 MG tablet Take 800 mg by mouth 3 (three) times daily.  Marland Kitchen HYDROcodone-acetaminophen (NORCO) 5-325 MG tablet Take 1 tablet by mouth every 6 (six) hours as needed for moderate pain.  Marland Kitchen LANTUS SOLOSTAR 100 UNIT/ML Solostar Pen   . lisinopril (PRINIVIL,ZESTRIL) 10 MG tablet Take 10 mg by mouth daily.  . metFORMIN (GLUCOPHAGE) 1000 MG tablet Take 1,000 mg by mouth 2 (two) times daily with a meal.  . naloxone (NARCAN) 2 MG/2ML injection For suspected opioid overdose, spray 1 mL in each nostril.  Repeat after 3 minutes if no or minimal response.  Marland Kitchen omeprazole (PRILOSEC) 20 MG capsule Take 20 mg by mouth daily.  . Oxycodone HCl 10 MG TABS Take 10 mg by mouth 3 (three) times daily.   . pantoprazole (PROTONIX) 40 MG tablet   . pravastatin (PRAVACHOL) 10 MG tablet Take 10 mg by mouth daily with supper.   . [DISCONTINUED] omeprazole (PRILOSEC OTC) 20 MG tablet 20 mg.  . traMADol (ULTRAM) 50 MG tablet TAKE 1 TABLET BY MOUTH EVERY 6 HOURS AS NEEDED FOR UP TO SEVEN DAYS  . [DISCONTINUED] amoxicillin-clavulanate (AUGMENTIN) 600-42.9 MG/5ML suspension 7.3 mL every twelve (12) hours.  . [DISCONTINUED] amoxicillin-clavulanate (AUGMENTIN) 875-125 MG tablet   . [DISCONTINUED] aspirin EC 325  MG tablet    No facility-administered encounter medications on file as of 04/25/2019.     ALLERGIES:  No Known Allergies   PHYSICAL EXAM:  ECOG Performance status: 1  Vitals:   04/25/19 1530  BP: (!) 154/85  Pulse: 80  Resp: 20  Temp: 98.1 F (36.7 C)  SpO2: 100%   Filed Weights   04/25/19 1530  Weight: 158 lb 8 oz (71.9 kg)    Physical Exam Vitals signs reviewed.  Constitutional:      Appearance: Normal appearance.  HENT:  Mouth/Throat:     Mouth: Mucous membranes are moist.  Neck:     Musculoskeletal: No muscular tenderness.  Cardiovascular:     Rate and Rhythm: Normal rate and regular rhythm.     Heart sounds: Normal heart sounds.  Pulmonary:     Effort: Pulmonary effort is normal.     Breath sounds: Normal breath sounds.  Abdominal:     General: There is no distension.     Palpations: Abdomen is soft. There is no mass.  Musculoskeletal:        General: No swelling.  Lymphadenopathy:     Cervical: No cervical adenopathy.  Skin:    General: Skin is warm.  Neurological:     General: No focal deficit present.     Mental Status: He is alert and oriented to person, place, and time.  Psychiatric:        Mood and Affect: Mood normal.        Behavior: Behavior normal.      LABORATORY DATA:  I have reviewed the labs as listed.  CBC    Component Value Date/Time   WBC 6.6 01/10/2019 1019   RBC 4.85 01/10/2019 1019   HGB 14.9 01/10/2019 1019   HCT 46.9 01/10/2019 1019   PLT 220 01/10/2019 1019   MCV 96.7 01/10/2019 1019   MCH 30.7 01/10/2019 1019   MCHC 31.8 01/10/2019 1019   RDW 13.2 01/10/2019 1019   LYMPHSABS 1.1 01/10/2019 1019   MONOABS 0.6 01/10/2019 1019   EOSABS 0.2 01/10/2019 1019   BASOSABS 0.0 01/10/2019 1019   CMP Latest Ref Rng & Units 01/04/2019 12/15/2017 06/03/2017  Glucose 70 - 99 mg/dL 159(H) - 151(H)  BUN 8 - 23 mg/dL 9 - 7  Creatinine 0.61 - 1.24 mg/dL 0.70 0.80 0.84  Sodium 135 - 145 mmol/L 138 - 135  Potassium 3.5 - 5.1  mmol/L 3.9 - 4.0  Chloride 98 - 111 mmol/L 107 - 100(L)  CO2 22 - 32 mmol/L 23 - 26  Calcium 8.9 - 10.3 mg/dL 9.5 - 9.3  Total Protein 6.5 - 8.1 g/dL - - 7.2  Total Bilirubin 0.3 - 1.2 mg/dL - - 0.4  Alkaline Phos 38 - 126 U/L - - 64  AST 15 - 41 U/L - - 15  ALT 17 - 63 U/L - - 15(L)       DIAGNOSTIC IMAGING:  I have independently reviewed the scans and discussed with the patient.   I have reviewed Lance Finders, NP's note and agree with the documentation.  I personally performed a face-to-face visit, made revisions and my assessment and plan is as follows.    ASSESSMENT & PLAN:   Floor of mouth squamous cell carcinoma (HCC) 1.  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 Reynaldo Minium at [facility], pathology showing invasive squamous cell carcinoma, spindle cell carcinoma/carcinosarcoma variant, tumor measuring 2.9 cm, invades into the mandible, margins negative, no lymphovascular invasion, perineural invasion present.  Metastatic carcinoma involving 1 lymph node, largest focus 0.7 cm.  No extranodal extension. -PET CT scan on 04/05/2019 shows 1.4 cm gastrohepatic lymph node (SUV 4.5).  There is also a new 1 cm left upper lobe lung nodule with FDG uptake.  Cavitary left lung nodule is stable without hypermetabolism. - We reviewed the path report in detail.  Because of adverse features like positive perineural invasion, systemic therapy with RT can be considered. -However we would like  to rule out metastatic disease.  He had an appointment to see Dr. Roxan Hockey last week and missed it.  He has an appointment to see him this Thursday.  Dr. Roxan Hockey recommends watching the lung nodule, I would schedule him for EUS guided biopsy of the gastrohepatic lymph node. - I will see him back later this week.  He already has a port placed.  He would be candidate for weekly cisplatin.  2.  Nutrition: -He has a PEG tube and is  taking in 4 to 6 cans of Osmolite.  He is also eating soft foods by mouth.  Total time spent is 40 minutes with more than 50% of time spent face-to-face discussing PET scan reports, further work-up and treatment plan, and coordination of care.    Orders placed this encounter:  No orders of the defined types were placed in this encounter.     Derek Jack, MD [place] [phone]

## 2019-04-26 ENCOUNTER — Encounter: Payer: Medicare Other | Admitting: Thoracic Surgery (Cardiothoracic Vascular Surgery)

## 2019-04-26 DIAGNOSIS — G894 Chronic pain syndrome: Secondary | ICD-10-CM | POA: Diagnosis not present

## 2019-04-27 DIAGNOSIS — C069 Malignant neoplasm of mouth, unspecified: Secondary | ICD-10-CM | POA: Diagnosis not present

## 2019-04-27 DIAGNOSIS — Z431 Encounter for attention to gastrostomy: Secondary | ICD-10-CM | POA: Diagnosis not present

## 2019-04-27 DIAGNOSIS — Z430 Encounter for attention to tracheostomy: Secondary | ICD-10-CM | POA: Diagnosis not present

## 2019-04-27 DIAGNOSIS — J449 Chronic obstructive pulmonary disease, unspecified: Secondary | ICD-10-CM | POA: Diagnosis not present

## 2019-04-27 DIAGNOSIS — R1310 Dysphagia, unspecified: Secondary | ICD-10-CM | POA: Diagnosis not present

## 2019-04-27 DIAGNOSIS — Z483 Aftercare following surgery for neoplasm: Secondary | ICD-10-CM | POA: Diagnosis not present

## 2019-04-28 ENCOUNTER — Encounter: Payer: Medicare Other | Admitting: Thoracic Surgery (Cardiothoracic Vascular Surgery)

## 2019-04-28 ENCOUNTER — Other Ambulatory Visit: Payer: Self-pay | Admitting: Thoracic Surgery (Cardiothoracic Vascular Surgery)

## 2019-04-28 DIAGNOSIS — Z431 Encounter for attention to gastrostomy: Secondary | ICD-10-CM | POA: Diagnosis not present

## 2019-04-28 DIAGNOSIS — R911 Solitary pulmonary nodule: Secondary | ICD-10-CM

## 2019-04-28 DIAGNOSIS — Z483 Aftercare following surgery for neoplasm: Secondary | ICD-10-CM | POA: Diagnosis not present

## 2019-04-28 DIAGNOSIS — R1310 Dysphagia, unspecified: Secondary | ICD-10-CM | POA: Diagnosis not present

## 2019-04-28 DIAGNOSIS — J449 Chronic obstructive pulmonary disease, unspecified: Secondary | ICD-10-CM | POA: Diagnosis not present

## 2019-04-28 DIAGNOSIS — C069 Malignant neoplasm of mouth, unspecified: Secondary | ICD-10-CM | POA: Diagnosis not present

## 2019-04-28 DIAGNOSIS — Z430 Encounter for attention to tracheostomy: Secondary | ICD-10-CM | POA: Diagnosis not present

## 2019-04-29 ENCOUNTER — Ambulatory Visit (HOSPITAL_COMMUNITY): Payer: Medicare Other | Admitting: Hematology

## 2019-05-03 ENCOUNTER — Ambulatory Visit (HOSPITAL_COMMUNITY)
Admission: RE | Admit: 2019-05-03 | Discharge: 2019-05-03 | Disposition: A | Discharge status: Home / Self Care | Payer: Medicare Other | Source: Ambulatory Visit | Attending: Thoracic Surgery (Cardiothoracic Vascular Surgery) | Admitting: Thoracic Surgery (Cardiothoracic Vascular Surgery)

## 2019-05-03 ENCOUNTER — Other Ambulatory Visit: Payer: Self-pay

## 2019-05-03 DIAGNOSIS — R911 Solitary pulmonary nodule: Secondary | ICD-10-CM | POA: Diagnosis not present

## 2019-05-03 DIAGNOSIS — I700 Atherosclerosis of aorta: Secondary | ICD-10-CM | POA: Diagnosis not present

## 2019-05-11 ENCOUNTER — Other Ambulatory Visit: Payer: Self-pay

## 2019-05-11 ENCOUNTER — Encounter: Payer: Self-pay | Admitting: Thoracic Surgery (Cardiothoracic Vascular Surgery)

## 2019-05-11 ENCOUNTER — Other Ambulatory Visit: Payer: Self-pay | Admitting: *Deleted

## 2019-05-11 ENCOUNTER — Institutional Professional Consult (permissible substitution) (INDEPENDENT_AMBULATORY_CARE_PROVIDER_SITE_OTHER): Payer: Medicare Other | Admitting: Thoracic Surgery (Cardiothoracic Vascular Surgery)

## 2019-05-11 VITALS — BP 157/83 | HR 87 | Resp 16 | Ht 74.0 in | Wt 158.0 lb

## 2019-05-11 DIAGNOSIS — Z85819 Personal history of malignant neoplasm of unspecified site of lip, oral cavity, and pharynx: Secondary | ICD-10-CM | POA: Diagnosis not present

## 2019-05-11 DIAGNOSIS — F32A Depression, unspecified: Secondary | ICD-10-CM | POA: Insufficient documentation

## 2019-05-11 DIAGNOSIS — R1312 Dysphagia, oropharyngeal phase: Secondary | ICD-10-CM | POA: Insufficient documentation

## 2019-05-11 DIAGNOSIS — R911 Solitary pulmonary nodule: Secondary | ICD-10-CM

## 2019-05-11 DIAGNOSIS — M1990 Unspecified osteoarthritis, unspecified site: Secondary | ICD-10-CM | POA: Insufficient documentation

## 2019-05-11 DIAGNOSIS — F329 Major depressive disorder, single episode, unspecified: Secondary | ICD-10-CM | POA: Insufficient documentation

## 2019-05-11 NOTE — H&P (View-Only) (Signed)
PCP is Monico Blitz, MD Referring Provider is Derek Jack, MD  Chief Complaint  Patient presents with  . 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  He was diagnosed with a squamous cell carcinoma of the floor the mouth earlier this year after presenting with pain.  During his preoperative work-up he was seen to have a cavitary left upper lobe lesion with an adjacent 8 mm solid nodule.  Those lesions did not show significant hypermetabolic activity on PET CT.  He underwent a floor of mouth resection including a portion of the mandible with scapular flap reconstruction and tracheostomy on 02/25/2019.  He had another PET/CT in April at [facility].  It showed minimal uptake in the cavitary nodule but there was some uptake in the satellite nodule which now measures 1 cm.  There also was an FDG avid gastrohepatic lymph node.  He saw Dr. Delton Coombes who feels that a biopsy is warranted to determine stage of lesion.  He is able to swallow now.  He has been working with speech therapy.  He is not aware of any aspiration.  He has not been having significant coughing or shortness of breath.  He did have severe weight loss with 33 pounds over the past 6 months and 8 pounds over the past 3 months.  He smoked 2 to 3 packs a day beginning at age 80.  He quit in March 2020.  Past Medical History:  Diagnosis Date  . Arthritis   . Back pain   . COPD (chronic obstructive pulmonary disease) (HCC)    mild per pt  . Depression   . Diabetes mellitus without complication ([place])   . GERD (gastroesophageal reflux disease)   . Gout   . Hypertension     Past Surgical History:   Procedure Laterality Date  . ESOPHAGOGASTRODUODENOSCOPY (EGD) WITH PROPOFOL Left 01/12/2019   Procedure: ESOPHAGOGASTRODUODENOSCOPY (EGD) WITH PROPOFOL;  Surgeon: Virl Cagey, MD;  Location: [facility];  Service: General;  Laterality: Left;  . EYE SURGERY    . FLOOR OF MOUTH BIOPSY Left 01/04/2019   Procedure: FLOOR OF MOUTH BIOPSY;  Surgeon: Leta Baptist, MD;  Location: [place];  Service: ENT;  Laterality: Left;  . LEG SURGERY    . PEG PLACEMENT N/A 01/12/2019   Procedure: PERCUTANEOUS ENDOSCOPIC GASTROSTOMY (PEG) PLACEMENT;  Surgeon: Virl Cagey, MD;  Location: [facility];  Service: General;  Laterality: N/A;  . PORTACATH PLACEMENT Right 01/12/2019   Procedure: INSERTION PORT-A-CATH;  Surgeon: Virl Cagey, MD;  Location: [facility];  Service: General;  Laterality: Right;    Family History  Problem Relation Age of Onset  . Heart attack Mother   . COPD Mother   . Lung cancer Father   . Lung cancer Brother     Social History Social History   Tobacco Use  . Smoking status: Former Smoker    Packs/day: 1.00    Types: Cigarettes    Last attempt to quit: 02/25/2019    Years since quitting: 0.2  . Smokeless tobacco: Never Used  Substance Use Topics  . Alcohol use: Yes    Comment: occasional   .  Drug use: No    Current Outpatient Medications  Medication Sig Dispense Refill  . albuterol (PROVENTIL HFA;VENTOLIN HFA) 108 (90 Base) MCG/ACT inhaler Inhale 1 puff into the lungs every 6 (six) hours as needed for wheezing or shortness of breath.    Marland Kitchen aspirin 325 MG tablet Take by mouth.    . baclofen (LIORESAL) 10 MG tablet Take 10 mg by mouth at bedtime.     . canagliflozin (INVOKANA) 100 MG TABS tablet Take 100 mg by mouth daily before breakfast.    . colchicine 0.6 MG tablet Take 0.6 mg by mouth 2 (two) times daily.     . DULoxetine (CYMBALTA) 30 MG capsule Take 30 mg by mouth at bedtime.     . fenofibrate 160 MG tablet Take 160 mg by mouth daily.    Marland Kitchen gabapentin  (NEURONTIN) 800 MG tablet Take 800 mg by mouth 3 (three) times daily.    Marland Kitchen LANTUS SOLOSTAR 100 UNIT/ML Solostar Pen     . lisinopril (PRINIVIL,ZESTRIL) 10 MG tablet Take 10 mg by mouth daily.    . metFORMIN (GLUCOPHAGE) 1000 MG tablet Take 1,000 mg by mouth 2 (two) times daily with a meal.    . naloxone (NARCAN) 2 MG/2ML injection For suspected opioid overdose, spray 1 mL in each nostril.  Repeat after 3 minutes if no or minimal response.    Marland Kitchen omeprazole (PRILOSEC) 20 MG capsule Take 20 mg by mouth daily.    . Oxycodone HCl 10 MG TABS Take 10 mg by mouth 3 (three) times daily.     . pantoprazole (PROTONIX) 40 MG tablet     . pravastatin (PRAVACHOL) 10 MG tablet Take 10 mg by mouth daily with supper.      No current facility-administered medications for this visit.     No Known Allergies  Review of Systems  Constitutional: Positive for activity change and unexpected weight change.  HENT: Positive for trouble swallowing and voice change.   Respiratory: Negative for cough and shortness of breath.   Cardiovascular: Negative for chest pain.  Gastrointestinal: Negative for abdominal distention and abdominal pain.  Genitourinary: Negative for difficulty urinating and dysuria.  Musculoskeletal: Positive for arthralgias and joint swelling.    BP (!) 157/83 (BP Location: Right Arm, Patient Position: Sitting, Cuff Size: Normal)   Pulse 87   Resp 16   Ht 6\' 2"  (1.88 m)   Wt 158 lb (71.7 kg)   SpO2 96% Comment: RA  BMI 20.29 kg/m  Physical Exam Vitals signs reviewed.  Constitutional:      General: He is not in acute distress. HENT:     Head:     Comments: Postoperative deformity, wearing surgical mask Eyes:     General: No scleral icterus.    Extraocular Movements: Extraocular movements intact.  Neck:     Musculoskeletal: Neck supple. No neck rigidity.     Comments: Tracheostomy site healed Cardiovascular:     Rate and Rhythm: Normal rate and regular rhythm.     Heart sounds:  Normal heart sounds. No murmur. No friction rub. No gallop.   Pulmonary:     Effort: Pulmonary effort is normal. No respiratory distress.     Breath sounds: Normal breath sounds. No wheezing or rales.  Abdominal:     General: There is no distension.     Palpations: Abdomen is soft.     Tenderness: There is no abdominal tenderness.  Musculoskeletal:        General: No swelling.  Lymphadenopathy:  Cervical: No cervical adenopathy.  Skin:    General: Skin is warm and dry.  Neurological:     General: No focal deficit present.     Mental Status: He is alert.     Cranial Nerves: No cranial nerve deficit.     Motor: No weakness.     Gait: Gait normal.  Psychiatric:        Mood and Affect: Mood normal.    Diagnostic Tests: CT CHEST WITHOUT CONTRAST  TECHNIQUE: Multidetector CT imaging of the chest was performed following the standard protocol without IV contrast.  COMPARISON:  PET-CT 01/10/2019.  FINDINGS: Cardiovascular: Heart size is normal. There is no significant pericardial fluid, thickening or pericardial calcification. There is aortic atherosclerosis, as well as atherosclerosis of the great vessels of the mediastinum and the coronary arteries, including calcified atherosclerotic plaque in the left main, left anterior descending, left circumflex and right coronary arteries. Right subclavian single-lumen porta cath with tip terminating in the distal superior vena cava.  Mediastinum/Nodes: No pathologically enlarged mediastinal or hilar lymph nodes. Esophagus is unremarkable in appearance. No axillary lymphadenopathy.  Lungs/Pleura: Previously noted thick-walled cavitary lesion in the left upper lobe appears essentially stable in size measuring 2.8 x 1.8 cm (axial image 76 of series 3), previously 2.1 x 2.7 cm on PET-CT 01/10/2019. Slightly cephalad and anterior to this is a smaller more solid-appearing nodule which currently measures 9 x 17 mm (axial image 69  of series 3), previously 8 x 14 mm. 7 x 14 mm ground-glass attenuation nodule in the left lower lobe (axial image 146 of series 3), not confidently identified on the prior examination. No other larger more suspicious appearing pulmonary nodules or masses are noted. Mild diffuse bronchial wall thickening with mild centrilobular and paraseptal emphysema. No acute consolidative airspace disease. No pleural effusions.  Upper Abdomen: Low-attenuation adrenal nodules bilaterally, compatible with benign adenomas, measuring up to 3.3 x 2.0 cm on the right and 3.0 x 1.8 cm on the left, similar to the prior examination. Aortic atherosclerosis. Percutaneous gastrostomy tube incompletely imaged.  Musculoskeletal: Orthopedic fixation hardware in the lower cervical spine. There are no aggressive appearing lytic or blastic lesions noted in the visualized portions of the skeleton.  IMPRESSION: 1. Previously noted left upper lobe nodules appear similar in size and morphology to the prior examination 01/30/2019. 2. New 7 x 14 mm ground-glass attenuation nodule in the left lower lobe. Attention on routine follow-up studies is recommended. 3. Aortic atherosclerosis, in addition to left main and 3 vessel coronary artery disease. Please note that although the presence of coronary artery calcium documents the presence of coronary artery disease, the severity of this disease and any potential stenosis cannot be assessed on this non-gated CT examination. Assessment for potential risk factor modification, dietary therapy or pharmacologic therapy may be warranted, if clinically indicated. 4. Mild diffuse bronchial wall thickening with mild centrilobular and paraseptal emphysema; imaging findings suggestive of underlying COPD. 5. Bilateral adrenal adenomas again noted. 6. Additional incidental findings, as above.  Aortic Atherosclerosis (ICD10-I70.0) and Emphysema (ICD10-J43.9).   Electronically  Signed   By: Vinnie Langton M.D.   On: 05/03/2019 17:32 I have personally reviewed the CT images as well as the PET/CT report from [facility].  I concur with the findings noted above.  Impression: Lance [name] is a 68 year old gentleman with a history of tobacco abuse and COPD.  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  He had a tracheostomy has been decannulated and that site has healed.   Squamous cell carcinoma floor of mouth-has been treated surgically.  Scheduled to receive adjuvant radiation.  Needs completion of staging work-up to help determine adjuvant chemotherapy.  Left upper lobe nodules-there is a cavitary nodule in the left upper lobe with an adjacent smaller solid nodule.  This could be infectious or inflammatory.  I think there is a good possibility this is due to aspiration.  There is some metabolic activity in the more adjacent solid nodule.  There is a good airway leading to this on CT, I do think this is approachable by navigational bronchoscopy.  I do not think resection is appropriate given the suspicious gastrohepatic ligament lymph node.  Hypermetabolic gastrohepatic lymph node-we will defer to Dr. Delton Coombes whether to pursue work-up.  Tobacco abuse-approximately 100-pack-year history, quit prior to his surgery in March.  I recommended to Lance [name] that we do a navigational bronchoscopy for biopsy of the left upper lobe nodule.  I described the general nature of the procedure to him, including the need for general anesthesia.  He understands that this is diagnostic and not therapeutic.  He understands there is no guarantee of a definitive diagnosis.  I informed him of the indications, risk, benefits, and alternatives.  He understands the risks include, but are not limited to death, MI, DVT, PE, bleeding, pneumothorax, loss of  airway, failure to make a diagnosis, as well as the possibility of other reversible complications.  He accepts the risks and wishes to proceed  Plan: Electromagnetic navigational bronchoscopy on 05/16/2019  Melrose Nakayama, MD [facility] [phone]

## 2019-05-11 NOTE — Progress Notes (Signed)
PCP is Monico Blitz, MD Referring Provider is Derek Jack, MD  Chief Complaint  Patient presents with  . 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  He was diagnosed with a squamous cell carcinoma of the floor the mouth earlier this year after presenting with pain.  During his preoperative work-up he was seen to have a cavitary left upper lobe lesion with an adjacent 8 mm solid nodule.  Those lesions did not show significant hypermetabolic activity on PET CT.  He underwent a floor of mouth resection including a portion of the mandible with scapular flap reconstruction and tracheostomy on 02/25/2019.  He had another PET/CT in April at [facility].  It showed minimal uptake in the cavitary nodule but there was some uptake in the satellite nodule which now measures 1 cm.  There also was an FDG avid gastrohepatic lymph node.  He saw Dr. Delton Coombes who feels that a biopsy is warranted to determine stage of lesion.  He is able to swallow now.  He has been working with speech therapy.  He is not aware of any aspiration.  He has not been having significant coughing or shortness of breath.  He did have severe weight loss with 33 pounds over the past 6 months and 8 pounds over the past 3 months.  He smoked 2 to 3 packs a day beginning at age 78.  He quit in March 2020.  Past Medical History:  Diagnosis Date  . Arthritis   . Back pain   . COPD (chronic obstructive pulmonary disease) (HCC)    mild per pt  . Depression   . Diabetes mellitus without complication ([place])   . GERD (gastroesophageal reflux disease)   . Gout   . Hypertension     Past Surgical History:   Procedure Laterality Date  . ESOPHAGOGASTRODUODENOSCOPY (EGD) WITH PROPOFOL Left 01/12/2019   Procedure: ESOPHAGOGASTRODUODENOSCOPY (EGD) WITH PROPOFOL;  Surgeon: Virl Cagey, MD;  Location: [facility];  Service: General;  Laterality: Left;  . EYE SURGERY    . FLOOR OF MOUTH BIOPSY Left 01/04/2019   Procedure: FLOOR OF MOUTH BIOPSY;  Surgeon: Leta Baptist, MD;  Location: [place];  Service: ENT;  Laterality: Left;  . LEG SURGERY    . PEG PLACEMENT N/A 01/12/2019   Procedure: PERCUTANEOUS ENDOSCOPIC GASTROSTOMY (PEG) PLACEMENT;  Surgeon: Virl Cagey, MD;  Location: [facility];  Service: General;  Laterality: N/A;  . PORTACATH PLACEMENT Right 01/12/2019   Procedure: INSERTION PORT-A-CATH;  Surgeon: Virl Cagey, MD;  Location: [facility];  Service: General;  Laterality: Right;    Family History  Problem Relation Age of Onset  . Heart attack Mother   . COPD Mother   . Lung cancer Father   . Lung cancer Brother     Social History Social History   Tobacco Use  . Smoking status: Former Smoker    Packs/day: 1.00    Types: Cigarettes    Last attempt to quit: 02/25/2019    Years since quitting: 0.2  . Smokeless tobacco: Never Used  Substance Use Topics  . Alcohol use: Yes    Comment: occasional   .  Drug use: No    Current Outpatient Medications  Medication Sig Dispense Refill  . albuterol (PROVENTIL HFA;VENTOLIN HFA) 108 (90 Base) MCG/ACT inhaler Inhale 1 puff into the lungs every 6 (six) hours as needed for wheezing or shortness of breath.    Marland Kitchen aspirin 325 MG tablet Take by mouth.    . baclofen (LIORESAL) 10 MG tablet Take 10 mg by mouth at bedtime.     . canagliflozin (INVOKANA) 100 MG TABS tablet Take 100 mg by mouth daily before breakfast.    . colchicine 0.6 MG tablet Take 0.6 mg by mouth 2 (two) times daily.     . DULoxetine (CYMBALTA) 30 MG capsule Take 30 mg by mouth at bedtime.     . fenofibrate 160 MG tablet Take 160 mg by mouth daily.    Marland Kitchen gabapentin  (NEURONTIN) 800 MG tablet Take 800 mg by mouth 3 (three) times daily.    Marland Kitchen LANTUS SOLOSTAR 100 UNIT/ML Solostar Pen     . lisinopril (PRINIVIL,ZESTRIL) 10 MG tablet Take 10 mg by mouth daily.    . metFORMIN (GLUCOPHAGE) 1000 MG tablet Take 1,000 mg by mouth 2 (two) times daily with a meal.    . naloxone (NARCAN) 2 MG/2ML injection For suspected opioid overdose, spray 1 mL in each nostril.  Repeat after 3 minutes if no or minimal response.    Marland Kitchen omeprazole (PRILOSEC) 20 MG capsule Take 20 mg by mouth daily.    . Oxycodone HCl 10 MG TABS Take 10 mg by mouth 3 (three) times daily.     . pantoprazole (PROTONIX) 40 MG tablet     . pravastatin (PRAVACHOL) 10 MG tablet Take 10 mg by mouth daily with supper.      No current facility-administered medications for this visit.     No Known Allergies  Review of Systems  Constitutional: Positive for activity change and unexpected weight change.  HENT: Positive for trouble swallowing and voice change.   Respiratory: Negative for cough and shortness of breath.   Cardiovascular: Negative for chest pain.  Gastrointestinal: Negative for abdominal distention and abdominal pain.  Genitourinary: Negative for difficulty urinating and dysuria.  Musculoskeletal: Positive for arthralgias and joint swelling.    BP (!) 157/83 (BP Location: Right Arm, Patient Position: Sitting, Cuff Size: Normal)   Pulse 87   Resp 16   Ht 6\' 2"  (1.88 m)   Wt 158 lb (71.7 kg)   SpO2 96% Comment: RA  BMI 20.29 kg/m  Physical Exam Vitals signs reviewed.  Constitutional:      General: He is not in acute distress. HENT:     Head:     Comments: Postoperative deformity, wearing surgical mask Eyes:     General: No scleral icterus.    Extraocular Movements: Extraocular movements intact.  Neck:     Musculoskeletal: Neck supple. No neck rigidity.     Comments: Tracheostomy site healed Cardiovascular:     Rate and Rhythm: Normal rate and regular rhythm.     Heart sounds:  Normal heart sounds. No murmur. No friction rub. No gallop.   Pulmonary:     Effort: Pulmonary effort is normal. No respiratory distress.     Breath sounds: Normal breath sounds. No wheezing or rales.  Abdominal:     General: There is no distension.     Palpations: Abdomen is soft.     Tenderness: There is no abdominal tenderness.  Musculoskeletal:        General: No swelling.  Lymphadenopathy:  Cervical: No cervical adenopathy.  Skin:    General: Skin is warm and dry.  Neurological:     General: No focal deficit present.     Mental Status: He is alert.     Cranial Nerves: No cranial nerve deficit.     Motor: No weakness.     Gait: Gait normal.  Psychiatric:        Mood and Affect: Mood normal.    Diagnostic Tests: CT CHEST WITHOUT CONTRAST  TECHNIQUE: Multidetector CT imaging of the chest was performed following the standard protocol without IV contrast.  COMPARISON:  PET-CT 01/10/2019.  FINDINGS: Cardiovascular: Heart size is normal. There is no significant pericardial fluid, thickening or pericardial calcification. There is aortic atherosclerosis, as well as atherosclerosis of the great vessels of the mediastinum and the coronary arteries, including calcified atherosclerotic plaque in the left main, left anterior descending, left circumflex and right coronary arteries. Right subclavian single-lumen porta cath with tip terminating in the distal superior vena cava.  Mediastinum/Nodes: No pathologically enlarged mediastinal or hilar lymph nodes. Esophagus is unremarkable in appearance. No axillary lymphadenopathy.  Lungs/Pleura: Previously noted thick-walled cavitary lesion in the left upper lobe appears essentially stable in size measuring 2.8 x 1.8 cm (axial image 76 of series 3), previously 2.1 x 2.7 cm on PET-CT 01/10/2019. Slightly cephalad and anterior to this is a smaller more solid-appearing nodule which currently measures 9 x 17 mm (axial image 69  of series 3), previously 8 x 14 mm. 7 x 14 mm ground-glass attenuation nodule in the left lower lobe (axial image 146 of series 3), not confidently identified on the prior examination. No other larger more suspicious appearing pulmonary nodules or masses are noted. Mild diffuse bronchial wall thickening with mild centrilobular and paraseptal emphysema. No acute consolidative airspace disease. No pleural effusions.  Upper Abdomen: Low-attenuation adrenal nodules bilaterally, compatible with benign adenomas, measuring up to 3.3 x 2.0 cm on the right and 3.0 x 1.8 cm on the left, similar to the prior examination. Aortic atherosclerosis. Percutaneous gastrostomy tube incompletely imaged.  Musculoskeletal: Orthopedic fixation hardware in the lower cervical spine. There are no aggressive appearing lytic or blastic lesions noted in the visualized portions of the skeleton.  IMPRESSION: 1. Previously noted left upper lobe nodules appear similar in size and morphology to the prior examination 01/30/2019. 2. New 7 x 14 mm ground-glass attenuation nodule in the left lower lobe. Attention on routine follow-up studies is recommended. 3. Aortic atherosclerosis, in addition to left main and 3 vessel coronary artery disease. Please note that although the presence of coronary artery calcium documents the presence of coronary artery disease, the severity of this disease and any potential stenosis cannot be assessed on this non-gated CT examination. Assessment for potential risk factor modification, dietary therapy or pharmacologic therapy may be warranted, if clinically indicated. 4. Mild diffuse bronchial wall thickening with mild centrilobular and paraseptal emphysema; imaging findings suggestive of underlying COPD. 5. Bilateral adrenal adenomas again noted. 6. Additional incidental findings, as above.  Aortic Atherosclerosis (ICD10-I70.0) and Emphysema (ICD10-J43.9).   Electronically  Signed   By: Vinnie Langton M.D.   On: 05/03/2019 17:32 I have personally reviewed the CT images as well as the PET/CT report from [facility].  I concur with the findings noted above.  Impression: Lance [name] is a 68 year old gentleman with a history of tobacco abuse and COPD.  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  He had a tracheostomy has been decannulated and that site has healed.   Squamous cell carcinoma floor of mouth-has been treated surgically.  Scheduled to receive adjuvant radiation.  Needs completion of staging work-up to help determine adjuvant chemotherapy.  Left upper lobe nodules-there is a cavitary nodule in the left upper lobe with an adjacent smaller solid nodule.  This could be infectious or inflammatory.  I think there is a good possibility this is due to aspiration.  There is some metabolic activity in the more adjacent solid nodule.  There is a good airway leading to this on CT, I do think this is approachable by navigational bronchoscopy.  I do not think resection is appropriate given the suspicious gastrohepatic ligament lymph node.  Hypermetabolic gastrohepatic lymph node-we will defer to Dr. Delton Coombes whether to pursue work-up.  Tobacco abuse-approximately 100-pack-year history, quit prior to his surgery in March.  I recommended to Lance [name] that we do a navigational bronchoscopy for biopsy of the left upper lobe nodule.  I described the general nature of the procedure to him, including the need for general anesthesia.  He understands that this is diagnostic and not therapeutic.  He understands there is no guarantee of a definitive diagnosis.  I informed him of the indications, risk, benefits, and alternatives.  He understands the risks include, but are not limited to death, MI, DVT, PE, bleeding, pneumothorax, loss of  airway, failure to make a diagnosis, as well as the possibility of other reversible complications.  He accepts the risks and wishes to proceed  Plan: Electromagnetic navigational bronchoscopy on 05/16/2019  Melrose Nakayama, MD [facility] [phone]

## 2019-05-12 ENCOUNTER — Other Ambulatory Visit: Payer: Self-pay

## 2019-05-12 ENCOUNTER — Telehealth: Payer: Self-pay | Admitting: Gastroenterology

## 2019-05-12 ENCOUNTER — Encounter (HOSPITAL_COMMUNITY): Payer: Self-pay | Admitting: Hematology

## 2019-05-12 ENCOUNTER — Encounter (HOSPITAL_COMMUNITY): Payer: Self-pay

## 2019-05-12 ENCOUNTER — Other Ambulatory Visit (HOSPITAL_COMMUNITY)
Admission: RE | Admit: 2019-05-12 | Discharge: 2019-05-12 | Disposition: A | Discharge status: Home / Self Care | Payer: Medicare Other | Source: Ambulatory Visit | Attending: Thoracic Surgery (Cardiothoracic Vascular Surgery) | Admitting: Thoracic Surgery (Cardiothoracic Vascular Surgery)

## 2019-05-12 ENCOUNTER — Inpatient Hospital Stay (HOSPITAL_COMMUNITY): Payer: Medicare Other | Attending: Hematology | Admitting: Hematology

## 2019-05-12 ENCOUNTER — Encounter (HOSPITAL_COMMUNITY)
Admission: RE | Admit: 2019-05-12 | Discharge: 2019-05-12 | Disposition: A | Discharge status: Home / Self Care | Payer: Medicare Other | Source: Ambulatory Visit | Attending: Thoracic Surgery (Cardiothoracic Vascular Surgery) | Admitting: Thoracic Surgery (Cardiothoracic Vascular Surgery)

## 2019-05-12 DIAGNOSIS — C049 Malignant neoplasm of floor of mouth, unspecified: Secondary | ICD-10-CM | POA: Insufficient documentation

## 2019-05-12 DIAGNOSIS — R911 Solitary pulmonary nodule: Secondary | ICD-10-CM

## 2019-05-12 DIAGNOSIS — Z01812 Encounter for preprocedural laboratory examination: Secondary | ICD-10-CM | POA: Diagnosis not present

## 2019-05-12 DIAGNOSIS — Z1159 Encounter for screening for other viral diseases: Secondary | ICD-10-CM | POA: Diagnosis not present

## 2019-05-12 HISTORY — DX: Blindness, one eye, unspecified eye: H54.40

## 2019-05-12 HISTORY — DX: Polyneuropathy, unspecified: G62.9

## 2019-05-12 HISTORY — DX: Malignant (primary) neoplasm, unspecified: C80.1

## 2019-05-12 HISTORY — DX: Dyspnea, unspecified: R06.00

## 2019-05-12 LAB — COMPREHENSIVE METABOLIC PANEL
ALT: 13 U/L (ref 0–44)
AST: 16 U/L (ref 15–41)
Albumin: 4 g/dL (ref 3.5–5.0)
Alkaline Phosphatase: 99 U/L (ref 38–126)
Anion gap: 7 (ref 5–15)
BUN: <5 mg/dL — ABNORMAL LOW (ref 8–23)
CO2: 26 mmol/L (ref 22–32)
Calcium: 9.7 mg/dL (ref 8.9–10.3)
Chloride: 107 mmol/L (ref 98–111)
Creatinine, Ser: 0.6 mg/dL — ABNORMAL LOW (ref 0.61–1.24)
GFR calc Af Amer: >60 mL/min (ref >60)
GFR calc non Af Amer: >60 mL/min (ref >60)
Glucose, Bld: 130 mg/dL — ABNORMAL HIGH (ref 70–99)
Potassium: 4 mmol/L (ref 3.5–5.1)
Sodium: 140 mmol/L (ref 135–145)
Total Bilirubin: 0.3 mg/dL (ref 0.3–1.2)
Total Protein: 6.4 g/dL — ABNORMAL LOW (ref 6.5–8.1)

## 2019-05-12 LAB — GLUCOSE, CAPILLARY: Glucose-Capillary: 136 mg/dL — ABNORMAL HIGH (ref 70–99)

## 2019-05-12 LAB — CBC
HCT: 46 % (ref 39.0–52.0)
Hemoglobin: 14.1 g/dL (ref 13.0–17.0)
MCH: 27.8 pg (ref 26.0–34.0)
MCHC: 30.7 g/dL (ref 30.0–36.0)
MCV: 90.7 fL (ref 80.0–100.0)
Platelets: 282 10*3/uL (ref 150–400)
RBC: 5.07 MIL/uL (ref 4.22–5.81)
RDW: 15.9 % — ABNORMAL HIGH (ref 11.5–15.5)
WBC: 8.3 10*3/uL (ref 4.0–10.5)
nRBC: 0 % (ref 0.0–0.2)

## 2019-05-12 LAB — PROTIME-INR
INR: 1.1 (ref 0.8–1.2)
Prothrombin Time: 14 seconds (ref 11.4–15.2)

## 2019-05-12 LAB — APTT: aPTT: 29 seconds (ref 24–36)

## 2019-05-12 NOTE — Assessment & Plan Note (Signed)
1.  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 Reynaldo Minium at [facility], pathology showing invasive squamous cell carcinoma, spindle cell carcinoma/carcinosarcoma variant, tumor measuring 2.9 cm, invades into the mandible, margins negative, no lymphovascular invasion, perineural invasion present.  Metastatic carcinoma involving 1 lymph node, largest focus 0.7 cm.  No extranodal extension. -PET CT scan on 04/05/2019 shows 1.4 cm gastrohepatic lymph node (SUV 4.5).  There is also a new 1 cm left upper lobe lung nodule with FDG uptake.  Cavitary left lung nodule is stable without hypermetabolism. - Because of adverse features like positive perineural invasion and aggressive histology, systemic therapy with RT can be considered. -However metastatic disease to be ruled out.  He was seen by Dr. Roxan Hockey for the left upper lobe lung nodule.  He is scheduled to have a navigational bronchoscopy and biopsy on 05/16/2019. -I have discussed with him about the need for biopsy of the gastrohepatic lymph node.  I will send him to Dr. Ardis Hughs for EUS biopsy.   - I will see him back in 2 weeks for follow-up.  He already has a port placed.  He would be a candidate for weekly cisplatin if there is no metastatic disease.   2.  Nutrition: -He has a PEG tube.  He is taking in 4 cans of Osmolite 1.5.  He is also eating soft foods by mouth.

## 2019-05-12 NOTE — Pre-Procedure Instructions (Addendum)
Lance [name]  05/12/2019     Your procedure is scheduled on Monday, June 8.  Report to [facility], Main Entrance or Entrance "A" at 5:30 AM                  Your surgery or procedure is scheduled for 7:30 A.M.   Call this number if you have problems the morning of surgery: [phone]  This is the number for the Pre- Surgical Desk.`    DO NOT take canagliflozin [facility]) on Sunday.   Remember:  Do not eat or drink after midnight Sunday, June 8.    Take these medicines the morning of surgery with A SIP OF WATER : gabapentin (NEURONTIN) omeprazole (PRILOSEC) pantoprazole (PROTONIX)   Take if needed: Oxycodnone Albuterol inhaler if needed  How to Manage Your Diabetes Before and After Surgery  Why is it important to control my blood sugar before and after surgery? . Improving blood sugar levels before and after surgery helps healing and can limit problems. . A way of improving blood sugar control is eating a healthy diet by: o  Eating less sugar and carbohydrates o  Increasing activity/exercise o  Talking with your doctor about reaching your blood sugar goals . High blood sugars (greater than 180 mg/dL) can raise your risk of infections and slow your recovery, so you will need to focus on controlling your diabetes during the weeks before surgery. . Make sure that the doctor who takes care of your diabetes knows about your planned surgery including the date and location.  How do I manage my blood sugar before surgery? . Check your blood sugar at least 4 times a day, starting 2 days before surgery, to make sure that the level is not too high or low. o Check your blood sugar the morning of your surgery when you wake up and every 2 hours until you get to the Short Stay unit. . If your blood sugar is less than 70 mg/dL, you will need to treat for low blood sugar: o Do not take insulin. o Treat a low blood sugar (less than 70 mg/dL) with  cup of clear juice (cranberry  or apple), 4 glucose tablets, OR glucose gel. Recheck blood sugar in 15 minutes after treatment (to make sure it is greater than 70 mg/dL). If your blood sugar is not greater than 70 mg/dL on recheck, call [phone] o  for further instructions. . Report your blood sugar to the short stay nurse when you get to Short Stay.  . If you are admitted to the hospital after surgery: o Your blood sugar will be checked by the staff and you will probably be given insulin after surgery (instead of oral diabetes medicines) to make sure you have good blood sugar levels. o The goal for blood sugar control after surgery is 80-180 mg/dL.  WHAT DO I DO ABOUT MY DIABETES MEDICATION? DO NOT take canagliflozin (INVOKANA) on Sunday  . Do not take oral diabetes medicines (pills) the morning of surgery.       [place]- Preparing For Surgery  Before surgery, you can play an important role. Because skin is not sterile, your skin needs to be as free of germs as possible. You can reduce the number of germs on your skin by washing with CHG (chlorahexidine gluconate) Soap before surgery.  CHG is an antiseptic cleaner which kills germs and bonds with the skin to continue killing germs even after washing.  Oral Hygiene is also important to reduce your risk of infection.  Remember - BRUSH YOUR TEETH THE MORNING OF SURGERY WITH YOUR REGULAR TOOTHPASTE  Please do not use if you have an allergy to CHG or antibacterial soaps. If your skin becomes reddened/irritated stop using the CHG.  Do not shave (including legs and underarms) for at least 48 hours prior to first CHG shower. It is OK to shave your face.  Please follow these instructions carefully.   1. Shower the NIGHT BEFORE SURGERY and the MORNING OF SURGERY with CHG.   2. If you chose to wash your hair, wash your hair first as usual with your normal shampoo.  3. After you shampoo, wash your face and private area with the soap you use at home, then rinse your  hair and body thoroughly to remove the shampoo and soap.  4. Use CHG as you would any other liquid soap. You can apply CHG directly to the skin and wash gently with a scrungie or a clean washcloth.   5. Apply the CHG Soap to your body ONLY FROM THE NECK DOWN.  Do not use on open wounds or open sores. Avoid contact with your eyes, ears, mouth and genitals (private parts).   6. Wash thoroughly, paying special attention to the area where your surgery will be performed.  7. Thoroughly rinse your body with warm water from the neck down.  8. DO NOT shower/wash with your normal soap after using and rinsing off the CHG Soap.  9. Pat yourself dry with a CLEAN TOWEL.  10. Wear CLEAN PAJAMAS to bed the night before surgery, wear comfortable clothes the morning of surgery  11. Place CLEAN SHEETS on your bed the night of your first shower and DO NOT SLEEP WITH PETS.  Day of Surgery: Shower as Instructed above   Do not wear lotions, powders, or perfumes, or deodorant.Please wear clean clothes to the hospital/surgery center.   Remember to brush your teeth WITH YOUR REGULAR TOOTHPASTE.  Do not wear jewelry, make-up or nail polish.  Do not shave 48 hours prior to surgery.  Men may shave face and neck.  Do not bring valuables to the hospital.  [facility] is not responsible for any belongings or valuables.  Contacts, dentures or bridgework may not be worn into surgery.  Leave your suitcase in the car.  After surgery it may be brought to your room.  For patients admitted to the hospital, discharge time will be determined by your treatment team.  Patients discharged the day of surgery will not be allowed to drive home.   Please read over the following fact sheets that you were given.

## 2019-05-12 NOTE — Telephone Encounter (Signed)
Hi Lance [name], there is an urgent referral for pt to schedule EUS with Dr. Ardis Hughs.  "Dr. Malachy Moan discussed with Dr. Ardis Hughs about the need for biopsy of the gastrohepatic lymph node. I will send him to Dr. Ardis Hughs for EUS biopsy"

## 2019-05-12 NOTE — Patient Instructions (Signed)
[place] at [facility] Discharge Instructions  You were seen today by Dr. Delton Coombes. He went over your recent lab results. He will see you back 2 weeks for follow up.   Thank you for choosing [place] at [facility] to provide your oncology and hematology care.  To afford each patient quality time with our provider, please arrive at least 15 minutes before your scheduled appointment time.   If you have a lab appointment with the [facility] please come in thru the  Main Entrance and check in at the main information desk  You need to re-schedule your appointment should you arrive 10 or more minutes late.  We strive to give you quality time with our providers, and arriving late affects you and other patients whose appointments are after yours.  Also, if you no show three or more times for appointments you may be dismissed from the clinic at the providers discretion.     Again, thank you for choosing [facility].  Our hope is that these requests will decrease the amount of time that you wait before being seen by our physicians.       _____________________________________________________________  Should you have questions after your visit to [facility], please contact our office at [phone] between the hours of 8:00 a.m. and 4:30 p.m.  Voicemails left after 4:00 p.m. will not be returned until the following business day.  For prescription refill requests, have your pharmacy contact our office and allow 72 hours.    Cancer Center Support Programs:   > Cancer Support Group  2nd Tuesday of the month 1pm-2pm, Journey Room

## 2019-05-12 NOTE — Progress Notes (Signed)
PCP - Dr Manuella Ghazi  Cardiologist - no  Chest x-ray - DOS  EKG -   Stress Test - no  ECHO - no  Cardiac Cath - no  Sleep Study - no CPAP - no  LABS-CBC , BMP, PT, PTT  ASA-no  ERAS-no  HA1C-7.0 01/2019 Fasting Blood Sugar - 125 Checks Blood Sugar ___1__ times a day  Anesthesia-  Pt denies having chest pain, sob, or fever at this time. All instructions explained to the pt, with a verbal understanding of the material. Pt agrees to go over the instructions while at home for a better understanding. The opportunity to ask questions was provided.  Lance [name] denies that he nor her family has experienced any of the following: Cough Fever >100.4 Runny Nose Sore Throat Difficulty breathing/ shortness of breath Travel in past 14 days- no Patient will be tested for COVID after PAT  Lance [name] states that he will have a hard time arriving her at 5:30 am, he has vision in right eye only and had poor night time vision, his girlfriend vision is poor also.  Patient reports that he told [name] that the would have difficulty getting her at  5:50 AM
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
## 2019-05-13 LAB — NOVEL CORONAVIRUS, NAA (HOSP ORDER, SEND-OUT TO REF LAB; TAT 18-24 HRS): SARS-CoV-2, NAA: NOT DETECTED

## 2019-05-13 NOTE — Telephone Encounter (Signed)
I responded to Dr. Raliegh Ip directly.  I recommended waiting to see what the bronch (next week) biospies show.  If they are non-diagnostic then I think EUS attempt is a good idea. I have concerns about the wide diameter EUS scope getting through his throat and into good position since he's had HN surgery and needs a G tube for nutrition.    I will await word from Dr. Raliegh Ip, for now no plans on EUS.

## 2019-05-13 NOTE — Telephone Encounter (Signed)
See Dr Ardis Hughs note

## 2019-05-13 NOTE — Telephone Encounter (Signed)
Dr Jacobs please advise  

## 2019-05-16 ENCOUNTER — Other Ambulatory Visit: Payer: Self-pay

## 2019-05-16 ENCOUNTER — Encounter (HOSPITAL_COMMUNITY): Payer: Self-pay | Admitting: General Practice

## 2019-05-16 ENCOUNTER — Ambulatory Visit (HOSPITAL_COMMUNITY): Payer: Medicare Other | Admitting: Certified Registered"

## 2019-05-16 ENCOUNTER — Ambulatory Visit (HOSPITAL_COMMUNITY)
Admission: RE | Admit: 2019-05-16 | Discharge: 2019-05-16 | Disposition: A | Discharge status: Home / Self Care | Payer: Medicare Other | Attending: Thoracic Surgery (Cardiothoracic Vascular Surgery) | Admitting: Thoracic Surgery (Cardiothoracic Vascular Surgery)

## 2019-05-16 ENCOUNTER — Ambulatory Visit (HOSPITAL_COMMUNITY): Payer: Medicare Other

## 2019-05-16 ENCOUNTER — Encounter (HOSPITAL_COMMUNITY)
Admission: RE | Disposition: A | Discharge status: Home / Self Care | Payer: Self-pay | Source: Home / Self Care | Attending: Thoracic Surgery (Cardiothoracic Vascular Surgery)

## 2019-05-16 DIAGNOSIS — J449 Chronic obstructive pulmonary disease, unspecified: Secondary | ICD-10-CM | POA: Diagnosis not present

## 2019-05-16 DIAGNOSIS — Z794 Long term (current) use of insulin: Secondary | ICD-10-CM | POA: Diagnosis not present

## 2019-05-16 DIAGNOSIS — F329 Major depressive disorder, single episode, unspecified: Secondary | ICD-10-CM | POA: Insufficient documentation

## 2019-05-16 DIAGNOSIS — Z79891 Long term (current) use of opiate analgesic: Secondary | ICD-10-CM | POA: Insufficient documentation

## 2019-05-16 DIAGNOSIS — Z87891 Personal history of nicotine dependence: Secondary | ICD-10-CM | POA: Diagnosis not present

## 2019-05-16 DIAGNOSIS — Z01818 Encounter for other preprocedural examination: Secondary | ICD-10-CM | POA: Diagnosis not present

## 2019-05-16 DIAGNOSIS — R848 Other abnormal findings in specimens from respiratory organs and thorax: Secondary | ICD-10-CM | POA: Diagnosis not present

## 2019-05-16 DIAGNOSIS — M1990 Unspecified osteoarthritis, unspecified site: Secondary | ICD-10-CM | POA: Diagnosis not present

## 2019-05-16 DIAGNOSIS — Z9221 Personal history of antineoplastic chemotherapy: Secondary | ICD-10-CM | POA: Diagnosis not present

## 2019-05-16 DIAGNOSIS — Z85819 Personal history of malignant neoplasm of unspecified site of lip, oral cavity, and pharynx: Secondary | ICD-10-CM | POA: Diagnosis not present

## 2019-05-16 DIAGNOSIS — M109 Gout, unspecified: Secondary | ICD-10-CM | POA: Insufficient documentation

## 2019-05-16 DIAGNOSIS — I10 Essential (primary) hypertension: Secondary | ICD-10-CM | POA: Insufficient documentation

## 2019-05-16 DIAGNOSIS — Z85818 Personal history of malignant neoplasm of other sites of lip, oral cavity, and pharynx: Secondary | ICD-10-CM | POA: Diagnosis not present

## 2019-05-16 DIAGNOSIS — E119 Type 2 diabetes mellitus without complications: Secondary | ICD-10-CM | POA: Insufficient documentation

## 2019-05-16 DIAGNOSIS — K219 Gastro-esophageal reflux disease without esophagitis: Secondary | ICD-10-CM | POA: Insufficient documentation

## 2019-05-16 DIAGNOSIS — Z7982 Long term (current) use of aspirin: Secondary | ICD-10-CM | POA: Insufficient documentation

## 2019-05-16 DIAGNOSIS — Z419 Encounter for procedure for purposes other than remedying health state, unspecified: Secondary | ICD-10-CM

## 2019-05-16 DIAGNOSIS — Z79899 Other long term (current) drug therapy: Secondary | ICD-10-CM | POA: Diagnosis not present

## 2019-05-16 DIAGNOSIS — R911 Solitary pulmonary nodule: Secondary | ICD-10-CM | POA: Diagnosis not present

## 2019-05-16 DIAGNOSIS — C3412 Malignant neoplasm of upper lobe, left bronchus or lung: Secondary | ICD-10-CM | POA: Diagnosis not present

## 2019-05-16 DIAGNOSIS — C3432 Malignant neoplasm of lower lobe, left bronchus or lung: Secondary | ICD-10-CM | POA: Diagnosis not present

## 2019-05-16 HISTORY — PX: VIDEO BRONCHOSCOPY WITH ENDOBRONCHIAL NAVIGATION: SHX6175

## 2019-05-16 LAB — GLUCOSE, CAPILLARY
Glucose-Capillary: 109 mg/dL — ABNORMAL HIGH (ref 70–99)
Glucose-Capillary: 92 mg/dL (ref 70–99)

## 2019-05-16 SURGERY — VIDEO BRONCHOSCOPY WITH ENDOBRONCHIAL NAVIGATION
Anesthesia: General

## 2019-05-16 MED ORDER — SUCCINYLCHOLINE CHLORIDE 200 MG/10ML IV SOSY
PREFILLED_SYRINGE | INTRAVENOUS | Status: AC
  Filled 2019-05-16: qty 10

## 2019-05-16 MED ORDER — ONDANSETRON HCL 4 MG/2ML IJ SOLN
INTRAMUSCULAR | Status: AC
  Filled 2019-05-16: qty 2

## 2019-05-16 MED ORDER — FENTANYL CITRATE (PF) 250 MCG/5ML IJ SOLN
INTRAMUSCULAR | Status: AC
  Filled 2019-05-16: qty 5

## 2019-05-16 MED ORDER — EPINEPHRINE PF 1 MG/ML IJ SOLN
INTRAMUSCULAR | Status: DC | PRN
  Administered 2019-05-16: 1 mg

## 2019-05-16 MED ORDER — LIDOCAINE 2% (20 MG/ML) 5 ML SYRINGE
INTRAMUSCULAR | Status: DC | PRN
  Administered 2019-05-16: 100 mg via INTRAVENOUS

## 2019-05-16 MED ORDER — FENTANYL CITRATE (PF) 100 MCG/2ML IJ SOLN
INTRAMUSCULAR | Status: DC | PRN
  Administered 2019-05-16 (×2): 50 ug via INTRAVENOUS

## 2019-05-16 MED ORDER — PROMETHAZINE HCL 25 MG/ML IJ SOLN: 6.2500 mg | INTRAMUSCULAR | Status: DC | PRN

## 2019-05-16 MED ORDER — SUCCINYLCHOLINE CHLORIDE 200 MG/10ML IV SOSY
PREFILLED_SYRINGE | INTRAVENOUS | Status: DC | PRN
  Administered 2019-05-16: 100 mg via INTRAVENOUS

## 2019-05-16 MED ORDER — ONDANSETRON HCL 4 MG/2ML IJ SOLN
INTRAMUSCULAR | Status: DC | PRN
  Administered 2019-05-16: 4 mg via INTRAVENOUS

## 2019-05-16 MED ORDER — DEXAMETHASONE SODIUM PHOSPHATE 10 MG/ML IJ SOLN
INTRAMUSCULAR | Status: DC | PRN
  Administered 2019-05-16: 8 mg via INTRAVENOUS

## 2019-05-16 MED ORDER — PHENYLEPHRINE 40 MCG/ML (10ML) SYRINGE FOR IV PUSH (FOR BLOOD PRESSURE SUPPORT)
PREFILLED_SYRINGE | INTRAVENOUS | Status: AC
  Filled 2019-05-16: qty 10

## 2019-05-16 MED ORDER — MIDAZOLAM HCL 2 MG/2ML IJ SOLN
INTRAMUSCULAR | Status: AC
  Filled 2019-05-16: qty 2

## 2019-05-16 MED ORDER — FENTANYL CITRATE (PF) 100 MCG/2ML IJ SOLN: 25.0000 ug | INTRAMUSCULAR | Status: DC | PRN

## 2019-05-16 MED ORDER — PROPOFOL 10 MG/ML IV BOLUS
INTRAVENOUS | Status: AC
  Filled 2019-05-16: qty 20

## 2019-05-16 MED ORDER — ROCURONIUM BROMIDE 50 MG/5ML IV SOSY
PREFILLED_SYRINGE | INTRAVENOUS | Status: DC | PRN
  Administered 2019-05-16: 50 mg via INTRAVENOUS

## 2019-05-16 MED ORDER — LIDOCAINE 2% (20 MG/ML) 5 ML SYRINGE
INTRAMUSCULAR | Status: AC
  Filled 2019-05-16: qty 5

## 2019-05-16 MED ORDER — DEXAMETHASONE SODIUM PHOSPHATE 10 MG/ML IJ SOLN
INTRAMUSCULAR | Status: AC
  Filled 2019-05-16: qty 1

## 2019-05-16 MED ORDER — 0.9 % SODIUM CHLORIDE (POUR BTL) OPTIME
TOPICAL | Status: DC | PRN
  Administered 2019-05-16: 1000 mL

## 2019-05-16 MED ORDER — GLYCOPYRROLATE PF 0.2 MG/ML IJ SOSY
PREFILLED_SYRINGE | INTRAMUSCULAR | Status: DC | PRN
  Administered 2019-05-16: .1 mg via INTRAVENOUS

## 2019-05-16 MED ORDER — MIDAZOLAM HCL 5 MG/5ML IJ SOLN
INTRAMUSCULAR | Status: DC | PRN
  Administered 2019-05-16: 2 mg via INTRAVENOUS

## 2019-05-16 MED ORDER — SUGAMMADEX SODIUM 200 MG/2ML IV SOLN
INTRAVENOUS | Status: DC | PRN
  Administered 2019-05-16: 200 mg via INTRAVENOUS

## 2019-05-16 MED ORDER — LACTATED RINGERS IV SOLN
INTRAVENOUS | Status: DC | PRN
  Administered 2019-05-16: 12:00:00 via INTRAVENOUS

## 2019-05-16 MED ORDER — ROCURONIUM BROMIDE 10 MG/ML (PF) SYRINGE
PREFILLED_SYRINGE | INTRAVENOUS | Status: AC
  Filled 2019-05-16: qty 10

## 2019-05-16 MED ORDER — PROPOFOL 10 MG/ML IV BOLUS
INTRAVENOUS | Status: DC | PRN
  Administered 2019-05-16: 150 mg via INTRAVENOUS

## 2019-05-16 MED ORDER — LACTATED RINGERS IV SOLN
INTRAVENOUS | Status: DC
  Administered 2019-05-16: 12:00:00 via INTRAVENOUS

## 2019-05-16 MED ORDER — EPINEPHRINE PF 1 MG/ML IJ SOLN
INTRAMUSCULAR | Status: AC
  Filled 2019-05-16: qty 1

## 2019-05-16 SURGICAL SUPPLY — 46 items
ADAPTER BRONCHOSCOPE OLYMPUS (ADAPTER) ×2 IMPLANT
ADAPTER VALVE BIOPSY EBUS (MISCELLANEOUS) IMPLANT
ADPTR VALVE BIOPSY EBUS (MISCELLANEOUS)
BRUSH BIOPSY BRONCH 10 SDTNB (MISCELLANEOUS) IMPLANT
BRUSH SUPERTRAX BIOPSY (INSTRUMENTS) IMPLANT
BRUSH SUPERTRAX NDL-TIP CYTO (INSTRUMENTS) ×2 IMPLANT
CANISTER SUCT 3000ML PPV (MISCELLANEOUS) ×2 IMPLANT
CHANNEL WORK EXTEND EDGE 180 (KITS) IMPLANT
CHANNEL WORK EXTEND EDGE 90 (KITS) IMPLANT
CONT SPEC 4OZ CLIKSEAL STRL BL (MISCELLANEOUS) ×4 IMPLANT
COVER BACK TABLE 60X90IN (DRAPES) ×2 IMPLANT
COVER WAND RF STERILE (DRAPES) ×2 IMPLANT
COVIDIEN SUPER LOCK FIDUCIAL MARKER 4MM X 7 MM ×6 IMPLANT
FILTER STRAW FLUID ASPIR (MISCELLANEOUS) IMPLANT
FORCEPS BIOP SUPERTRX PREMAR (INSTRUMENTS) ×2 IMPLANT
GAUZE SPONGE 4X4 12PLY STRL (GAUZE/BANDAGES/DRESSINGS) ×2 IMPLANT
GLOVE SURG SIGNA 7.5 PF LTX (GLOVE) ×2 IMPLANT
GOWN STRL REUS W/ TWL XL LVL3 (GOWN DISPOSABLE) ×1 IMPLANT
GOWN STRL REUS W/TWL XL LVL3 (GOWN DISPOSABLE) ×1
KIT CLEAN ENDO COMPLIANCE (KITS) ×2 IMPLANT
KIT MARKER FIDUCIAL DELIVERY (KITS) ×2 IMPLANT
KIT PROCEDURE EDGE 180 (KITS) ×2 IMPLANT
KIT PROCEDURE EDGE 90 (KITS) IMPLANT
KIT TURNOVER KIT B (KITS) ×2 IMPLANT
MARKER FIDUCIAL SL NIT COIL (Implant Marker) ×6 IMPLANT
MARKER SKIN DUAL TIP RULER LAB (MISCELLANEOUS) ×2 IMPLANT
NEEDLE SUPERTRX PREMARK BIOPSY (NEEDLE) ×2 IMPLANT
NS IRRIG 1000ML POUR BTL (IV SOLUTION) ×2 IMPLANT
OIL SILICONE PENTAX (PARTS (SERVICE/REPAIRS)) ×2 IMPLANT
PAD ARMBOARD 7.5X6 YLW CONV (MISCELLANEOUS) ×4 IMPLANT
PATCHES PATIENT (LABEL) ×6 IMPLANT
SYR 10ML LL (SYRINGE) ×2 IMPLANT
SYR 20CC LL (SYRINGE) ×2 IMPLANT
SYR 20ML ECCENTRIC (SYRINGE) ×2 IMPLANT
SYR 30ML LL (SYRINGE) ×2 IMPLANT
SYR 5ML LL (SYRINGE) ×2 IMPLANT
SYRINGE 20CC LL (MISCELLANEOUS) ×2 IMPLANT
TOWEL GREEN STERILE (TOWEL DISPOSABLE) ×2 IMPLANT
TOWEL GREEN STERILE FF (TOWEL DISPOSABLE) ×2 IMPLANT
TRAP SPECIMEN MUCOUS 40CC (MISCELLANEOUS) ×2 IMPLANT
TUBE CONNECTING 20X1/4 (TUBING) ×4 IMPLANT
UNDERPAD 30X30 (UNDERPADS AND DIAPERS) ×2 IMPLANT
VALVE BIOPSY  SINGLE USE (MISCELLANEOUS) ×1
VALVE BIOPSY SINGLE USE (MISCELLANEOUS) ×1 IMPLANT
VALVE SUCTION BRONCHIO DISP (MISCELLANEOUS) ×2 IMPLANT
WATER STERILE IRR 1000ML POUR (IV SOLUTION) ×2 IMPLANT

## 2019-05-16 NOTE — Anesthesia Postprocedure Evaluation (Signed)
Anesthesia Post Note  Patient: NIK GORRELL  Procedure(s) Performed: VIDEO BRONCHOSCOPY WITH ENDOBRONCHIAL NAVIGATION AND FIDUCIAL MARKER PLACEMENT (N/A )     Patient location during evaluation: PACU Anesthesia Type: General Level of consciousness: awake and alert Pain management: pain level controlled Vital Signs Assessment: post-procedure vital signs reviewed and stable Respiratory status: spontaneous breathing, nonlabored ventilation, respiratory function stable and patient connected to nasal cannula oxygen Cardiovascular status: blood pressure returned to baseline and stable Postop Assessment: no apparent nausea or vomiting Anesthetic complications: no    Last Vitals:  Vitals:   05/16/19 1615 05/16/19 1645  BP: (!) 172/87 (!) 169/90  Pulse: 70 64  Resp: 13 12  Temp: (!) 36.2 C   SpO2: 100% 100%    Last Pain:  Vitals:   05/16/19 1645  TempSrc:   PainSc: 0-No pain                 ROSE,GEORGE S

## 2019-05-16 NOTE — Anesthesia Preprocedure Evaluation (Signed)
Anesthesia Evaluation  Patient identified by MRN, date of birth, ID band Patient awake    Reviewed: Allergy & Precautions, NPO status , Patient's Chart, lab work & pertinent test results  Airway Mallampati: III  TM Distance: <3 FB Neck ROM: Limited    Dental no notable dental hx.    Pulmonary COPD, former smoker,    Pulmonary exam normal breath sounds clear to auscultation       Cardiovascular hypertension, Normal cardiovascular exam Rhythm:Regular Rate:Normal     Neuro/Psych negative neurological ROS  negative psych ROS   GI/Hepatic negative GI ROS, Neg liver ROS,   Endo/Other  diabetes  Renal/GU negative Renal ROS  negative genitourinary   Musculoskeletal negative musculoskeletal ROS (+)   Abdominal   Peds negative pediatric ROS (+)  Hematology negative hematology ROS (+)   Anesthesia Other Findings   Reproductive/Obstetrics negative OB ROS                             Anesthesia Physical Anesthesia Plan  ASA: III  Anesthesia Plan: General   Post-op Pain Management:    Induction: Intravenous  PONV Risk Score and Plan: 2 and Ondansetron, Dexamethasone and Treatment may vary due to age or medical condition  Airway Management Planned: Oral ETT and Video Laryngoscope Planned  Additional Equipment:   Intra-op Plan:   Post-operative Plan: Extubation in OR  Informed Consent: I have reviewed the patients History and Physical, chart, labs and discussed the procedure including the risks, benefits and alternatives for the proposed anesthesia with the patient or authorized representative who has indicated his/her understanding and acceptance.     Dental advisory given  Plan Discussed with: CRNA and Surgeon  Anesthesia Plan Comments:         Anesthesia Quick Evaluation

## 2019-05-16 NOTE — Brief Op Note (Signed)
05/16/2019  4:34 PM  PATIENT:  Abner Greenspan  68 y.o. male  PRE-OPERATIVE DIAGNOSIS:  LUL NODULE  POST-OPERATIVE DIAGNOSIS:  LUL NODULE  PROCEDURE:  Procedure(s): VIDEO BRONCHOSCOPY WITH ENDOBRONCHIAL NAVIGATION AND FIDUCIAL MARKER PLACEMENT (N/A) needle aspirations, brushings and transbronchial biopsies  SURGEON:  Surgeon(s) and Role:    * Melrose Nakayama, MD - Primary  PHYSICIAN ASSISTANT:   ASSISTANTS: none   ANESTHESIA:   general  EBL: minimal  BLOOD ADMINISTERED:none  DRAINS: none   LOCAL MEDICATIONS USED:  NONE  SPECIMEN:  Source of Specimen:  LUL nodule  DISPOSITION OF SPECIMEN:  PATHOLOGY  COUNTS:  NO endoscopic  TOURNIQUET:  * No tourniquets in log *  DICTATION: .Other Dictation: Dictation Number -  PLAN OF CARE: Discharge to home after PACU  PATIENT DISPOSITION:  PACU - hemodynamically stable.   Delay start of Pharmacological VTE agent (>24hrs) due to surgical blood loss or risk of bleeding: not applicable

## 2019-05-16 NOTE — Interval H&P Note (Signed)
History and Physical Interval Note:  05/16/2019 1:59 PM  Lance [name]  has presented today for surgery, with the diagnosis of LUL NODULE.  The various methods of treatment have been discussed with the patient and family. After consideration of risks, benefits and other options for treatment, the patient has consented to  Procedure(s): [place] (N/A) as a surgical intervention.  The patient's history has been reviewed, patient examined, no change in status, stable for surgery.  I have reviewed the patient's chart and labs.  Questions were answered to the patient's satisfaction.     Melrose Nakayama

## 2019-05-16 NOTE — Discharge Instructions (Addendum)
Do not drive or engage in heavy physical activity for 24 hours You may resume normal activities tomorrow You may cough up small amounts of blood over the next few days You may use acetaminophen (Tylenol) if needed for discomfort, you may also use the oxycodone you already have if needed. Call [phone] if you develop chest pain, shortness of breath, fever > 101 F or cough up more than 2 tablespoons of blood Follow up as scheduled with Dr. Delton Coombes

## 2019-05-16 NOTE — Op Note (Signed)
NAME: Lance, [name] MEDICAL RECORD [id number] ACCOUNT [id number] DATE OF BIRTH:[birth year] FACILITY: [facility] LOCATION: [facility] PHYSICIAN:STEVEN C. 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  PROCEDURE:  Electromagnetic navigational bronchoscopy with needle aspirations, brushings, transbronchial biopsies and fiducial placement.  SURGEON:  Modesto Charon, MD  ASSISTANT:  None.  ANESTHESIA:  General.  FINDINGS:  Navigated to within 1 cm of the nodule.  Brushings showed malignant cells.  CLINICAL NOTE:  Lance [name] is a 68 year old man with a recent history of a squamous cell carcinoma of the floor of the mouth for which he has had resection.  A recent CT scan showed an increased size of the left upper lobe nodule.  He was advised to  undergo navigational bronchoscopy for biopsy.  The indications, risks, benefits, and alternatives were discussed in detail with the patient.  He understood and accepted the risks and agreed to proceed.  OPERATIVE NOTE:  Lance [name] was brought to the operating room on 05/16/2019.  Planning for the navigational bronchoscopy was done in the operating room prior to the patient being put to sleep.  He was anesthetized and intubated.  A timeout was  performed.  Flexible fiberoptic bronchoscopy was performed via the endotracheal tube.  It revealed normal endobronchial anatomy with no endobronchial lesions to the level of the subsegmental bronchi.  There were thick clear secretions bilaterally.  The  locatable guide for navigation was placed and registration was performed.  The bronchoscope then was advanced to the left upper lobe bronchus, and the locatable guide was advanced through the appropriate subsegmental bronchus.  Two separate  pathways had  been mapped.  On the first pathway, there was good alignment but I could get no closer than about 2 cm of the lesion.  On the second pathway, the catheter was advanced to within a centimeter of the lesion.  Local registration was performed, and ultimately  the catheter ended up within 8 mm of the center of the lesion with good alignment.  Needle aspirations were performed.  During the first needle aspiration, the catheter became dislodged, and it was re-navigated into the same position.  Additional needle  aspirations were performed, and needle brushings were performed as well.  All sampling was done under fluoroscopy.  While awaiting the quick preps on the samples, biopsies were obtained.  The first 2 biopsies were sent for AFB and fungal cultures.   The remainder of the biopsies were sent for permanent pathology.  The locatable guide was reinserted after each third biopsy to ensure continued good alignment and proximity.  The brushings were felt to be adequate for malignant cells.  All the biopsies  were sent for permanent pathology.  There was minimal bleeding with the biopsies.  Mapping was performed for fiducial placement, and 3 fiducials were placed in sites as directed by the computer.  A final inspection with fluoroscopy revealed no  pneumothorax.  The total fluoroscopy time was 3 minutes with a total of 16.67 mGy of radiation used.  The patient then was extubated in the operating room and taken to the postanesthetic care unit in good condition.  LN/NUANCE  D:05/16/2019 T:05/16/2019 JOB:[id number]

## 2019-05-16 NOTE — Transfer of Care (Signed)
Immediate Anesthesia Transfer of Care Note  Patient: Lance [name]  Procedure(s) Performed: VIDEO BRONCHOSCOPY WITH ENDOBRONCHIAL NAVIGATION AND FIDUCIAL MARKER PLACEMENT (N/A )  Patient Location: PACU  Anesthesia Type:General  Level of Consciousness: awake and patient cooperative  Airway & Oxygen Therapy: Patient Spontanous Breathing and Patient connected to nasal cannula oxygen  Post-op Assessment: Report given to RN, Post -op Vital signs reviewed and stable and Patient moving all extremities X 4  Post vital signs: Reviewed and stable  Last Vitals:  Vitals Value Taken Time  BP 172/87 05/16/2019  4:16 PM  Temp    Pulse 70 05/16/2019  4:19 PM  Resp 15 05/16/2019  4:19 PM  SpO2 100 % 05/16/2019  4:19 PM  Vitals shown include unvalidated device data.  Last Pain:  Vitals:   05/16/19 1205  TempSrc: Oral  PainSc: 7       Patients Stated Pain Goal: 2 (90/12/22 4114)  Complications: No apparent anesthesia complications

## 2019-05-16 NOTE — Anesthesia Procedure Notes (Signed)
Procedure Name: Intubation Date/Time: 05/16/2019 2:58 PM Performed by: Orlie Dakin, CRNA Pre-anesthesia Checklist: Patient identified, Emergency Drugs available, Suction available and Patient being monitored Patient Re-evaluated:Patient Re-evaluated prior to induction Oxygen Delivery Method: Circle system utilized Preoxygenation: Pre-oxygenation with 100% oxygen Induction Type: IV induction Laryngoscope Size: Glidescope and 4 Grade View: Grade I Tube type: Oral Tube size: 8.5 mm Number of attempts: 1 Airway Equipment and Method: Stylet and Video-laryngoscopy Placement Confirmation: ETT inserted through vocal cords under direct vision,  positive ETCO2 and breath sounds checked- equal and bilateral Secured at: 23 cm Tube secured with: Tape Dental Injury: Teeth and Oropharynx as per pre-operative assessment  Comments: Glidescope used due to edematous left side of mouth, mandible and intra-orally.

## 2019-05-17 ENCOUNTER — Encounter (HOSPITAL_COMMUNITY): Payer: Self-pay | Admitting: Thoracic Surgery (Cardiothoracic Vascular Surgery)

## 2019-05-17 LAB — ACID FAST SMEAR (AFB, MYCOBACTERIA): Acid Fast Smear: NEGATIVE

## 2019-05-24 DIAGNOSIS — M48062 Spinal stenosis, lumbar region with neurogenic claudication: Secondary | ICD-10-CM | POA: Diagnosis not present

## 2019-05-24 DIAGNOSIS — G8929 Other chronic pain: Secondary | ICD-10-CM | POA: Diagnosis not present

## 2019-05-24 DIAGNOSIS — M792 Neuralgia and neuritis, unspecified: Secondary | ICD-10-CM | POA: Diagnosis not present

## 2019-05-24 DIAGNOSIS — M47897 Other spondylosis, lumbosacral region: Secondary | ICD-10-CM | POA: Diagnosis not present

## 2019-05-24 DIAGNOSIS — G894 Chronic pain syndrome: Secondary | ICD-10-CM | POA: Diagnosis not present

## 2019-05-24 DIAGNOSIS — E0843 Diabetes mellitus due to underlying condition with diabetic autonomic (poly)neuropathy: Secondary | ICD-10-CM | POA: Diagnosis not present

## 2019-05-24 DIAGNOSIS — M4726 Other spondylosis with radiculopathy, lumbar region: Secondary | ICD-10-CM | POA: Diagnosis not present

## 2019-05-24 DIAGNOSIS — M4807 Spinal stenosis, lumbosacral region: Secondary | ICD-10-CM | POA: Diagnosis not present

## 2019-05-26 ENCOUNTER — Other Ambulatory Visit: Payer: Self-pay | Admitting: *Deleted

## 2019-05-26 ENCOUNTER — Encounter: Payer: Self-pay | Admitting: *Deleted

## 2019-05-26 NOTE — Progress Notes (Signed)
The proposed treatment discussed in cancer conference 05/26/2019 is for discussion purpose only and is not a binding recommendation.  The patient was not physically examined nor present for their treatment options.  Therefore, final treatment plans cannot be decided.

## 2019-05-30 ENCOUNTER — Encounter (HOSPITAL_COMMUNITY): Payer: Self-pay | Admitting: Hematology

## 2019-05-30 ENCOUNTER — Inpatient Hospital Stay (HOSPITAL_BASED_OUTPATIENT_CLINIC_OR_DEPARTMENT_OTHER): Payer: Medicare Other | Admitting: Hematology

## 2019-05-30 ENCOUNTER — Other Ambulatory Visit: Payer: Self-pay

## 2019-05-30 VITALS — BP 124/60 | HR 86 | Temp 99.2°F | Resp 18 | Wt 160.5 lb

## 2019-05-30 DIAGNOSIS — Z95828 Presence of other vascular implants and grafts: Secondary | ICD-10-CM

## 2019-05-30 DIAGNOSIS — C049 Malignant neoplasm of floor of mouth, unspecified: Secondary | ICD-10-CM | POA: Diagnosis not present

## 2019-05-30 DIAGNOSIS — R911 Solitary pulmonary nodule: Secondary | ICD-10-CM | POA: Diagnosis not present

## 2019-05-30 NOTE — Patient Instructions (Signed)
[facility] at [facility] Discharge Instructions     Thank you for choosing [facility] at [facility] to provide your oncology and hematology care.  To afford each patient quality time with our provider, please arrive at least 15 minutes before your scheduled appointment time.   If you have a lab appointment with the Cancer Center please come in thru the  Main Entrance and check in at the main information desk  You need to re-schedule your appointment should you arrive 10 or more minutes late.  We strive to give you quality time with our providers, and arriving late affects you and other patients whose appointments are after yours.  Also, if you no show three or more times for appointments you may be dismissed from the clinic at the providers discretion.     Again, thank you for choosing [facility].  Our hope is that these requests will decrease the amount of time that you wait before being seen by our physicians.       _____________________________________________________________  Should you have questions after your visit to [facility], please contact our office at [phone] between the hours of 8:00 a.m. and 4:30 p.m.  Voicemails left after 4:00 p.m. will not be returned until the following business day.  For prescription refill requests, have your pharmacy contact our office and allow 72 hours.    Cancer Center Support Programs:   > Cancer Support Group  2nd Tuesday of the month 1pm-2pm, Journey Room    
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
## 2019-05-30 NOTE — Assessment & Plan Note (Signed)
1.  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 Verlan [facility] of [facility], pathology showing invasive squamous cell carcinoma, spindle cell carcinoma/carcinosarcoma variant, tumor measuring 2.9 cm, invades into the mandible, margins negative, no lymphovascular invasion, perineural invasion present.  Metastatic carcinoma involving 1 lymph node, largest focus 0.7 cm, no extranodal extension. -PET scan on 04/05/2019 shows 1.4 cm gastrohepatic lymph node SUV 4.5.  There is also 1 cm left upper lobe lung nodule with FDG uptake.  Cavitary left lung nodule is stable without hypermetabolism. - Because of adverse features like positive perineural invasion and aggressive histology, systemic therapy with RT was considered. - Navigational bronchoscopy and biopsy of the left upper lobe lung nodule by Dr. Roxan Hockey on 05/16/2019. - We discussed the pathology report consistent with non-small cell carcinoma. -His case was discussed at lung tumor board and SBRT was recommended. - I will talk to Dr.Yanagihara about the plan of chemoradiation.  We can proceed with chemoradiation with close monitoring of gastrohepatic lymph node.  SBRT can be done of the left upper lobe lung nodule after finishing chemoradiation.  2.  Nutrition: - He has a PEG tube.  He uses up to 4 cans of Osmolite 1.5 daily. -He is also able to eat soft foods by mouth.

## 2019-06-01 DIAGNOSIS — C049 Malignant neoplasm of floor of mouth, unspecified: Secondary | ICD-10-CM | POA: Diagnosis not present

## 2019-06-06 ENCOUNTER — Encounter (HOSPITAL_COMMUNITY): Payer: Self-pay | Admitting: *Deleted

## 2019-06-06 ENCOUNTER — Other Ambulatory Visit (HOSPITAL_COMMUNITY): Payer: Self-pay | Admitting: Hematology

## 2019-06-06 DIAGNOSIS — C049 Malignant neoplasm of floor of mouth, unspecified: Secondary | ICD-10-CM | POA: Diagnosis not present

## 2019-06-06 DIAGNOSIS — C040 Malignant neoplasm of anterior floor of mouth: Secondary | ICD-10-CM | POA: Diagnosis not present

## 2019-06-06 NOTE — Progress Notes (Signed)
START ON PATHWAY REGIMEN - Head and Neck     A cycle is every 7 days:     Cisplatin   **Always confirm dose/schedule in your pharmacy ordering system**  Patient Characteristics: Oral Cavity, Stage III, IVA; Resectable, Surgery Followed by Radiation/Postoperative Therapy, ENE or Positive Margins Disease Classification: Oral Cavity AJCC T Category: T4a Current Disease Status: No Distant Metastases and No Recurrent Disease AJCC M Category: M0 AJCC N Category: pN1 AJCC 8 Stage Grouping: IVA Intent of Therapy: Curative Intent, Discussed with Patient

## 2019-06-06 NOTE — Progress Notes (Signed)
I attempted to call patient today to give him his appointments for treatment next week. I was unable to reach him.  I left a vm for him to return my call when he can so we can review everything.  I left my contact number.

## 2019-06-07 ENCOUNTER — Encounter (HOSPITAL_COMMUNITY): Payer: Self-pay | Admitting: Hematology

## 2019-06-07 DIAGNOSIS — Z95828 Presence of other vascular implants and grafts: Secondary | ICD-10-CM | POA: Insufficient documentation

## 2019-06-07 HISTORY — DX: Presence of other vascular implants and grafts: Z95.828

## 2019-06-07 MED ORDER — LIDOCAINE-PRILOCAINE 2.5-2.5 % EX CREA: TOPICAL_CREAM | CUTANEOUS | 3 refills | Status: AC

## 2019-06-07 MED ORDER — PROCHLORPERAZINE MALEATE 10 MG PO TABS: 10.0000 mg | ORAL_TABLET | Freq: Four times a day (QID) | ORAL | 1 refills | Status: DC | PRN

## 2019-06-07 NOTE — Addendum Note (Signed)
Addended by: Joie Bimler on: 06/07/2019 01:02 PM   Modules accepted: Orders

## 2019-06-08 LAB — CULTURE, FUNGUS WITHOUT SMEAR

## 2019-06-08 NOTE — Patient Instructions (Addendum)
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  You will be treated weekly with cisplatin in conjunction with radiation therapy.  The intent of this treatment is to cure your cancer.  You will see the doctor regularly throughout treatment.  We monitor your lab work prior to every treatment. The doctor monitors your response to treatment by the way you are feeling, your blood work, and scans periodically.  There will be wait times while you are here for treatment.  It will take about 30 minutes to 1 hour for your lab work to result.  Then there will be wait times while pharmacy mixes your medications.   Medications you will receive in the clinic prior to your chemotherapy medications:  Aloxi:  ALOXI is used in adults to help prevent the nausea and vomiting that happens with certain anti-cancer medicines (chemotherapy).  Aloxi is a long acting medication, and will remain in your system for 24-36 hours.   Emend:  This is an anti-nausea medication that is used with Aloxi to help prevent nausea and vomiting caused by chemotherapy.  Dexamethasone:  This is a steroid given prior to chemotherapy to help prevent allergic reactions; it may also help prevent and control nausea and diarrhea.    CISPLATIN  About This Drug Cisplatin is a drug used to treat cancer. This drug is given in the vein (IV).  This will take 1 hour to infuse.  With this drug you will receive 2 hours of IV hydration prior to administration and 2 hours of IV hydration after administration.  This is to help protect your kidneys.  You will have to urinate 200 mL prior to receiving this medication.  We will give you something to measure your urine in. You will receive this medication in our clinic weekly.   Possible Side Effects (More Common) . This drug may affect how your kidneys work. Your kidney function will be checked as needed. .  Electrolyte changes. Your blood will be checked for electrolyte changes as needed. . High-frequency hearing loss may occur. You will get IV fluids before and during the Cisplatin infusion to help prevent this. You may also get ringing in the ears. . Bone marrow depression. This is a decrease in the number of white blood cells, red blood cells, and platelets. This may raise your risk of infection, make you tired and weak (fatigue), and raise your risk of bleeding. . Nausea and throwing up (vomiting). These symptoms may happen within a few hours after your treatment and may last for a few days to a week. Medicines are available to stop or lessen these side effects.  Possible Side Effects (Less Common) . Effects on the nerves are called peripheral neuropathy. You may feel numbness or pain in your hands and feet. It may be hard for you to button your clothes, open jars, or walk as usual. The effect on the nerves may get worse with more doses of the drug. These effects get better in some people after the drug is stopped, but it does not get better in all people. Marland Kitchen Blurred vision or other changes in eyesight. . Soreness of the mouth and throat. You may have red areas, white patches, or sores that hurt. . Hair loss. You may notice your hair getting thin. Some patients lose their hair. Your hair often grows back when treatment is done.  Allergic Reactions Allergic reactions to  this drug are rare, but may happen in some patients. Signs of allergic reactions to this drug may be a rash, fever, chills, feeling dizzy, trouble breathing, and/or feeling that your heart is beating in a fast or not normal way.  Treating Side Effects . Drink 6-8 cups of fluids each day unless your doctor has told you to limit your fluid intake due to some other health problem. A cup is 8 ounces of fluid. If you throw up or have loose bowel movements you should drink more fluids so that you do not become dehydrated (lack water in the  body due to losing too much fluid). . If you have numbness and tingling in your hands and feet, be careful when cooking, walking, and handling sharp objects and hot liquids. . Mouth care is very important. Your mouth care should consist of routine, gentle cleaning of your teeth or dentures and rinsing your mouth with a mixture of 1/2 teaspoon of salt in 8 ounces of water or  teaspoon of baking soda in 8 ounces of water. This should be done at least after each meal and at bedtime. . If you have mouth sores, avoid mouthwash that has alcohol. Also avoid alcohol and smoking because they can bother your mouth and throat. . Talk with your nurse about getting a wig before you lose your hair. Also, call the [facility] at 800-ACS-2345 to find out information about the "Look Good, Feel Better" program close to where you live. It is a free program where women getting chemotherapy can learn about wigs, turbans and scarves as well as makeup techniques and skin and nail care.  Food and Drug Interactions  There are no known interactions of Cisplatin with food. This drug may interact with other medicines. Tell your doctor and pharmacist about all the medicines and dietary supplements (vitamins, minerals, herbs and others) that you are taking at this time. The safety and use of dietary supplements and alternative diets are often not known. Using these might affect your cancer or interfere with your treatment. Until more is known, you should not use dietary supplements or alternative diets without your cancer doctor's help.  When to Call the Doctor  Call your doctor or nurse right away if you have any of these symptoms: . Rash or itching . Feeling dizzy or lightheaded . Wheezing or trouble breathing . Swelling of the face . Fever of 100.5 F (38 C) or above . Chills . Easy bleeding or bruising . Decreased urine . Weight gain of 5 pounds in one week (fluid retention) . Nausea that stops you from  eating or drinking . Throwing up more than 3 times a day  Call your doctor or nurse as soon as possible if you have these symptoms: . Numbness, tingling, decreased feeling or weakness in fingers, toes, arms, or legs . Trouble walking or changes in the way you walk, feeling clumsy when buttoning clothes, opening jars, or other routine hand motions . Blurred vision or other changes in eyesight . Changes in hearing, ringing in the ears . Pain in your mouth or throat that makes it hard to eat or drink . Fatigue that interferes with your daily activities  Sexual Problems and Reproductive Concerns  . Infertility warning: Sexual problems and reproduction concerns may occur. In both men and women, this drug may affect your ability to have children. This cannot be determined before your treatment. Speak with your doctor or nurse if you plan to have children. Ask  for information on sperm or egg banking. . In men, this drug may interfere with your ability to make sperm, but it should not change your ability to have sexual relations. . In women, menstrual bleeding may become irregular or stop while you are receiving this drug. Do not assume that you cannot become pregnant if you do not have a menstrual period. . Women may experience signs of menopause like vaginal dryness, itching, and pain during sexual relations . Genetic counseling is available for you to talk about the effects of this drug therapy on future pregnancies. Also, a genetic counselor can look at the possible risk of problems in the unborn baby due to this medicine if an exposure happens during pregnancy.  SELF CARE ACTIVITIES WHILE ON CHEMOTHERAPY:  Hydration Increase your fluid intake 48 hours prior to treatment and drink at least 8 to 12 cups (64 ounces) of water/decaffeinated beverages per day after treatment. You can still have your cup of coffee or soda but these beverages do not count as part of your 8 to 12 cups that you need to  drink daily. No alcohol intake.  Medications Continue taking your normal prescription medication as prescribed.  If you start any new herbal or new supplements please let us know first to make sure it is safe.  Mouth Care Have teeth cleaned professionally before starting treatment. Keep dentures and partial plates clean. Use soft toothbrush and do not use mouthwashes that contain alcohol. Biotene is a good mouthwash that is available at most pharmacies or may be ordered by calling [phone]. Use warm salt water gargles (1 teaspoon salt per 1 quart warm water) before and after meals and at bedtime. Or you may rinse with 2 tablespoons of three-percent hydrogen peroxide mixed in eight ounces of water. If you are still having problems with your mouth or sores in your mouth please call the clinic. If you need dental work, please let the doctor know before you go for your appointment so that we can coordinate the best possible time for you in regards to your chemo regimen. You need to also let your dentist know that you are actively taking chemo. We may need to do labs prior to your dental appointment.  Skin Care Always use sunscreen that has not expired and with SPF (Sun Protection Factor) of 50 or higher. Wear hats to protect your head from the sun. Remember to use sunscreen on your hands, ears, face, & feet.  Use good moisturizing lotions such as udder cream, eucerin, or even Vaseline. Some chemotherapies can cause dry skin, color changes in your skin and nails.    . Avoid long, hot showers or baths. . Use gentle, fragrance-free soaps and laundry detergent. . Use moisturizers, preferably creams or ointments rather than lotions because the thicker consistency is better at preventing skin dehydration. Apply the cream or ointment within 15 minutes of showering. Reapply moisturizer at night, and moisturize your hands every time after you wash them.  Hair Loss (if your doctor says your hair will fall  out)  . If your doctor says that your hair is likely to fall out, decide before you begin chemo whether you want to wear a wig. You may want to shop before treatment to match your hair color. . Hats, turbans, and scarves can also camouflage hair loss, although some people prefer to leave their heads uncovered. If you go bare-headed outdoors, be sure to use sunscreen on your scalp. . Cut your hair short. It  eases the inconvenience of shedding lots of hair, but it also can reduce the emotional impact of watching your hair fall out. . Don't perm or color your hair during chemotherapy. Those chemical treatments are already damaging to hair and can enhance hair loss. Once your chemo treatments are done and your hair has grown back, it's OK to resume dyeing or perming hair.  With chemotherapy, hair loss is almost always temporary. But when it grows back, it may be a different color or texture. In older adults who still had hair color before chemotherapy, the new growth may be completely gray.  Often, new hair is very fine and soft.  Infection Prevention Please wash your hands for at least 30 seconds using warm soapy water. Handwashing is the #1 way to prevent the spread of germs. Stay away from sick people or people who are getting over a cold. If you develop respiratory systems such as green/yellow mucus production or productive cough or persistent cough let us know and we will see if you need an antibiotic. It is a good idea to keep a pair of gloves on when going into grocery stores/[employer] to decrease your risk of coming into contact with germs on the carts, etc. Carry alcohol hand gel with you at all times and use it frequently if out in public. If your temperature reaches 100.5 or higher please call the clinic and let us know.  If it is after hours or on the weekend please go to the ER if your temperature is over 100.5.  Please have your own personal thermometer at home to use.    Sex and bodily  fluids If you are going to have sex, a condom must be used to protect the person that isn't taking chemotherapy. Chemo can decrease your libido (sex drive). For a few days after chemotherapy, chemotherapy can be excreted through your bodily fluids.  When using the toilet please close the lid and flush the toilet twice.  Do this for a few day after you have had chemotherapy.   Effects of chemotherapy on your sex life Some changes are simple and won't last long. They won't affect your sex life permanently.  Sometimes you may feel: . too tired . not strong enough to be very active . sick or sore  . not in the mood . anxious or low Your anxiety might not seem related to sex. For example, you may be worried about the cancer and how your treatment is going. Or you may be worried about money, or about how you family are coping with your illness. These things can cause stress, which can affect your interest in sex. It's important to talk to your partner about how you feel. Remember - the changes to your sex life don't usually last long. There's usually no medical reason to stop having sex during chemo. The drugs won't have any long term physical effects on your performance or enjoyment of sex. Cancer can't be passed on to your partner during sex  Contraception It's important to use reliable contraception during treatment. Avoid getting pregnant while you or your partner are having chemotherapy. This is because the drugs may harm the baby. Sometimes chemotherapy drugs can leave a man or woman infertile.  This means you would not be able to have children in the future. You might want to talk to someone about permanent infertility. It can be very difficult to learn that you may no longer be able to have children. Some people find  counselling helpful. There might be ways to preserve your fertility, although this is easier for men than for women. You may want to speak to a fertility expert. You can talk about  sperm banking or harvesting your eggs. You can also ask about other fertility options, such as donor eggs. If you have or have had breast cancer, your doctor might advise you not to take the contraceptive pill. This is because the hormones in it might affect the cancer.  It is not known for sure whether or not chemotherapy drugs can be passed on through semen or secretions from the vagina. Because of this some doctors advise people to use a barrier method if you have sex during treatment. This applies to vaginal, anal or oral sex. Generally, doctors advise a barrier method only for the time you are actually having the treatment and for about a week after your treatment. Advice like this can be worrying, but this does not mean that you have to avoid being intimate with your partner. You can still have close contact with your partner and continue to enjoy sex.  Animals If you have cats or birds we just ask that you not change the litter or change the cage.  Please have someone else do this for you while you are on chemotherapy.   Food Safety During and After Cancer Treatment Food safety is important for people both during and after cancer treatment. Cancer and cancer treatments, such as chemotherapy, radiation therapy, and stem cell/bone marrow transplantation, often weaken the immune system. This makes it harder for your body to protect itself from foodborne illness, also called food poisoning. Foodborne illness is caused by eating food that contains harmful bacteria, parasites, or viruses.  Foods to avoid Some foods have a higher risk of becoming tainted with bacteria. These include: Marland Kitchen Unwashed fresh fruit and vegetables, especially leafy vegetables that can hide dirt and other contaminants . Raw sprouts, such as alfalfa sprouts . Raw or undercooked beef, especially ground beef, or other raw or undercooked meat and poultry . Fatty, fried, or spicy foods immediately before or after treatment.  These  can sit heavy on your stomach and make you feel nauseous. . Raw or undercooked shellfish, such as oysters. . Sushi and sashimi, which often contain raw fish.  . Unpasteurized beverages, such as unpasteurized fruit juices, raw milk, raw yogurt, or cider . Undercooked eggs, such as soft boiled, over easy, and poached; raw, unpasteurized eggs; or foods made with raw egg, such as homemade raw cookie dough and homemade mayonnaise  Simple steps for food safety  Shop smart. . Do not buy food stored or displayed in an unclean area. . Do not buy bruised or damaged fruits or vegetables. . Do not buy cans that have cracks, dents, or bulges. . Pick up foods that can spoil at the end of your shopping trip and store them in a cooler on the way home.  Prepare and clean up foods carefully. . Rinse all fresh fruits and vegetables under running water, and dry them with a clean towel or paper towel. . Clean the top of cans before opening them. . After preparing food, wash your hands for 20 seconds with hot water and soap. Pay special attention to areas between fingers and under nails. . Clean your utensils and dishes with hot water and soap. Marland Kitchen Disinfect your kitchen and cutting boards using 1 teaspoon of liquid, unscented bleach mixed into 1 quart of water.    Dispose of  old food. . Eat canned and packaged food before its expiration date (the "use by" or "best before" date). . Consume refrigerated leftovers within 3 to 4 days. After that time, throw out the food. Even if the food does not smell or look spoiled, it still may be unsafe. Some bacteria, such as Listeria, can grow even on foods stored in the refrigerator if they are kept for too long.  Take precautions when eating out. . At restaurants, avoid buffets and salad bars where food sits out for a long time and comes in contact with many people. Food can become contaminated when someone with a virus, often a norovirus, or another "bug" handles it. . Put  any leftover food in a "to-go" container yourself, rather than having the server do it. And, refrigerate leftovers as soon as you get home. . Choose restaurants that are clean and that are willing to prepare your food as you order it cooked.   MEDICATIONS:                                                                                                                                                                Compazine/Prochlorperazine 10mg  tablet. Take 1 tablet every 6 hours as needed for nausea/vomiting. (This can make you sleepy)   EMLA cream. Apply a quarter size amount to port site 1 hour prior to chemo. Do not rub in. Cover with plastic wrap.   Over-the-Counter Meds:  Colace - 100 mg capsules - take 2 capsules daily.  If this doesn't help then you can increase to 2 capsules twice daily.  Call us if this does not help your bowels move.   Imodium 2mg  capsule. Take 2 capsules after the 1st loose stool and then 1 capsule every 2 hours until you go a total of 12 hours without having a loose stool. Call the [facility] if loose stools continue. If diarrhea occurs at bedtime, take 2 capsules at bedtime. Then take 2 capsules every 4 hours until morning. Call [facility].    Diarrhea Sheet   If you are having loose stools/diarrhea, please purchase Imodium and begin taking as outlined:  At the first sign of poorly formed or loose stools you should begin taking Imodium (loperamide) 2 mg capsules.  Take two tablets (4mg ) followed by one tablet (2mg ) every 2 hours - DO NOT EXCEED 8 tablets in 24 hours.  If it is bedtime and you are having loose stools, take 2 tablets at bedtime, then 2 tablets every 4 hours until morning.   Always call the [facility] if you are having loose stools/diarrhea that you can't get under control.  Loose stools/diarrhea leads to dehydration (loss of water) in your body.  We have other options of trying to get the loose  stools/diarrhea to stop but you must let  us know!   Constipation Sheet  Colace - 100 mg capsules - take 2 capsules daily.  If this doesn't help then you can increase to 2 capsules twice daily.  Please call if the above does not work for you.   Do not go more than 2 days without a bowel movement.  It is very important that you do not become constipated.  It will make you feel sick to your stomach (nausea) and can cause abdominal pain and vomiting.   Nausea Sheet   Compazine/Prochlorperazine 10mg  tablet. Take 1 tablet every 6 hours as needed for nausea/vomiting. (This can make you sleepy)  If you are having persistent nausea (nausea that does not stop) please call the [facility] and let us know the amount of nausea that you are experiencing.  If you begin to vomit, you need to call the [facility] and if it is the weekend and you have vomited more than one time and can't get it to stop-go to the Emergency Room.  Persistent nausea/vomiting can lead to dehydration (loss of fluid in your body) and will make you feel terrible.   Ice chips, sips of clear liquids, foods that are @ room temperature, crackers, and toast tend to be better tolerated.   SYMPTOMS TO REPORT AS SOON AS POSSIBLE AFTER TREATMENT:   FEVER GREATER THAN 100.5 F  CHILLS WITH OR WITHOUT FEVER  NAUSEA AND VOMITING THAT IS NOT CONTROLLED WITH YOUR NAUSEA MEDICATION  UNUSUAL SHORTNESS OF BREATH  UNUSUAL BRUISING OR BLEEDING  TENDERNESS IN MOUTH AND THROAT WITH OR WITHOUT PRESENCE OF ULCERS  URINARY PROBLEMS  BOWEL PROBLEMS  UNUSUAL RASH      Wear comfortable clothing and clothing appropriate for easy access to any Portacath or PICC line. Let us know if there is anything that we can do to make your therapy better!    What to do if you need assistance after hours or on the weekends: CALL [phone].  HOLD on the line, do not hang up.  You will hear multiple messages but at the end you will be connected with a nurse triage line.  They will  contact the doctor if necessary.  Most of the time they will be able to assist you.  Do not call the hospital operator.      I have been informed and understand all of the instructions given to me and have received a copy. I have been instructed to call the clinic [phone] or my family physician as soon as possible for continued medical care, if indicated. I do not have any more questions at this time but understand that I may call the [facility] or the Patient Navigator at [phone] during office hours should I have questions or need assistance in obtaining follow-up care.

## 2019-06-09 DIAGNOSIS — C049 Malignant neoplasm of floor of mouth, unspecified: Secondary | ICD-10-CM | POA: Diagnosis not present

## 2019-06-09 DIAGNOSIS — C040 Malignant neoplasm of anterior floor of mouth: Secondary | ICD-10-CM | POA: Diagnosis not present

## 2019-06-13 DIAGNOSIS — C040 Malignant neoplasm of anterior floor of mouth: Secondary | ICD-10-CM | POA: Diagnosis not present

## 2019-06-13 DIAGNOSIS — C049 Malignant neoplasm of floor of mouth, unspecified: Secondary | ICD-10-CM | POA: Diagnosis not present

## 2019-06-14 ENCOUNTER — Other Ambulatory Visit: Payer: Self-pay

## 2019-06-14 ENCOUNTER — Inpatient Hospital Stay (HOSPITAL_COMMUNITY): Payer: Medicare Other | Attending: Hematology

## 2019-06-14 DIAGNOSIS — K1230 Oral mucositis (ulcerative), unspecified: Secondary | ICD-10-CM | POA: Insufficient documentation

## 2019-06-14 DIAGNOSIS — C049 Malignant neoplasm of floor of mouth, unspecified: Secondary | ICD-10-CM | POA: Insufficient documentation

## 2019-06-14 DIAGNOSIS — C3412 Malignant neoplasm of upper lobe, left bronchus or lung: Secondary | ICD-10-CM | POA: Insufficient documentation

## 2019-06-14 DIAGNOSIS — Z5111 Encounter for antineoplastic chemotherapy: Secondary | ICD-10-CM | POA: Diagnosis not present

## 2019-06-14 LAB — CBC WITH DIFFERENTIAL/PLATELET
Abs Immature Granulocytes: 0.03 10*3/uL (ref 0.00–0.07)
Basophils Absolute: 0 10*3/uL (ref 0.0–0.1)
Basophils Relative: 1 %
Eosinophils Absolute: 0.4 10*3/uL (ref 0.0–0.5)
Eosinophils Relative: 5 %
HCT: 48.5 % (ref 39.0–52.0)
Hemoglobin: 15 g/dL (ref 13.0–17.0)
Immature Granulocytes: 0 %
Lymphocytes Relative: 25 %
Lymphs Abs: 1.9 10*3/uL (ref 0.7–4.0)
MCH: 28 pg (ref 26.0–34.0)
MCHC: 30.9 g/dL (ref 30.0–36.0)
MCV: 90.7 fL (ref 80.0–100.0)
Monocytes Absolute: 0.8 10*3/uL (ref 0.1–1.0)
Monocytes Relative: 10 %
Neutro Abs: 4.6 10*3/uL (ref 1.7–7.7)
Neutrophils Relative %: 59 %
Platelets: 275 10*3/uL (ref 150–400)
RBC: 5.35 MIL/uL (ref 4.22–5.81)
RDW: 16.5 % — ABNORMAL HIGH (ref 11.5–15.5)
WBC: 7.7 10*3/uL (ref 4.0–10.5)
nRBC: 0 % (ref 0.0–0.2)

## 2019-06-14 LAB — COMPREHENSIVE METABOLIC PANEL
ALT: 13 U/L (ref 0–44)
AST: 17 U/L (ref 15–41)
Albumin: 4.2 g/dL (ref 3.5–5.0)
Alkaline Phosphatase: 97 U/L (ref 38–126)
Anion gap: 9 (ref 5–15)
BUN: 10 mg/dL (ref 8–23)
CO2: 27 mmol/L (ref 22–32)
Calcium: 9.6 mg/dL (ref 8.9–10.3)
Chloride: 105 mmol/L (ref 98–111)
Creatinine, Ser: 0.69 mg/dL (ref 0.61–1.24)
GFR calc Af Amer: >60 mL/min (ref >60)
GFR calc non Af Amer: >60 mL/min (ref >60)
Glucose, Bld: 130 mg/dL — ABNORMAL HIGH (ref 70–99)
Potassium: 4.2 mmol/L (ref 3.5–5.1)
Sodium: 141 mmol/L (ref 135–145)
Total Bilirubin: 0.3 mg/dL (ref 0.3–1.2)
Total Protein: 7 g/dL (ref 6.5–8.1)

## 2019-06-14 LAB — LACTATE DEHYDROGENASE: LDH: 106 U/L (ref 98–192)

## 2019-06-15 ENCOUNTER — Inpatient Hospital Stay (HOSPITAL_COMMUNITY): Payer: Medicare Other

## 2019-06-15 ENCOUNTER — Inpatient Hospital Stay (HOSPITAL_BASED_OUTPATIENT_CLINIC_OR_DEPARTMENT_OTHER): Payer: Medicare Other | Admitting: Hematology

## 2019-06-15 ENCOUNTER — Encounter (HOSPITAL_COMMUNITY): Payer: Self-pay | Admitting: Hematology

## 2019-06-15 VITALS — BP 142/83 | HR 63 | Temp 97.1°F | Resp 18 | Wt 160.6 lb

## 2019-06-15 DIAGNOSIS — C3412 Malignant neoplasm of upper lobe, left bronchus or lung: Secondary | ICD-10-CM | POA: Diagnosis not present

## 2019-06-15 DIAGNOSIS — C049 Malignant neoplasm of floor of mouth, unspecified: Secondary | ICD-10-CM | POA: Diagnosis not present

## 2019-06-15 DIAGNOSIS — Z5111 Encounter for antineoplastic chemotherapy: Secondary | ICD-10-CM | POA: Diagnosis not present

## 2019-06-15 DIAGNOSIS — K1230 Oral mucositis (ulcerative), unspecified: Secondary | ICD-10-CM | POA: Diagnosis not present

## 2019-06-15 DIAGNOSIS — Z95828 Presence of other vascular implants and grafts: Secondary | ICD-10-CM

## 2019-06-15 MED ORDER — POTASSIUM CHLORIDE 2 MEQ/ML IV SOLN
Freq: Once | INTRAVENOUS | Status: AC
  Administered 2019-06-15: 10:00:00 via INTRAVENOUS
  Filled 2019-06-15: qty 10

## 2019-06-15 MED ORDER — PALONOSETRON HCL INJECTION 0.25 MG/5ML
0.2500 mg | Freq: Once | INTRAVENOUS | Status: AC
  Administered 2019-06-15: 0.25 mg via INTRAVENOUS
  Filled 2019-06-15: qty 5

## 2019-06-15 MED ORDER — SODIUM CHLORIDE 0.9 % IV SOLN
40.0000 mg/m2 | Freq: Once | INTRAVENOUS | Status: AC
  Administered 2019-06-15: 78 mg via INTRAVENOUS
  Filled 2019-06-15: qty 78

## 2019-06-15 MED ORDER — HEPARIN SOD (PORK) LOCK FLUSH 100 UNIT/ML IV SOLN
500.0000 [IU] | Freq: Once | INTRAVENOUS | Status: AC | PRN
  Administered 2019-06-15: 500 [IU]

## 2019-06-15 MED ORDER — SODIUM CHLORIDE 0.9 % IV SOLN
Freq: Once | INTRAVENOUS | Status: AC
  Administered 2019-06-15: 10:00:00 via INTRAVENOUS

## 2019-06-15 MED ORDER — SODIUM CHLORIDE 0.9% FLUSH
10.0000 mL | INTRAVENOUS | Status: DC | PRN
  Administered 2019-06-15: 10 mL
  Filled 2019-06-15: qty 10

## 2019-06-15 MED ORDER — SODIUM CHLORIDE 0.9 % IV SOLN
Freq: Once | INTRAVENOUS | Status: AC
  Administered 2019-06-15: 11:00:00 via INTRAVENOUS
  Filled 2019-06-15: qty 5

## 2019-06-15 MED ORDER — SODIUM CHLORIDE 0.9 % IV SOLN
Freq: Once | INTRAVENOUS | Status: AC
  Administered 2019-06-15: 13:00:00 via INTRAVENOUS
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
## 2019-06-15 NOTE — Patient Instructions (Addendum)
[facility] at [facility] Discharge Instructions  You were seen today by Dr. Katragadda. He went over your recent lab results. He will see you back in 1 week for labs and follow up.   Thank you for choosing [facility] at [facility] to provide your oncology and hematology care.  To afford each patient quality time with our provider, please arrive at least 15 minutes before your scheduled appointment time.   If you have a lab appointment with the Cancer Center please come in thru the  Main Entrance and check in at the main information desk  You need to re-schedule your appointment should you arrive 10 or more minutes late.  We strive to give you quality time with our providers, and arriving late affects you and other patients whose appointments are after yours.  Also, if you no show three or more times for appointments you may be dismissed from the clinic at the providers discretion.     Again, thank you for choosing [facility].  Our hope is that these requests will decrease the amount of time that you wait before being seen by our physicians.       _____________________________________________________________  Should you have questions after your visit to [facility], please contact our office at [phone] between the hours of 8:00 a.m. and 4:30 p.m.  Voicemails left after 4:00 p.m. will not be returned until the following business day.  For prescription refill requests, have your pharmacy contact our office and allow 72 hours.    Cancer Center Support Programs:   > Cancer Support Group  2nd Tuesday of the month 1pm-2pm, Journey Room    

## 2019-06-15 NOTE — Progress Notes (Signed)
Patient here for day 1 of treatment, patient went to radiation today and came to clinic after. Education done and consent obtained at bedside by RN.   Treatment given per orders. Patient tolerated it well without problems. Vitals stable and discharged home from clinic ambulatory. Follow up as scheduled.

## 2019-06-15 NOTE — Patient Instructions (Signed)
[facility] Discharge Instructions for Patients Receiving Chemotherapy  Today you received the following chemotherapy agents   To help prevent nausea and vomiting after your treatment, we encourage you to take your nausea medication   If you develop nausea and vomiting that is not controlled by your nausea medication, call the clinic.   BELOW ARE SYMPTOMS THAT SHOULD BE REPORTED IMMEDIATELY:  *FEVER GREATER THAN 100.5 F  *CHILLS WITH OR WITHOUT FEVER  NAUSEA AND VOMITING THAT IS NOT CONTROLLED WITH YOUR NAUSEA MEDICATION  *UNUSUAL SHORTNESS OF BREATH  *UNUSUAL BRUISING OR BLEEDING  TENDERNESS IN MOUTH AND THROAT WITH OR WITHOUT PRESENCE OF ULCERS  *URINARY PROBLEMS  *BOWEL PROBLEMS  UNUSUAL RASH Items with * indicate a potential emergency and should be followed up as soon as possible.  Feel free to call the clinic should you have any questions or concerns. The clinic phone number is [phone].  Please show the CHEMO ALERT CARD at check-in to the Emergency Department and triage nurse.   

## 2019-06-15 NOTE — Assessment & Plan Note (Signed)
1.  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 Verlan [facility] of [facility], pathology showing invasive squamous cell carcinoma, spindle cell carcinoma/carcinosarcoma variant, tumor measuring 2.9 cm, invades into the mandible, margins negative, no lymphovascular invasion, perineural invasion present.  Metastatic carcinoma involving 1 lymph node, largest focus 0.7 cm, no extranodal extension. -PET scan on 04/05/2019 shows 1.4 cm gastrohepatic lymph node SUV 4.5.  There is also 1 cm left upper lobe lung nodule with FDG uptake.  Cavitary left lung nodule is stable without hypermetabolism. - Because of adverse features like positive perineural invasion and aggressive histology, systemic therapy with RT was considered. - We talked about starting him on weekly cisplatin at 40 mg per metered square.  We talked about side effects including but not limited to ototoxicity, neurotoxicity, nephrotoxicity. -He started radiation therapy today.  He will proceed with his first cycle of chemotherapy.  We have reviewed his blood work. - We will bring him back tomorrow for aggressive hydration. -We will see him back in 1 week with repeat labs and treatment.  2.  Nutrition: - He takes in 4 cans of Osmolite 1.5 daily through PEG tube. -He is able to eat soft foods by mouth.  3.  Non-small cell carcinoma of the left upper lobe of the lung: -Navigational bronchoscopy and biopsy by Dr. Roxan Hockey on 05/16/2019.  This was consistent with non-small cell carcinoma. - He will be considered for SBRT upon completion of chemoradiation therapy for head and neck cancer.

## 2019-06-16 ENCOUNTER — Other Ambulatory Visit: Payer: Self-pay

## 2019-06-16 ENCOUNTER — Inpatient Hospital Stay (HOSPITAL_COMMUNITY): Payer: Medicare Other

## 2019-06-16 VITALS — BP 144/71 | HR 64 | Temp 97.1°F | Resp 18

## 2019-06-16 DIAGNOSIS — C109 Malignant neoplasm of oropharynx, unspecified: Secondary | ICD-10-CM

## 2019-06-16 DIAGNOSIS — Z5111 Encounter for antineoplastic chemotherapy: Secondary | ICD-10-CM | POA: Diagnosis not present

## 2019-06-16 DIAGNOSIS — C049 Malignant neoplasm of floor of mouth, unspecified: Secondary | ICD-10-CM | POA: Diagnosis not present

## 2019-06-16 DIAGNOSIS — C3412 Malignant neoplasm of upper lobe, left bronchus or lung: Secondary | ICD-10-CM | POA: Diagnosis not present

## 2019-06-16 DIAGNOSIS — K1230 Oral mucositis (ulcerative), unspecified: Secondary | ICD-10-CM | POA: Diagnosis not present

## 2019-06-16 MED ORDER — SODIUM CHLORIDE 0.9% FLUSH
10.0000 mL | Freq: Once | INTRAVENOUS | Status: AC | PRN
  Administered 2019-06-16: 10 mL

## 2019-06-16 MED ORDER — HEPARIN SOD (PORK) LOCK FLUSH 100 UNIT/ML IV SOLN
500.0000 [IU] | Freq: Once | INTRAVENOUS | Status: AC | PRN
  Administered 2019-06-16: 500 [IU]

## 2019-06-16 MED ORDER — SODIUM CHLORIDE 0.9 % IV SOLN
Freq: Once | INTRAVENOUS | Status: AC
  Administered 2019-06-16: 14:00:00 via INTRAVENOUS
  Filled 2019-06-16: qty 1000

## 2019-06-16 NOTE — Progress Notes (Signed)
Hydration fluids given today per orders. Patient tolerated it well without problems. Vitals stable and discharged home from clinic ambulatory. Follow up as scheduled.

## 2019-06-17 ENCOUNTER — Inpatient Hospital Stay (HOSPITAL_COMMUNITY): Payer: Medicare Other

## 2019-06-17 VITALS — BP 140/68 | HR 70 | Temp 98.3°F | Resp 16

## 2019-06-17 DIAGNOSIS — C3412 Malignant neoplasm of upper lobe, left bronchus or lung: Secondary | ICD-10-CM | POA: Diagnosis not present

## 2019-06-17 DIAGNOSIS — K1230 Oral mucositis (ulcerative), unspecified: Secondary | ICD-10-CM | POA: Diagnosis not present

## 2019-06-17 DIAGNOSIS — C109 Malignant neoplasm of oropharynx, unspecified: Secondary | ICD-10-CM

## 2019-06-17 DIAGNOSIS — Z5111 Encounter for antineoplastic chemotherapy: Secondary | ICD-10-CM | POA: Diagnosis not present

## 2019-06-17 DIAGNOSIS — C049 Malignant neoplasm of floor of mouth, unspecified: Secondary | ICD-10-CM | POA: Diagnosis not present

## 2019-06-17 MED ORDER — HEPARIN SOD (PORK) LOCK FLUSH 100 UNIT/ML IV SOLN
500.0000 [IU] | Freq: Once | INTRAVENOUS | Status: AC | PRN
  Administered 2019-06-17: 500 [IU]

## 2019-06-17 MED ORDER — SODIUM CHLORIDE 0.9 % IV SOLN
Freq: Once | INTRAVENOUS | Status: AC
  Administered 2019-06-17: 10:00:00 via INTRAVENOUS
  Filled 2019-06-17: qty 1000

## 2019-06-17 MED ORDER — SODIUM CHLORIDE 0.9% FLUSH
10.0000 mL | Freq: Once | INTRAVENOUS | Status: AC | PRN
  Administered 2019-06-17: 10 mL

## 2019-06-17 NOTE — Progress Notes (Signed)
Pt presents today for IV fluids. VSS. Pt has no complaints of any pain or changes since the last visit.   Hydration fluids given today per MD orders. Tolerated infusion without adverse affects. Vital signs stable. No complaints at this time. Discharged from clinic ambulatory. F/U with [facility] as scheduled.

## 2019-06-17 NOTE — Patient Instructions (Signed)
[facility] at [facility]  Discharge Instructions:   _______________________________________________________________  Thank you for choosing [facility] at [facility] to provide your oncology and hematology care.  To afford each patient quality time with our providers, please arrive at least 15 minutes before your scheduled appointment.  You need to re-schedule your appointment if you arrive 10 or more minutes late.  We strive to give you quality time with our providers, and arriving late affects you and other patients whose appointments are after yours.  Also, if you no show three or more times for appointments you may be dismissed from the clinic.  Again, thank you for choosing [facility] at [facility]. Our hope is that these requests will allow you access to exceptional care and in a timely manner. _______________________________________________________________  If you have questions after your visit, please contact our office at [phone] between the hours of 8:30 a.m. and 5:00 p.m. Voicemails left after 4:30 p.m. will not be returned until the following business day. _______________________________________________________________  For prescription refill requests, have your pharmacy contact our office. _______________________________________________________________  Recommendations made by the consultant and any test results will be sent to your referring physician. _______________________________________________________________ 

## 2019-06-20 ENCOUNTER — Telehealth (HOSPITAL_COMMUNITY): Payer: Self-pay

## 2019-06-20 DIAGNOSIS — C049 Malignant neoplasm of floor of mouth, unspecified: Secondary | ICD-10-CM | POA: Diagnosis not present

## 2019-06-20 NOTE — Progress Notes (Signed)
Late entry:   Patient was educated on date of service 06/15/2019 for chemotherapy.  Chemotherapy education packet given and discussed with pt in detail.  Discussed diagnosis and staging, and tx regimen.  Reviewed chemotherapy medications and side effects, as well as pre-medications.  Instructed on how to manage side effects at home, and when to call the clinic.  Importance of fever/chills discussed with pt. Discussed precautions to implement at home after receiving tx, as well as self care strategies. Phone numbers provided for clinic during regular working hours, also how to reach the clinic after hours and on weekends. Pt provided the opportunity to ask questions - all questions answered to pt's satisfaction.

## 2019-06-20 NOTE — Telephone Encounter (Signed)
24 hr Chemo F/U call : Pt not at home but spoke with his wife who said he was fatigued over the weekend but otherwise did well. Encouraged pt's wife to let him know we called and to call us for any questions or problems with understanding verbalized

## 2019-06-21 ENCOUNTER — Other Ambulatory Visit: Payer: Self-pay

## 2019-06-21 ENCOUNTER — Inpatient Hospital Stay (HOSPITAL_COMMUNITY): Payer: Medicare Other

## 2019-06-21 ENCOUNTER — Other Ambulatory Visit (HOSPITAL_COMMUNITY): Payer: Medicare Other

## 2019-06-21 DIAGNOSIS — Z5111 Encounter for antineoplastic chemotherapy: Secondary | ICD-10-CM | POA: Diagnosis not present

## 2019-06-21 DIAGNOSIS — Z95828 Presence of other vascular implants and grafts: Secondary | ICD-10-CM

## 2019-06-21 DIAGNOSIS — G894 Chronic pain syndrome: Secondary | ICD-10-CM | POA: Diagnosis not present

## 2019-06-21 DIAGNOSIS — C049 Malignant neoplasm of floor of mouth, unspecified: Secondary | ICD-10-CM | POA: Diagnosis not present

## 2019-06-21 DIAGNOSIS — C3412 Malignant neoplasm of upper lobe, left bronchus or lung: Secondary | ICD-10-CM | POA: Diagnosis not present

## 2019-06-21 DIAGNOSIS — K1230 Oral mucositis (ulcerative), unspecified: Secondary | ICD-10-CM | POA: Diagnosis not present

## 2019-06-21 DIAGNOSIS — C040 Malignant neoplasm of anterior floor of mouth: Secondary | ICD-10-CM | POA: Diagnosis not present

## 2019-06-21 LAB — CBC WITH DIFFERENTIAL/PLATELET
Abs Immature Granulocytes: 0.03 10*3/uL (ref 0.00–0.07)
Basophils Absolute: 0 10*3/uL (ref 0.0–0.1)
Basophils Relative: 0 %
Eosinophils Absolute: 0.1 10*3/uL (ref 0.0–0.5)
Eosinophils Relative: 1 %
HCT: 46.3 % (ref 39.0–52.0)
Hemoglobin: 14.6 g/dL (ref 13.0–17.0)
Immature Granulocytes: 0 %
Lymphocytes Relative: 14 %
Lymphs Abs: 1.2 10*3/uL (ref 0.7–4.0)
MCH: 28.5 pg (ref 26.0–34.0)
MCHC: 31.5 g/dL (ref 30.0–36.0)
MCV: 90.4 fL (ref 80.0–100.0)
Monocytes Absolute: 0.8 10*3/uL (ref 0.1–1.0)
Monocytes Relative: 9 %
Neutro Abs: 6.3 10*3/uL (ref 1.7–7.7)
Neutrophils Relative %: 76 %
Platelets: 272 10*3/uL (ref 150–400)
RBC: 5.12 MIL/uL (ref 4.22–5.81)
RDW: 15.9 % — ABNORMAL HIGH (ref 11.5–15.5)
WBC: 8.3 10*3/uL (ref 4.0–10.5)
nRBC: 0 % (ref 0.0–0.2)

## 2019-06-21 LAB — BASIC METABOLIC PANEL
Anion gap: 7 (ref 5–15)
BUN: 13 mg/dL (ref 8–23)
CO2: 25 mmol/L (ref 22–32)
Calcium: 9.3 mg/dL (ref 8.9–10.3)
Chloride: 101 mmol/L (ref 98–111)
Creatinine, Ser: 0.69 mg/dL (ref 0.61–1.24)
GFR calc Af Amer: >60 mL/min (ref >60)
GFR calc non Af Amer: >60 mL/min (ref >60)
Glucose, Bld: 177 mg/dL — ABNORMAL HIGH (ref 70–99)
Potassium: 4 mmol/L (ref 3.5–5.1)
Sodium: 133 mmol/L — ABNORMAL LOW (ref 135–145)

## 2019-06-22 ENCOUNTER — Inpatient Hospital Stay (HOSPITAL_COMMUNITY): Payer: Medicare Other

## 2019-06-22 ENCOUNTER — Inpatient Hospital Stay (HOSPITAL_BASED_OUTPATIENT_CLINIC_OR_DEPARTMENT_OTHER): Payer: Medicare Other | Admitting: Hematology

## 2019-06-22 ENCOUNTER — Encounter (HOSPITAL_COMMUNITY): Payer: Self-pay | Admitting: Hematology

## 2019-06-22 VITALS — BP 146/67 | HR 58 | Temp 97.6°F | Resp 18

## 2019-06-22 VITALS — Wt 158.6 lb

## 2019-06-22 DIAGNOSIS — C3412 Malignant neoplasm of upper lobe, left bronchus or lung: Secondary | ICD-10-CM | POA: Diagnosis not present

## 2019-06-22 DIAGNOSIS — C109 Malignant neoplasm of oropharynx, unspecified: Secondary | ICD-10-CM

## 2019-06-22 DIAGNOSIS — K1230 Oral mucositis (ulcerative), unspecified: Secondary | ICD-10-CM | POA: Diagnosis not present

## 2019-06-22 DIAGNOSIS — Z5111 Encounter for antineoplastic chemotherapy: Secondary | ICD-10-CM | POA: Diagnosis not present

## 2019-06-22 DIAGNOSIS — C049 Malignant neoplasm of floor of mouth, unspecified: Secondary | ICD-10-CM

## 2019-06-22 DIAGNOSIS — Z95828 Presence of other vascular implants and grafts: Secondary | ICD-10-CM

## 2019-06-22 LAB — HEPATIC FUNCTION PANEL
ALT: 18 U/L (ref 0–44)
AST: 19 U/L (ref 15–41)
Albumin: 4 g/dL (ref 3.5–5.0)
Alkaline Phosphatase: 92 U/L (ref 38–126)
Bilirubin, Direct: 0.1 mg/dL (ref 0.0–0.2)
Indirect Bilirubin: 0.3 mg/dL (ref 0.3–0.9)
Total Bilirubin: 0.4 mg/dL (ref 0.3–1.2)
Total Protein: 6.3 g/dL — ABNORMAL LOW (ref 6.5–8.1)

## 2019-06-22 MED ORDER — POTASSIUM CHLORIDE 2 MEQ/ML IV SOLN
Freq: Once | INTRAVENOUS | Status: AC
  Administered 2019-06-22: 11:00:00 via INTRAVENOUS
  Filled 2019-06-22: qty 10

## 2019-06-22 MED ORDER — SODIUM CHLORIDE 0.9 % IV SOLN
40.0000 mg/m2 | Freq: Once | INTRAVENOUS | Status: AC
  Administered 2019-06-22: 78 mg via INTRAVENOUS
  Filled 2019-06-22: qty 78

## 2019-06-22 MED ORDER — SODIUM CHLORIDE 0.9 % IV SOLN
Freq: Once | INTRAVENOUS | Status: AC
  Administered 2019-06-22: 13:00:00 via INTRAVENOUS
  Filled 2019-06-22: qty 5

## 2019-06-22 MED ORDER — HEPARIN SOD (PORK) LOCK FLUSH 100 UNIT/ML IV SOLN
500.0000 [IU] | Freq: Once | INTRAVENOUS | Status: AC
  Administered 2019-06-22: 500 [IU] via INTRAVENOUS

## 2019-06-22 MED ORDER — SODIUM CHLORIDE 0.9 % IV SOLN
Freq: Once | INTRAVENOUS | Status: AC
  Administered 2019-06-22: 16:00:00 via INTRAVENOUS

## 2019-06-22 MED ORDER — SODIUM CHLORIDE 0.9 % IV SOLN
Freq: Once | INTRAVENOUS | Status: AC
  Administered 2019-06-22: 11:00:00 via INTRAVENOUS

## 2019-06-22 MED ORDER — PALONOSETRON HCL INJECTION 0.25 MG/5ML
0.2500 mg | Freq: Once | INTRAVENOUS | Status: AC
  Administered 2019-06-22: 0.25 mg via INTRAVENOUS
  Filled 2019-06-22: qty 5

## 2019-06-22 NOTE — Progress Notes (Signed)
        Pt voided 79mls    Treatment given today per MD orders. Per VO Dr. Delton Coombes. May send pt home with half (252ml) post Cisplatin 570ml hydration. Pt requesting to leave. Tolerated infusion without adverse affects. Vital signs stable. No complaints at this time. Discharged from clinic ambulatory. F/U with [facility] as scheduled.

## 2019-06-22 NOTE — Patient Instructions (Signed)
[facility] at [facility] Discharge Instructions  You were seen today by Dr. Katragadda. He went over your recent lab results. He will see you back in 1 week for labs and follow up.   Thank you for choosing [facility] at [facility] to provide your oncology and hematology care.  To afford each patient quality time with our provider, please arrive at least 15 minutes before your scheduled appointment time.   If you have a lab appointment with the Cancer Center please come in thru the  Main Entrance and check in at the main information desk  You need to re-schedule your appointment should you arrive 10 or more minutes late.  We strive to give you quality time with our providers, and arriving late affects you and other patients whose appointments are after yours.  Also, if you no show three or more times for appointments you may be dismissed from the clinic at the providers discretion.     Again, thank you for choosing [facility].  Our hope is that these requests will decrease the amount of time that you wait before being seen by our physicians.       _____________________________________________________________  Should you have questions after your visit to [facility], please contact our office at [phone] between the hours of 8:00 a.m. and 4:30 p.m.  Voicemails left after 4:00 p.m. will not be returned until the following business day.  For prescription refill requests, have your pharmacy contact our office and allow 72 hours.    Cancer Center Support Programs:   > Cancer Support Group  2nd Tuesday of the month 1pm-2pm, Journey Room    

## 2019-06-22 NOTE — Assessment & Plan Note (Signed)
1.  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 Verlan Friends of [facility], pathology showing invasive squamous cell carcinoma, spindle cell carcinoma/carcinosarcoma variant, tumor measuring 2.9 cm, invades into the mandible, margins negative, no lymphovascular invasion, perineural invasion present.  Metastatic carcinoma involving 1 lymph node, largest focus 0.7 cm, no extranodal extension. -PET scan on 04/05/2019 shows 1.4 cm gastrohepatic lymph node SUV 4.5.  There is also 1 cm left upper lobe lung nodule with FDG uptake.  Cavitary left lung nodule is stable without hypermetabolism. - Because of adverse features like positive perineural invasion and aggressive histology, systemic therapy with RT was recommended. - He was started on weekly cisplatin with radiation therapy on 06/15/2019. -He tolerated first cycle very well.  Did not have any nausea, vomiting, diarrhea.  He felt some soreness in the mouth starting 06/20/2019.  We have reviewed his labs.  Creatinine is within normal limits. -We will proceed with week 2 of cisplatin today.  We will reevaluate him in 1 week.  2.  Nutrition: -He is taking in 3 cans of Osmolite daily via PEG tube. -He is eating 3 meals a day.  Weight has been stable.  3.  Non-small cell carcinoma of the left upper lobe of the lung: -Navigational bronchoscopy and biopsy by Dr. Roxan Hockey on 05/16/2019.  This was consistent with non-small cell carcinoma. - He will be considered for SBRT upon completion of chemoradiation therapy for head and neck cancer.

## 2019-06-22 NOTE — Patient Instructions (Signed)
[facility] Discharge Instructions for Patients Receiving Chemotherapy  Today you received the following chemotherapy agents   To help prevent nausea and vomiting after your treatment, we encourage you to take your nausea medication   If you develop nausea and vomiting that is not controlled by your nausea medication, call the clinic.   BELOW ARE SYMPTOMS THAT SHOULD BE REPORTED IMMEDIATELY:  *FEVER GREATER THAN 100.5 F  *CHILLS WITH OR WITHOUT FEVER  NAUSEA AND VOMITING THAT IS NOT CONTROLLED WITH YOUR NAUSEA MEDICATION  *UNUSUAL SHORTNESS OF BREATH  *UNUSUAL BRUISING OR BLEEDING  TENDERNESS IN MOUTH AND THROAT WITH OR WITHOUT PRESENCE OF ULCERS  *URINARY PROBLEMS  *BOWEL PROBLEMS  UNUSUAL RASH Items with * indicate a potential emergency and should be followed up as soon as possible.  Feel free to call the clinic should you have any questions or concerns. The clinic phone number is [phone].  Please show the CHEMO ALERT CARD at check-in to the Emergency Department and triage nurse.   
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
## 2019-06-23 ENCOUNTER — Ambulatory Visit (HOSPITAL_COMMUNITY): Payer: Medicare Other

## 2019-06-24 ENCOUNTER — Other Ambulatory Visit (HOSPITAL_COMMUNITY): Payer: Self-pay | Admitting: Hematology

## 2019-06-24 ENCOUNTER — Encounter (HOSPITAL_COMMUNITY): Payer: Self-pay

## 2019-06-24 ENCOUNTER — Other Ambulatory Visit: Payer: Self-pay

## 2019-06-24 ENCOUNTER — Inpatient Hospital Stay (HOSPITAL_COMMUNITY): Payer: Medicare Other

## 2019-06-24 VITALS — BP 118/72 | HR 72 | Temp 97.7°F | Resp 18

## 2019-06-24 DIAGNOSIS — C3412 Malignant neoplasm of upper lobe, left bronchus or lung: Secondary | ICD-10-CM | POA: Diagnosis not present

## 2019-06-24 DIAGNOSIS — K1230 Oral mucositis (ulcerative), unspecified: Secondary | ICD-10-CM | POA: Diagnosis not present

## 2019-06-24 DIAGNOSIS — C109 Malignant neoplasm of oropharynx, unspecified: Secondary | ICD-10-CM

## 2019-06-24 DIAGNOSIS — C049 Malignant neoplasm of floor of mouth, unspecified: Secondary | ICD-10-CM | POA: Diagnosis not present

## 2019-06-24 DIAGNOSIS — Z5111 Encounter for antineoplastic chemotherapy: Secondary | ICD-10-CM | POA: Diagnosis not present

## 2019-06-24 MED ORDER — HEPARIN SOD (PORK) LOCK FLUSH 100 UNIT/ML IV SOLN
500.0000 [IU] | Freq: Once | INTRAVENOUS | Status: AC | PRN
  Administered 2019-06-24: 500 [IU]

## 2019-06-24 MED ORDER — SODIUM CHLORIDE 0.9 % IV SOLN
Freq: Once | INTRAVENOUS | Status: AC
  Administered 2019-06-24: 13:00:00 via INTRAVENOUS
  Filled 2019-06-24: qty 1000

## 2019-06-24 NOTE — Patient Instructions (Signed)
[facility] at [facility]  Discharge Instructions:   _______________________________________________________________  Thank you for choosing [facility] at [facility] to provide your oncology and hematology care.  To afford each patient quality time with our providers, please arrive at least 15 minutes before your scheduled appointment.  You need to re-schedule your appointment if you arrive 10 or more minutes late.  We strive to give you quality time with our providers, and arriving late affects you and other patients whose appointments are after yours.  Also, if you no show three or more times for appointments you may be dismissed from the clinic.  Again, thank you for choosing [facility] at [facility]. Our hope is that these requests will allow you access to exceptional care and in a timely manner. _______________________________________________________________  If you have questions after your visit, please contact our office at [phone] between the hours of 8:30 a.m. and 5:00 p.m. Voicemails left after 4:30 p.m. will not be returned until the following business day. _______________________________________________________________  For prescription refill requests, have your pharmacy contact our office. _______________________________________________________________  Recommendations made by the consultant and any test results will be sent to your referring physician. _______________________________________________________________ 

## 2019-06-24 NOTE — Progress Notes (Signed)
Patient to receive 1/2 bag of IVF due to labs looking good, pt's oral/liquid intake good, and per recommendation of Harriet Pho NP. Future orders changed to reflect 3meq of Potassium so 1 hour duration of IVF moving forward.

## 2019-06-27 ENCOUNTER — Inpatient Hospital Stay (HOSPITAL_COMMUNITY): Payer: Medicare Other

## 2019-06-27 ENCOUNTER — Other Ambulatory Visit: Payer: Self-pay

## 2019-06-27 DIAGNOSIS — C3412 Malignant neoplasm of upper lobe, left bronchus or lung: Secondary | ICD-10-CM | POA: Diagnosis not present

## 2019-06-27 DIAGNOSIS — K1230 Oral mucositis (ulcerative), unspecified: Secondary | ICD-10-CM | POA: Diagnosis not present

## 2019-06-27 DIAGNOSIS — C049 Malignant neoplasm of floor of mouth, unspecified: Secondary | ICD-10-CM | POA: Diagnosis not present

## 2019-06-27 DIAGNOSIS — Z5111 Encounter for antineoplastic chemotherapy: Secondary | ICD-10-CM | POA: Diagnosis not present

## 2019-06-27 DIAGNOSIS — C109 Malignant neoplasm of oropharynx, unspecified: Secondary | ICD-10-CM

## 2019-06-27 LAB — CBC WITH DIFFERENTIAL/PLATELET
Abs Immature Granulocytes: 0.03 10*3/uL (ref 0.00–0.07)
Basophils Absolute: 0 10*3/uL (ref 0.0–0.1)
Basophils Relative: 0 %
Eosinophils Absolute: 0.1 10*3/uL (ref 0.0–0.5)
Eosinophils Relative: 1 %
HCT: 43 % (ref 39.0–52.0)
Hemoglobin: 14.1 g/dL (ref 13.0–17.0)
Immature Granulocytes: 0 %
Lymphocytes Relative: 10 %
Lymphs Abs: 0.8 10*3/uL (ref 0.7–4.0)
MCH: 28.8 pg (ref 26.0–34.0)
MCHC: 32.8 g/dL (ref 30.0–36.0)
MCV: 87.9 fL (ref 80.0–100.0)
Monocytes Absolute: 0.5 10*3/uL (ref 0.1–1.0)
Monocytes Relative: 6 %
Neutro Abs: 7 10*3/uL (ref 1.7–7.7)
Neutrophils Relative %: 83 %
Platelets: 227 10*3/uL (ref 150–400)
RBC: 4.89 MIL/uL (ref 4.22–5.81)
RDW: 15.7 % — ABNORMAL HIGH (ref 11.5–15.5)
WBC: 8.5 10*3/uL (ref 4.0–10.5)
nRBC: 0 % (ref 0.0–0.2)

## 2019-06-27 LAB — COMPREHENSIVE METABOLIC PANEL
ALT: 17 U/L (ref 0–44)
AST: 16 U/L (ref 15–41)
Albumin: 4.1 g/dL (ref 3.5–5.0)
Alkaline Phosphatase: 80 U/L (ref 38–126)
Anion gap: 8 (ref 5–15)
BUN: 11 mg/dL (ref 8–23)
CO2: 23 mmol/L (ref 22–32)
Calcium: 9.2 mg/dL (ref 8.9–10.3)
Chloride: 104 mmol/L (ref 98–111)
Creatinine, Ser: 0.53 mg/dL — ABNORMAL LOW (ref 0.61–1.24)
GFR calc Af Amer: >60 mL/min (ref >60)
GFR calc non Af Amer: >60 mL/min (ref >60)
Glucose, Bld: 137 mg/dL — ABNORMAL HIGH (ref 70–99)
Potassium: 3.9 mmol/L (ref 3.5–5.1)
Sodium: 135 mmol/L (ref 135–145)
Total Bilirubin: 0.4 mg/dL (ref 0.3–1.2)
Total Protein: 6.5 g/dL (ref 6.5–8.1)

## 2019-06-27 LAB — MAGNESIUM: Magnesium: 1.7 mg/dL (ref 1.7–2.4)

## 2019-06-28 ENCOUNTER — Other Ambulatory Visit (HOSPITAL_COMMUNITY): Payer: Medicare Other

## 2019-06-28 ENCOUNTER — Ambulatory Visit (HOSPITAL_COMMUNITY): Payer: Medicare Other

## 2019-06-28 ENCOUNTER — Ambulatory Visit (HOSPITAL_COMMUNITY): Payer: Medicare Other | Admitting: Hematology

## 2019-06-28 DIAGNOSIS — C049 Malignant neoplasm of floor of mouth, unspecified: Secondary | ICD-10-CM | POA: Diagnosis not present

## 2019-06-28 LAB — ACID FAST CULTURE WITH REFLEXED SENSITIVITIES (MYCOBACTERIA): Acid Fast Culture: NEGATIVE

## 2019-06-29 ENCOUNTER — Encounter (HOSPITAL_COMMUNITY): Payer: Self-pay | Admitting: Hematology

## 2019-06-29 ENCOUNTER — Inpatient Hospital Stay (HOSPITAL_BASED_OUTPATIENT_CLINIC_OR_DEPARTMENT_OTHER): Payer: Medicare Other | Admitting: Hematology

## 2019-06-29 ENCOUNTER — Inpatient Hospital Stay (HOSPITAL_COMMUNITY): Payer: Medicare Other

## 2019-06-29 ENCOUNTER — Other Ambulatory Visit: Payer: Self-pay

## 2019-06-29 VITALS — BP 140/66 | HR 64 | Temp 97.5°F | Resp 18

## 2019-06-29 DIAGNOSIS — C3412 Malignant neoplasm of upper lobe, left bronchus or lung: Secondary | ICD-10-CM

## 2019-06-29 DIAGNOSIS — K1230 Oral mucositis (ulcerative), unspecified: Secondary | ICD-10-CM | POA: Diagnosis not present

## 2019-06-29 DIAGNOSIS — C049 Malignant neoplasm of floor of mouth, unspecified: Secondary | ICD-10-CM

## 2019-06-29 DIAGNOSIS — Z5111 Encounter for antineoplastic chemotherapy: Secondary | ICD-10-CM | POA: Diagnosis not present

## 2019-06-29 DIAGNOSIS — C040 Malignant neoplasm of anterior floor of mouth: Secondary | ICD-10-CM | POA: Diagnosis not present

## 2019-06-29 DIAGNOSIS — Z95828 Presence of other vascular implants and grafts: Secondary | ICD-10-CM

## 2019-06-29 MED ORDER — SODIUM CHLORIDE 0.9 % IV SOLN
Freq: Once | INTRAVENOUS | Status: AC
  Administered 2019-06-29: 11:00:00 via INTRAVENOUS
  Filled 2019-06-29: qty 5

## 2019-06-29 MED ORDER — HEPARIN SOD (PORK) LOCK FLUSH 100 UNIT/ML IV SOLN
500.0000 [IU] | Freq: Once | INTRAVENOUS | Status: AC | PRN
  Administered 2019-06-29: 500 [IU]

## 2019-06-29 MED ORDER — PALONOSETRON HCL INJECTION 0.25 MG/5ML
0.2500 mg | Freq: Once | INTRAVENOUS | Status: AC
  Administered 2019-06-29: 0.25 mg via INTRAVENOUS
  Filled 2019-06-29: qty 5

## 2019-06-29 MED ORDER — SODIUM CHLORIDE 0.9 % IV SOLN
Freq: Once | INTRAVENOUS | Status: AC
  Administered 2019-06-29: 10:00:00 via INTRAVENOUS

## 2019-06-29 MED ORDER — SODIUM CHLORIDE 0.9% FLUSH
10.0000 mL | INTRAVENOUS | Status: DC | PRN
  Administered 2019-06-29: 10 mL
  Filled 2019-06-29: qty 10

## 2019-06-29 MED ORDER — SODIUM CHLORIDE 0.9 % IV SOLN
Freq: Once | INTRAVENOUS | Status: AC
  Administered 2019-06-29: 14:00:00 via INTRAVENOUS

## 2019-06-29 MED ORDER — SODIUM CHLORIDE 0.9 % IV SOLN
40.0000 mg/m2 | Freq: Once | INTRAVENOUS | Status: AC
  Administered 2019-06-29: 78 mg via INTRAVENOUS
  Filled 2019-06-29: qty 78

## 2019-06-29 MED ORDER — POTASSIUM CHLORIDE 2 MEQ/ML IV SOLN
Freq: Once | INTRAVENOUS | Status: AC
  Administered 2019-06-29: 10:00:00 via INTRAVENOUS
  Filled 2019-06-29: qty 10

## 2019-06-29 NOTE — Progress Notes (Signed)
Patient tolerated chemotherapy with no complaints voiced.  Port site clean and dry with no bruising or swelling noted at site.  Good blood return noted before and after administration of chemotherapy.  Tegaderm dressing intact.   Patient left ambulatory with VSS and no s/s of distress noted.

## 2019-06-29 NOTE — Progress Notes (Signed)
1007 Labs from 06/27/19 reviewed with and pt seen by Dr. Delton Coombes and pt approved for chemo tx today per MD                                                                                     L[name] tolerated Cisplatin infusion and hydration well without complaints or incident
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
## 2019-06-29 NOTE — Assessment & Plan Note (Signed)
1.  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 Verlan Friends at [facility], pathology showing invasive squamous cell carcinoma, spindle cell carcinoma/carcinosarcoma variant, tumor measuring 2.9 cm, invading into the mandible, margins negative, no lymphovascular invasion, perineural invasion present.  Metastatic carcinoma involving 1 lymph node, largest focus 0.7 cm with no extranodal extension. -PET scan on 03/28/2019 shows 1.4 cm gastrohepatic lymph node SUV 4.5.  There is also 1 cm left upper lobe lung nodule with FDG uptake.  Cavitary left lung nodule is stable without hypermetabolic him. -Because of adverse features like positive perineural invasion and aggressive histology, systemic therapy with RT was recommended. - Weekly cisplatin with XRT started on 06/15/2019. - Cycle 2 of cisplatin was on 06/22/2019.  He denies any nausea, vomiting or diarrhea.  Mouth is sore and Magic mouthwash with lidocaine helps. - He is maintaining his weight.  Denies any tinnitus, hearing loss or neuropathy. -We have reviewed his labs.  He may proceed with cycle 3 today.  We will reevaluate him in 1 week.  2.  Nutrition: -He is taking in 4 cans of Osmolite daily via PEG tube. - He is able to drink water, Gatorade and juices.  He eats soft foods like eggs, mashed potatoes and beans.  3.  Non-small cell lung cancer: -Navigational bronchoscopy and biopsy by Dr. Roxan Hockey on 05/16/2019 was consistent with non-small cell carcinoma. -She will be considered for SBRT upon completion of chemoradiation therapy for head and neck cancer.

## 2019-06-29 NOTE — Patient Instructions (Signed)
[facility] Discharge Instructions for Patients Receiving Chemotherapy   Beginning January 23rd 2017 lab work for the [facility] will be done in the  Main lab at [facility] on 1st floor. If you have a lab appointment with the [facility] please come in thru the  Main Entrance and check in at the main information desk   Today you received the following chemotherapy agents Cisplatin as well as hydration. Follow-up as scheduled. Call clinic for any questions or concerns  To help prevent nausea and vomiting after your treatment, we encourage you to take your nausea medication   If you develop nausea and vomiting, or diarrhea that is not controlled by your medication, call the clinic.  The clinic phone number is [phone]. Office hours are Monday-Friday 8:30am-5:00pm.  BELOW ARE SYMPTOMS THAT SHOULD BE REPORTED IMMEDIATELY:  *FEVER GREATER THAN 101.0 F  *CHILLS WITH OR WITHOUT FEVER  NAUSEA AND VOMITING THAT IS NOT CONTROLLED WITH YOUR NAUSEA MEDICATION  *UNUSUAL SHORTNESS OF BREATH  *UNUSUAL BRUISING OR BLEEDING  TENDERNESS IN MOUTH AND THROAT WITH OR WITHOUT PRESENCE OF ULCERS  *URINARY PROBLEMS  *BOWEL PROBLEMS  UNUSUAL RASH Items with * indicate a potential emergency and should be followed up as soon as possible. If you have an emergency after office hours please contact your primary care physician or go to the nearest emergency department.  Please call the clinic during office hours if you have any questions or concerns.   You may also contact the Patient Navigator at [phone] should you have any questions or need assistance in obtaining follow up care.      Resources For Cancer Patients and their Caregivers ? American Cancer Society: Can assist with transportation, wigs, general needs, runs Look Good Feel Better.        (309)830-8232 ? Cancer Care: Provides financial assistance, online support groups, medication/co-pay assistance.   1-800-813-HOPE 838 507 8560) ? [place] Assists [place] cancer patients and their families through emotional , educational and financial support.  412-032-5820 ? [place] DSS Where to apply for food stamps, Medicaid and utility assistance. 215-267-0481 ? [employer]: Transportation to medical appointments. 407-022-3232 ? Social Security Administration: May apply for disability if have a Stage IV cancer. 564-731-2061 331-270-2191 ? [employer], Disability and Transit Services: Assists with nutrition, care and transit needs. (610)338-2708

## 2019-06-29 NOTE — Patient Instructions (Signed)
[facility] at [facility] Discharge Instructions  You were seen today by Dr. Katragadda. He went over your recent lab results. He will see you back in 1 week for labs and follow up.   Thank you for choosing [facility] at [facility] to provide your oncology and hematology care.  To afford each patient quality time with our provider, please arrive at least 15 minutes before your scheduled appointment time.   If you have a lab appointment with the Cancer Center please come in thru the  Main Entrance and check in at the main information desk  You need to re-schedule your appointment should you arrive 10 or more minutes late.  We strive to give you quality time with our providers, and arriving late affects you and other patients whose appointments are after yours.  Also, if you no show three or more times for appointments you may be dismissed from the clinic at the providers discretion.     Again, thank you for choosing [facility].  Our hope is that these requests will decrease the amount of time that you wait before being seen by our physicians.       _____________________________________________________________  Should you have questions after your visit to [facility], please contact our office at [phone] between the hours of 8:00 a.m. and 4:30 p.m.  Voicemails left after 4:00 p.m. will not be returned until the following business day.  For prescription refill requests, have your pharmacy contact our office and allow 72 hours.    Cancer Center Support Programs:   > Cancer Support Group  2nd Tuesday of the month 1pm-2pm, Journey Room    

## 2019-06-30 ENCOUNTER — Encounter (HOSPITAL_COMMUNITY): Payer: Self-pay

## 2019-06-30 ENCOUNTER — Inpatient Hospital Stay (HOSPITAL_COMMUNITY): Payer: Medicare Other

## 2019-06-30 VITALS — BP 143/65 | HR 69 | Temp 98.6°F | Resp 18

## 2019-06-30 DIAGNOSIS — K1230 Oral mucositis (ulcerative), unspecified: Secondary | ICD-10-CM | POA: Diagnosis not present

## 2019-06-30 DIAGNOSIS — Z5111 Encounter for antineoplastic chemotherapy: Secondary | ICD-10-CM | POA: Diagnosis not present

## 2019-06-30 DIAGNOSIS — C3412 Malignant neoplasm of upper lobe, left bronchus or lung: Secondary | ICD-10-CM | POA: Diagnosis not present

## 2019-06-30 DIAGNOSIS — C049 Malignant neoplasm of floor of mouth, unspecified: Secondary | ICD-10-CM | POA: Diagnosis not present

## 2019-06-30 DIAGNOSIS — C109 Malignant neoplasm of oropharynx, unspecified: Secondary | ICD-10-CM

## 2019-06-30 MED ORDER — SODIUM CHLORIDE 0.9% FLUSH
10.0000 mL | Freq: Once | INTRAVENOUS | Status: AC | PRN
  Administered 2019-06-30: 10 mL

## 2019-06-30 MED ORDER — HEPARIN SOD (PORK) LOCK FLUSH 100 UNIT/ML IV SOLN
500.0000 [IU] | Freq: Once | INTRAVENOUS | Status: AC | PRN
  Administered 2019-06-30: 500 [IU]

## 2019-06-30 MED ORDER — SODIUM CHLORIDE 0.9 % IV SOLN
Freq: Once | INTRAVENOUS | Status: AC
  Administered 2019-06-30: 14:00:00 via INTRAVENOUS
  Filled 2019-06-30: qty 500

## 2019-06-30 NOTE — Patient Instructions (Signed)
[facility] at [facility]  Discharge Instructions:   _______________________________________________________________  Thank you for choosing [facility] at [facility] to provide your oncology and hematology care.  To afford each patient quality time with our providers, please arrive at least 15 minutes before your scheduled appointment.  You need to re-schedule your appointment if you arrive 10 or more minutes late.  We strive to give you quality time with our providers, and arriving late affects you and other patients whose appointments are after yours.  Also, if you no show three or more times for appointments you may be dismissed from the clinic.  Again, thank you for choosing [facility] at [facility]. Our hope is that these requests will allow you access to exceptional care and in a timely manner. _______________________________________________________________  If you have questions after your visit, please contact our office at [phone] between the hours of 8:30 a.m. and 5:00 p.m. Voicemails left after 4:30 p.m. will not be returned until the following business day. _______________________________________________________________  For prescription refill requests, have your pharmacy contact our office. _______________________________________________________________  Recommendations made by the consultant and any test results will be sent to your referring physician. _______________________________________________________________ 

## 2019-06-30 NOTE — Progress Notes (Signed)
Pt presents today for IV hydration fluids only. VSS. Pt has no complaints of any changes since the last visit. Pt complains of mouth soreness and discomfort. See MAR. Pt taking medication for that and states it does help.   Hydration given today per MD orders. Tolerated infusion without adverse affects. Vital signs stable. No complaints at this time. Discharged from clinic ambulatory. F/U with [facility] as scheduled.

## 2019-07-01 ENCOUNTER — Inpatient Hospital Stay (HOSPITAL_COMMUNITY): Payer: Medicare Other

## 2019-07-01 ENCOUNTER — Encounter (HOSPITAL_COMMUNITY): Payer: Self-pay

## 2019-07-01 ENCOUNTER — Other Ambulatory Visit: Payer: Self-pay

## 2019-07-01 VITALS — BP 144/76 | HR 80 | Temp 97.5°F | Resp 18

## 2019-07-01 DIAGNOSIS — C3412 Malignant neoplasm of upper lobe, left bronchus or lung: Secondary | ICD-10-CM | POA: Diagnosis not present

## 2019-07-01 DIAGNOSIS — Z5111 Encounter for antineoplastic chemotherapy: Secondary | ICD-10-CM | POA: Diagnosis not present

## 2019-07-01 DIAGNOSIS — K1230 Oral mucositis (ulcerative), unspecified: Secondary | ICD-10-CM | POA: Diagnosis not present

## 2019-07-01 DIAGNOSIS — C049 Malignant neoplasm of floor of mouth, unspecified: Secondary | ICD-10-CM | POA: Diagnosis not present

## 2019-07-01 DIAGNOSIS — C109 Malignant neoplasm of oropharynx, unspecified: Secondary | ICD-10-CM

## 2019-07-01 MED ORDER — HEPARIN SOD (PORK) LOCK FLUSH 100 UNIT/ML IV SOLN
500.0000 [IU] | Freq: Once | INTRAVENOUS | Status: AC | PRN
  Administered 2019-07-01: 500 [IU]

## 2019-07-01 MED ORDER — SODIUM CHLORIDE 0.9% FLUSH
10.0000 mL | Freq: Once | INTRAVENOUS | Status: AC | PRN
  Administered 2019-07-01: 10 mL

## 2019-07-01 MED ORDER — SODIUM CHLORIDE 0.9 % IV SOLN
Freq: Once | INTRAVENOUS | Status: AC
  Administered 2019-07-01: 11:00:00 via INTRAVENOUS
  Filled 2019-07-01: qty 500

## 2019-07-01 NOTE — Progress Notes (Signed)
Abner Greenspan tolerated IV hydration with potassium and magnesium well without complaints or incident. VSS Pt discharged self ambulatory in satisfactory condition

## 2019-07-01 NOTE — Patient Instructions (Signed)
[place] at [facility] Discharge Instructions Received IV hydration with potassium and magnesium today. Follow-up as scheduled. Call clinic for any questions or concerns   Thank you for choosing [facility] at [facility] to provide your oncology and hematology care.  To afford each patient quality time with our provider, please arrive at least 15 minutes before your scheduled appointment time.   If you have a lab appointment with the [facility] please come in thru the  Main Entrance and check in at the main information desk  You need to re-schedule your appointment should you arrive 10 or more minutes late.  We strive to give you quality time with our providers, and arriving late affects you and other patients whose appointments are after yours.  Also, if you no show three or more times for appointments you may be dismissed from the clinic at the providers discretion.     Again, thank you for choosing [facility].  Our hope is that these requests will decrease the amount of time that you wait before being seen by our physicians.       _____________________________________________________________  Should you have questions after your visit to [facility], please contact our office at [phone] between the hours of 8:00 a.m. and 4:30 p.m.  Voicemails left after 4:00 p.m. will not be returned until the following business day.  For prescription refill requests, have your pharmacy contact our office and allow 72 hours.    Cancer Center Support Programs:   > Cancer Support Group  2nd Tuesday of the month 1pm-2pm, Journey Room

## 2019-07-04 DIAGNOSIS — C049 Malignant neoplasm of floor of mouth, unspecified: Secondary | ICD-10-CM | POA: Diagnosis not present

## 2019-07-05 ENCOUNTER — Other Ambulatory Visit: Payer: Self-pay

## 2019-07-05 ENCOUNTER — Inpatient Hospital Stay (HOSPITAL_COMMUNITY): Payer: Medicare Other

## 2019-07-05 DIAGNOSIS — K1230 Oral mucositis (ulcerative), unspecified: Secondary | ICD-10-CM | POA: Diagnosis not present

## 2019-07-05 DIAGNOSIS — Z95828 Presence of other vascular implants and grafts: Secondary | ICD-10-CM

## 2019-07-05 DIAGNOSIS — C049 Malignant neoplasm of floor of mouth, unspecified: Secondary | ICD-10-CM | POA: Diagnosis not present

## 2019-07-05 DIAGNOSIS — Z5111 Encounter for antineoplastic chemotherapy: Secondary | ICD-10-CM | POA: Diagnosis not present

## 2019-07-05 DIAGNOSIS — C3412 Malignant neoplasm of upper lobe, left bronchus or lung: Secondary | ICD-10-CM | POA: Diagnosis not present

## 2019-07-05 LAB — CBC WITH DIFFERENTIAL/PLATELET
Abs Immature Granulocytes: 0.01 10*3/uL (ref 0.00–0.07)
Basophils Absolute: 0 10*3/uL (ref 0.0–0.1)
Basophils Relative: 0 %
Eosinophils Absolute: 0 10*3/uL (ref 0.0–0.5)
Eosinophils Relative: 0 %
HCT: 39.4 % (ref 39.0–52.0)
Hemoglobin: 12.8 g/dL — ABNORMAL LOW (ref 13.0–17.0)
Immature Granulocytes: 0 %
Lymphocytes Relative: 11 %
Lymphs Abs: 0.6 10*3/uL — ABNORMAL LOW (ref 0.7–4.0)
MCH: 28.7 pg (ref 26.0–34.0)
MCHC: 32.5 g/dL (ref 30.0–36.0)
MCV: 88.3 fL (ref 80.0–100.0)
Monocytes Absolute: 0.5 10*3/uL (ref 0.1–1.0)
Monocytes Relative: 10 %
Neutro Abs: 4.2 10*3/uL (ref 1.7–7.7)
Neutrophils Relative %: 79 %
Platelets: 191 10*3/uL (ref 150–400)
RBC: 4.46 MIL/uL (ref 4.22–5.81)
RDW: 15.5 % (ref 11.5–15.5)
WBC: 5.3 10*3/uL (ref 4.0–10.5)
nRBC: 0 % (ref 0.0–0.2)

## 2019-07-05 LAB — BASIC METABOLIC PANEL
Anion gap: 8 (ref 5–15)
BUN: 10 mg/dL (ref 8–23)
CO2: 29 mmol/L (ref 22–32)
Calcium: 9.4 mg/dL (ref 8.9–10.3)
Chloride: 100 mmol/L (ref 98–111)
Creatinine, Ser: 0.59 mg/dL — ABNORMAL LOW (ref 0.61–1.24)
GFR calc Af Amer: >60 mL/min (ref >60)
GFR calc non Af Amer: >60 mL/min (ref >60)
Glucose, Bld: 116 mg/dL — ABNORMAL HIGH (ref 70–99)
Potassium: 4.2 mmol/L (ref 3.5–5.1)
Sodium: 137 mmol/L (ref 135–145)

## 2019-07-06 ENCOUNTER — Inpatient Hospital Stay (HOSPITAL_COMMUNITY): Payer: Medicare Other

## 2019-07-06 ENCOUNTER — Encounter (HOSPITAL_COMMUNITY): Payer: Self-pay | Admitting: Hematology

## 2019-07-06 ENCOUNTER — Inpatient Hospital Stay (HOSPITAL_BASED_OUTPATIENT_CLINIC_OR_DEPARTMENT_OTHER): Payer: Medicare Other | Admitting: Hematology

## 2019-07-06 VITALS — BP 138/71 | HR 75 | Resp 16

## 2019-07-06 DIAGNOSIS — C040 Malignant neoplasm of anterior floor of mouth: Secondary | ICD-10-CM | POA: Diagnosis not present

## 2019-07-06 DIAGNOSIS — K1230 Oral mucositis (ulcerative), unspecified: Secondary | ICD-10-CM

## 2019-07-06 DIAGNOSIS — C049 Malignant neoplasm of floor of mouth, unspecified: Secondary | ICD-10-CM | POA: Diagnosis not present

## 2019-07-06 DIAGNOSIS — Z95828 Presence of other vascular implants and grafts: Secondary | ICD-10-CM

## 2019-07-06 DIAGNOSIS — Z5111 Encounter for antineoplastic chemotherapy: Secondary | ICD-10-CM | POA: Diagnosis not present

## 2019-07-06 DIAGNOSIS — C3412 Malignant neoplasm of upper lobe, left bronchus or lung: Secondary | ICD-10-CM

## 2019-07-06 MED ORDER — SODIUM CHLORIDE 0.9 % IV SOLN
Freq: Once | INTRAVENOUS | Status: AC
  Administered 2019-07-06: 13:00:00 via INTRAVENOUS

## 2019-07-06 MED ORDER — PALONOSETRON HCL INJECTION 0.25 MG/5ML
0.2500 mg | Freq: Once | INTRAVENOUS | Status: AC
  Administered 2019-07-06: 0.25 mg via INTRAVENOUS
  Filled 2019-07-06: qty 5

## 2019-07-06 MED ORDER — SODIUM CHLORIDE 0.9% FLUSH
10.0000 mL | INTRAVENOUS | Status: DC | PRN
  Administered 2019-07-06: 10 mL
  Filled 2019-07-06: qty 10

## 2019-07-06 MED ORDER — POTASSIUM CHLORIDE 2 MEQ/ML IV SOLN
Freq: Once | INTRAVENOUS | Status: AC
  Administered 2019-07-06: 09:00:00 via INTRAVENOUS
  Filled 2019-07-06: qty 10

## 2019-07-06 MED ORDER — SODIUM CHLORIDE 0.9 % IV SOLN
30.0000 mg/m2 | Freq: Once | INTRAVENOUS | Status: AC
  Administered 2019-07-06: 59 mg via INTRAVENOUS
  Filled 2019-07-06: qty 59

## 2019-07-06 MED ORDER — SODIUM CHLORIDE 0.9 % IV SOLN
Freq: Once | INTRAVENOUS | Status: AC
  Administered 2019-07-06: 11:00:00 via INTRAVENOUS

## 2019-07-06 MED ORDER — SODIUM CHLORIDE 0.9 % IV SOLN
Freq: Once | INTRAVENOUS | Status: AC
  Administered 2019-07-06: 11:00:00 via INTRAVENOUS
  Filled 2019-07-06: qty 5

## 2019-07-06 NOTE — Assessment & Plan Note (Signed)
1.  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 Verlan Friends at [facility], pathology showing invasive squamous cell carcinoma, spindle cell carcinoma/carcinosarcoma variant, tumor measuring 2.9 cm, invading into the mandible, margins negative, no lymphovascular invasion, perineural invasion present.  Metastatic carcinoma involving 1 lymph node, largest focus 0.7 cm with no extranodal extension. -PET scan on 03/28/2019 shows 1.4 cm gastrohepatic lymph node SUV 4.5.  There is also 1 cm left upper lobe lung nodule with FDG uptake.  Cavitary left lung nodule is stable without hypermetabolic him. -Because of adverse features like positive perineural invasion and aggressive histology, systemic therapy with RT was recommended. - Weekly cisplatin with XRT started on 06/15/2019. - Week 3 of cisplatin on 06/29/2019. -He has severe thrush in the oral cavity.  He reported that liquids are coming through his left nostril when he drinks occasionally. -Dr.Yanagihara has started him on Diflucan 100 mg twice daily for 2 weeks. - He denies any neuropathy, tinnitus or hearing loss. - We will proceed with cycle 4 of cisplatin today.  I will dose reduce it to 30 mg per metered square.  We will reevaluate him in 1 week.  2.  Nutrition: -He is taking in 4 cans of Osmolite daily via PEG tube. -Drinking fluids and eating soft foods he is getting difficult due to mucositis and thrush.  He was encouraged to increase Osmolite to 5 cans/day.  3.  Non-small cell lung cancer: -Navigational bronchoscopy and biopsy by Dr. Roxan Hockey on 05/16/2019 was consistent with non-small cell carcinoma. -She will be considered for SBRT upon completion of chemoradiation therapy for head and neck cancer.

## 2019-07-06 NOTE — Progress Notes (Signed)
Labs reviewed with MD at office visit today. Will dose reduce cisplatin per MD. Proceed as planned.   Treatment given per orders. Patient tolerated it well without problems. Vitals stable and discharged home from clinic ambulatory. Follow up as scheduled.

## 2019-07-06 NOTE — Patient Instructions (Addendum)
[facility] at [facility] Discharge Instructions  You were seen today by Dr. Katragadda. He went over your recent lab results. He will see you back in 1 week for labs and follow up.   Thank you for choosing  [facility] at [facility] to provide your oncology and hematology care.  To afford each patient quality time with our provider, please arrive at least 15 minutes before your scheduled appointment time.   If you have a lab appointment with the Cancer Center please come in thru the  Main Entrance and check in at the main information desk  You need to re-schedule your appointment should you arrive 10 or more minutes late.  We strive to give you quality time with our providers, and arriving late affects you and other patients whose appointments are after yours.  Also, if you no show three or more times for appointments you may be dismissed from the clinic at the providers discretion.     Again, thank you for choosing [facility].  Our hope is that these requests will decrease the amount of time that you wait before being seen by our physicians.       _____________________________________________________________  Should you have questions after your visit to [facility], please contact our office at [phone] between the hours of 8:00 a.m. and 4:30 p.m.  Voicemails left after 4:00 p.m. will not be returned until the following business day.  For prescription refill requests, have your pharmacy contact our office and allow 72 hours.    Cancer Center Support Programs:   > Cancer Support Group  2nd Tuesday of the month 1pm-2pm, Journey Room    
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
## 2019-07-06 NOTE — Patient Instructions (Signed)
[facility] Discharge Instructions for Patients Receiving Chemotherapy  Today you received the following chemotherapy agents   To help prevent nausea and vomiting after your treatment, we encourage you to take your nausea medication    If you develop nausea and vomiting that is not controlled by your nausea medication, call the clinic.   BELOW ARE SYMPTOMS THAT SHOULD BE REPORTED IMMEDIATELY:  *FEVER GREATER THAN 100.5 F  *CHILLS WITH OR WITHOUT FEVER  NAUSEA AND VOMITING THAT IS NOT CONTROLLED WITH YOUR NAUSEA MEDICATION  *UNUSUAL SHORTNESS OF BREATH  *UNUSUAL BRUISING OR BLEEDING  TENDERNESS IN MOUTH AND THROAT WITH OR WITHOUT PRESENCE OF ULCERS  *URINARY PROBLEMS  *BOWEL PROBLEMS  UNUSUAL RASH Items with * indicate a potential emergency and should be followed up as soon as possible.  Feel free to call the clinic should you have any questions or concerns. The clinic phone number is [phone].  Please show the [facility] at check-in to the Emergency Department and triage nurse.

## 2019-07-07 ENCOUNTER — Other Ambulatory Visit: Payer: Self-pay

## 2019-07-07 ENCOUNTER — Inpatient Hospital Stay (HOSPITAL_COMMUNITY): Payer: Medicare Other

## 2019-07-07 VITALS — BP 143/66 | HR 75 | Temp 96.9°F | Resp 16

## 2019-07-07 DIAGNOSIS — K1230 Oral mucositis (ulcerative), unspecified: Secondary | ICD-10-CM | POA: Diagnosis not present

## 2019-07-07 DIAGNOSIS — C3412 Malignant neoplasm of upper lobe, left bronchus or lung: Secondary | ICD-10-CM | POA: Diagnosis not present

## 2019-07-07 DIAGNOSIS — C109 Malignant neoplasm of oropharynx, unspecified: Secondary | ICD-10-CM

## 2019-07-07 DIAGNOSIS — Z5111 Encounter for antineoplastic chemotherapy: Secondary | ICD-10-CM | POA: Diagnosis not present

## 2019-07-07 DIAGNOSIS — C049 Malignant neoplasm of floor of mouth, unspecified: Secondary | ICD-10-CM | POA: Diagnosis not present

## 2019-07-07 MED ORDER — SODIUM CHLORIDE 0.9% FLUSH
10.0000 mL | Freq: Once | INTRAVENOUS | Status: AC | PRN
  Administered 2019-07-07: 10 mL

## 2019-07-07 MED ORDER — SODIUM CHLORIDE 0.9 % IV SOLN
Freq: Once | INTRAVENOUS | Status: AC
  Administered 2019-07-07: 14:00:00 via INTRAVENOUS
  Filled 2019-07-07: qty 500

## 2019-07-07 MED ORDER — HEPARIN SOD (PORK) LOCK FLUSH 100 UNIT/ML IV SOLN
500.0000 [IU] | Freq: Once | INTRAVENOUS | Status: AC | PRN
  Administered 2019-07-07: 500 [IU]

## 2019-07-07 NOTE — Progress Notes (Signed)
Patient here today for hydration fluids.  Patient tolerated it well without problems. Vitals stable and discharged home from clinic ambulatory. Follow up as scheduled.

## 2019-07-07 NOTE — Patient Instructions (Signed)
[facility]  Discharge Instructions:   _______________________________________________________________  Thank you for choosing [facility] at [facility] to provide your oncology and hematology care.  To afford each patient quality time with our providers, please arrive at least 15 minutes before your scheduled appointment.  You need to re-schedule your appointment if you arrive 10 or more minutes late.  We strive to give you quality time with our providers, and arriving late affects you and other patients whose appointments are after yours.  Also, if you no show three or more times for appointments you may be dismissed from the clinic.  Again, thank you for choosing [facility] at [facility]. Our hope is that these requests will allow you access to exceptional care and in a timely manner. _______________________________________________________________  If you have questions after your visit, please contact our office at [phone] between the hours of 8:30 a.m. and 5:00 p.m. Voicemails left after 4:30 p.m. will not be returned until the following business day. _______________________________________________________________  For prescription refill requests, have your pharmacy contact our office. _______________________________________________________________  Recommendations made by the consultant and any test results will be sent to your referring physician. _______________________________________________________________ 

## 2019-07-08 ENCOUNTER — Inpatient Hospital Stay (HOSPITAL_COMMUNITY): Payer: Medicare Other

## 2019-07-08 VITALS — BP 165/88 | HR 79 | Temp 96.8°F | Resp 18

## 2019-07-08 DIAGNOSIS — C3412 Malignant neoplasm of upper lobe, left bronchus or lung: Secondary | ICD-10-CM | POA: Diagnosis not present

## 2019-07-08 DIAGNOSIS — C049 Malignant neoplasm of floor of mouth, unspecified: Secondary | ICD-10-CM | POA: Diagnosis not present

## 2019-07-08 DIAGNOSIS — K1230 Oral mucositis (ulcerative), unspecified: Secondary | ICD-10-CM | POA: Diagnosis not present

## 2019-07-08 DIAGNOSIS — Z5111 Encounter for antineoplastic chemotherapy: Secondary | ICD-10-CM | POA: Diagnosis not present

## 2019-07-08 DIAGNOSIS — C109 Malignant neoplasm of oropharynx, unspecified: Secondary | ICD-10-CM

## 2019-07-08 MED ORDER — SODIUM CHLORIDE 0.9% FLUSH
10.0000 mL | Freq: Once | INTRAVENOUS | Status: AC | PRN
  Administered 2019-07-08: 10 mL

## 2019-07-08 MED ORDER — SODIUM CHLORIDE 0.9 % IV SOLN
Freq: Once | INTRAVENOUS | Status: AC
  Administered 2019-07-08: 11:00:00 via INTRAVENOUS
  Filled 2019-07-08: qty 500

## 2019-07-08 MED ORDER — HEPARIN SOD (PORK) LOCK FLUSH 100 UNIT/ML IV SOLN
500.0000 [IU] | Freq: Once | INTRAVENOUS | Status: AC | PRN
  Administered 2019-07-08: 500 [IU]

## 2019-07-08 MED ORDER — NYSTATIN 100000 UNIT/ML MT SUSP: 5.0000 mL | Freq: Four times a day (QID) | OROMUCOSAL | 0 refills | Status: AC

## 2019-07-08 NOTE — Progress Notes (Signed)
Hydration fluids today. Patient complaining of increased pain in his mouth/gum area. NP to bedside for evaluation.    Patient tolerated it well without problems. Vitals stable and discharged home from clinic ambulatory. Follow up as scheduled.

## 2019-07-11 DIAGNOSIS — C040 Malignant neoplasm of anterior floor of mouth: Secondary | ICD-10-CM | POA: Diagnosis not present

## 2019-07-12 ENCOUNTER — Inpatient Hospital Stay (HOSPITAL_COMMUNITY): Payer: Medicare Other | Attending: Hematology

## 2019-07-12 ENCOUNTER — Other Ambulatory Visit: Payer: Self-pay

## 2019-07-12 DIAGNOSIS — K1230 Oral mucositis (ulcerative), unspecified: Secondary | ICD-10-CM | POA: Diagnosis not present

## 2019-07-12 DIAGNOSIS — C3490 Malignant neoplasm of unspecified part of unspecified bronchus or lung: Secondary | ICD-10-CM | POA: Diagnosis not present

## 2019-07-12 DIAGNOSIS — Z5111 Encounter for antineoplastic chemotherapy: Secondary | ICD-10-CM | POA: Insufficient documentation

## 2019-07-12 DIAGNOSIS — Z95828 Presence of other vascular implants and grafts: Secondary | ICD-10-CM

## 2019-07-12 DIAGNOSIS — C049 Malignant neoplasm of floor of mouth, unspecified: Secondary | ICD-10-CM | POA: Insufficient documentation

## 2019-07-12 DIAGNOSIS — Z931 Gastrostomy status: Secondary | ICD-10-CM | POA: Insufficient documentation

## 2019-07-12 DIAGNOSIS — E875 Hyperkalemia: Secondary | ICD-10-CM | POA: Insufficient documentation

## 2019-07-12 DIAGNOSIS — C040 Malignant neoplasm of anterior floor of mouth: Secondary | ICD-10-CM | POA: Diagnosis not present

## 2019-07-12 LAB — CBC WITH DIFFERENTIAL/PLATELET
Abs Immature Granulocytes: 0.02 10*3/uL (ref 0.00–0.07)
Basophils Absolute: 0 10*3/uL (ref 0.0–0.1)
Basophils Relative: 1 %
Eosinophils Absolute: 0 10*3/uL (ref 0.0–0.5)
Eosinophils Relative: 0 %
HCT: 39 % (ref 39.0–52.0)
Hemoglobin: 12.7 g/dL — ABNORMAL LOW (ref 13.0–17.0)
Immature Granulocytes: 0 %
Lymphocytes Relative: 13 %
Lymphs Abs: 0.8 10*3/uL (ref 0.7–4.0)
MCH: 28.3 pg (ref 26.0–34.0)
MCHC: 32.6 g/dL (ref 30.0–36.0)
MCV: 87.1 fL (ref 80.0–100.0)
Monocytes Absolute: 0.5 10*3/uL (ref 0.1–1.0)
Monocytes Relative: 8 %
Neutro Abs: 5 10*3/uL (ref 1.7–7.7)
Neutrophils Relative %: 78 %
Platelets: 144 10*3/uL — ABNORMAL LOW (ref 150–400)
RBC: 4.48 MIL/uL (ref 4.22–5.81)
RDW: 15.9 % — ABNORMAL HIGH (ref 11.5–15.5)
WBC: 6.4 10*3/uL (ref 4.0–10.5)
nRBC: 0 % (ref 0.0–0.2)

## 2019-07-12 LAB — BASIC METABOLIC PANEL
Anion gap: 10 (ref 5–15)
BUN: 13 mg/dL (ref 8–23)
CO2: 26 mmol/L (ref 22–32)
Calcium: 9.7 mg/dL (ref 8.9–10.3)
Chloride: 99 mmol/L (ref 98–111)
Creatinine, Ser: 0.68 mg/dL (ref 0.61–1.24)
GFR calc Af Amer: >60 mL/min (ref >60)
GFR calc non Af Amer: >60 mL/min (ref >60)
Glucose, Bld: 141 mg/dL — ABNORMAL HIGH (ref 70–99)
Potassium: 4.8 mmol/L (ref 3.5–5.1)
Sodium: 135 mmol/L (ref 135–145)

## 2019-07-12 LAB — HEPATIC FUNCTION PANEL
ALT: 16 U/L (ref 0–44)
AST: 17 U/L (ref 15–41)
Albumin: 4.1 g/dL (ref 3.5–5.0)
Alkaline Phosphatase: 88 U/L (ref 38–126)
Bilirubin, Direct: 0.1 mg/dL (ref 0.0–0.2)
Indirect Bilirubin: 0.5 mg/dL (ref 0.3–0.9)
Total Bilirubin: 0.6 mg/dL (ref 0.3–1.2)
Total Protein: 7 g/dL (ref 6.5–8.1)

## 2019-07-13 ENCOUNTER — Other Ambulatory Visit: Payer: Self-pay

## 2019-07-13 ENCOUNTER — Inpatient Hospital Stay (HOSPITAL_COMMUNITY): Payer: Medicare Other

## 2019-07-13 ENCOUNTER — Inpatient Hospital Stay (HOSPITAL_BASED_OUTPATIENT_CLINIC_OR_DEPARTMENT_OTHER): Payer: Medicare Other | Admitting: Hematology

## 2019-07-13 ENCOUNTER — Encounter (HOSPITAL_COMMUNITY): Payer: Self-pay

## 2019-07-13 ENCOUNTER — Encounter (HOSPITAL_COMMUNITY): Payer: Self-pay | Admitting: Hematology

## 2019-07-13 VITALS — BP 143/71 | HR 70 | Temp 97.6°F | Resp 18 | Wt 150.2 lb

## 2019-07-13 DIAGNOSIS — C049 Malignant neoplasm of floor of mouth, unspecified: Secondary | ICD-10-CM | POA: Diagnosis not present

## 2019-07-13 DIAGNOSIS — K1230 Oral mucositis (ulcerative), unspecified: Secondary | ICD-10-CM | POA: Diagnosis not present

## 2019-07-13 DIAGNOSIS — E875 Hyperkalemia: Secondary | ICD-10-CM | POA: Diagnosis not present

## 2019-07-13 DIAGNOSIS — C109 Malignant neoplasm of oropharynx, unspecified: Secondary | ICD-10-CM

## 2019-07-13 DIAGNOSIS — Z5111 Encounter for antineoplastic chemotherapy: Secondary | ICD-10-CM | POA: Diagnosis not present

## 2019-07-13 DIAGNOSIS — C3490 Malignant neoplasm of unspecified part of unspecified bronchus or lung: Secondary | ICD-10-CM | POA: Diagnosis not present

## 2019-07-13 DIAGNOSIS — Z931 Gastrostomy status: Secondary | ICD-10-CM | POA: Diagnosis not present

## 2019-07-13 DIAGNOSIS — Z95828 Presence of other vascular implants and grafts: Secondary | ICD-10-CM

## 2019-07-13 DIAGNOSIS — C040 Malignant neoplasm of anterior floor of mouth: Secondary | ICD-10-CM | POA: Diagnosis not present

## 2019-07-13 MED ORDER — SODIUM CHLORIDE 0.9 % IV SOLN
Freq: Once | INTRAVENOUS | Status: AC
  Administered 2019-07-13: 12:00:00 via INTRAVENOUS
  Filled 2019-07-13: qty 5

## 2019-07-13 MED ORDER — LORAZEPAM 2 MG/ML IJ SOLN
0.5000 mg | Freq: Once | INTRAMUSCULAR | Status: AC
  Administered 2019-07-13: 0.5 mg via INTRAVENOUS
  Filled 2019-07-13: qty 1

## 2019-07-13 MED ORDER — HEPARIN SOD (PORK) LOCK FLUSH 100 UNIT/ML IV SOLN
500.0000 [IU] | Freq: Once | INTRAVENOUS | Status: AC | PRN
  Administered 2019-07-13: 500 [IU]

## 2019-07-13 MED ORDER — SODIUM CHLORIDE 0.9 % IV SOLN
32.0000 mg/m2 | Freq: Once | INTRAVENOUS | Status: AC
  Administered 2019-07-13: 62 mg via INTRAVENOUS
  Filled 2019-07-13: qty 62

## 2019-07-13 MED ORDER — SODIUM CHLORIDE 0.9 % IV SOLN
Freq: Once | INTRAVENOUS | Status: AC
  Administered 2019-07-13: 12:00:00 via INTRAVENOUS

## 2019-07-13 MED ORDER — SODIUM CHLORIDE 0.9% FLUSH
10.0000 mL | INTRAVENOUS | Status: DC | PRN
  Administered 2019-07-13: 10 mL
  Filled 2019-07-13: qty 10

## 2019-07-13 MED ORDER — SODIUM CHLORIDE 0.9 % IV SOLN
Freq: Once | INTRAVENOUS | Status: AC
  Administered 2019-07-13: 13:00:00 via INTRAVENOUS

## 2019-07-13 MED ORDER — PALONOSETRON HCL INJECTION 0.25 MG/5ML
0.2500 mg | Freq: Once | INTRAVENOUS | Status: AC
  Administered 2019-07-13: 12:00:00 0.25 mg via INTRAVENOUS
  Filled 2019-07-13: qty 5

## 2019-07-13 MED ORDER — POTASSIUM CHLORIDE 2 MEQ/ML IV SOLN
Freq: Once | INTRAVENOUS | Status: AC
  Administered 2019-07-13: 10:00:00 via INTRAVENOUS
  Filled 2019-07-13: qty 10

## 2019-07-13 MED ORDER — MORPHINE SULFATE (CONCENTRATE) 20 MG/ML PO SOLN: 10.0000 mg | Freq: Three times a day (TID) | ORAL | 0 refills | Status: DC | PRN

## 2019-07-13 NOTE — Assessment & Plan Note (Signed)
1.  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 -Week 4 of cisplatin on 07/06/2019, dose reduced to 30 mg/m. - Denies any neuropathy, ringing in the ears or hearing loss. -We reviewed his blood work.  He will proceed with week 5 of treatment at the same reduced dose. -He will be reevaluated in 1 week.  2.  Mucositis: - He has severe mucositis of the gums and soft palate.  He is using Magic mouthwash which is not helping. - I have given a prescription for morphine 20 mg/mL, 0.5 mL every 8 hours as needed.  3.  Nutrition: -He is not able to eat by mouth at all.  He is taking in 2 cans of Osmolite 3 times a day via PEG tube.  4.  Non-small cell lung cancer: -Navigational bronchoscopy and biopsy by Dr. Roxan Hockey on 05/16/2019 was consistent with non-small cell lung cancer. - He will be considered for SBRT upon completion of chemoradiation therapy for head and neck cancer.

## 2019-07-13 NOTE — Patient Instructions (Signed)
[facility] Discharge Instructions for Patients Receiving Chemotherapy  Today you received the following chemotherapy agents   To help prevent nausea and vomiting after your treatment, we encourage you to take your nausea medication   If you develop nausea and vomiting that is not controlled by your nausea medication, call the clinic.   BELOW ARE SYMPTOMS THAT SHOULD BE REPORTED IMMEDIATELY:  *FEVER GREATER THAN 100.5 F  *CHILLS WITH OR WITHOUT FEVER  NAUSEA AND VOMITING THAT IS NOT CONTROLLED WITH YOUR NAUSEA MEDICATION  *UNUSUAL SHORTNESS OF BREATH  *UNUSUAL BRUISING OR BLEEDING  TENDERNESS IN MOUTH AND THROAT WITH OR WITHOUT PRESENCE OF ULCERS  *URINARY PROBLEMS  *BOWEL PROBLEMS  UNUSUAL RASH Items with * indicate a potential emergency and should be followed up as soon as possible.  Feel free to call the clinic should you have any questions or concerns. The clinic phone number is [phone].  Please show the CHEMO ALERT CARD at check-in to the Emergency Department and triage nurse.   

## 2019-07-13 NOTE — Progress Notes (Signed)
07/13/19  Received verbal order for: Ativan 0.5 mg Intravenously x 1 to be given prior to chemotherapy today.  Order entered to reflect request.  Thank you for the opportunity to participate in Mr. Victorian care.  V.O. Dr Beckey Downing LPN/Carol Ronnald Ramp, PharmD
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
## 2019-07-13 NOTE — Patient Instructions (Signed)
[facility] at [facility] Discharge Instructions  You were seen today by Dr. Katragadda. He went over your recent lab results. He will see you back in 1 week for labs and follow up.   Thank you for choosing [facility] at [facility] to provide your oncology and hematology care.  To afford each patient quality time with our provider, please arrive at least 15 minutes before your scheduled appointment time.   If you have a lab appointment with the Cancer Center please come in thru the  Main Entrance and check in at the main information desk  You need to re-schedule your appointment should you arrive 10 or more minutes late.  We strive to give you quality time with our providers, and arriving late affects you and other patients whose appointments are after yours.  Also, if you no show three or more times for appointments you may be dismissed from the clinic at the providers discretion.     Again, thank you for choosing [facility].  Our hope is that these requests will decrease the amount of time that you wait before being seen by our physicians.       _____________________________________________________________  Should you have questions after your visit to  Cancer Center, please contact our office at [phone] between the hours of 8:00 a.m. and 4:30 p.m.  Voicemails left after 4:00 p.m. will not be returned until the following business day.  For prescription refill requests, have your pharmacy contact our office and allow 72 hours.    Cancer Center Support Programs:   > Cancer Support Group  2nd Tuesday of the month 1pm-2pm, Journey Room    

## 2019-07-13 NOTE — Progress Notes (Signed)
Labs reviewed with MD today , proceed with treatment today as planned.   Treatment given per orders. Patient tolerated it well without problems. Vitals stable and discharged home from clinic ambulatory. Follow up as scheduled.

## 2019-07-14 ENCOUNTER — Inpatient Hospital Stay (HOSPITAL_COMMUNITY): Payer: Medicare Other

## 2019-07-14 VITALS — BP 101/59 | HR 81 | Temp 97.5°F | Resp 18

## 2019-07-14 DIAGNOSIS — C040 Malignant neoplasm of anterior floor of mouth: Secondary | ICD-10-CM | POA: Diagnosis not present

## 2019-07-14 DIAGNOSIS — C109 Malignant neoplasm of oropharynx, unspecified: Secondary | ICD-10-CM

## 2019-07-14 DIAGNOSIS — Z5111 Encounter for antineoplastic chemotherapy: Secondary | ICD-10-CM | POA: Diagnosis not present

## 2019-07-14 DIAGNOSIS — C3490 Malignant neoplasm of unspecified part of unspecified bronchus or lung: Secondary | ICD-10-CM | POA: Diagnosis not present

## 2019-07-14 DIAGNOSIS — E875 Hyperkalemia: Secondary | ICD-10-CM | POA: Diagnosis not present

## 2019-07-14 DIAGNOSIS — K1230 Oral mucositis (ulcerative), unspecified: Secondary | ICD-10-CM | POA: Diagnosis not present

## 2019-07-14 DIAGNOSIS — C049 Malignant neoplasm of floor of mouth, unspecified: Secondary | ICD-10-CM | POA: Diagnosis not present

## 2019-07-14 DIAGNOSIS — Z931 Gastrostomy status: Secondary | ICD-10-CM | POA: Diagnosis not present

## 2019-07-14 MED ORDER — HEPARIN SOD (PORK) LOCK FLUSH 100 UNIT/ML IV SOLN: 500.0000 [IU] | Freq: Once | INTRAVENOUS | Status: DC | PRN

## 2019-07-14 MED ORDER — SODIUM CHLORIDE 0.9% FLUSH: 10.0000 mL | Freq: Once | INTRAVENOUS | Status: DC | PRN

## 2019-07-14 MED ORDER — SODIUM CHLORIDE 0.9 % IV SOLN
Freq: Once | INTRAVENOUS | Status: AC
  Administered 2019-07-14: 14:00:00 via INTRAVENOUS
  Filled 2019-07-14: qty 500

## 2019-07-14 NOTE — Progress Notes (Signed)
Hydration fluids given today per orders. Patient stated the morphine for is mouth is helping.   Patient tolerated it well without problems. Vitals stable and discharged home from clinic ambulatory. Follow up as scheduled.

## 2019-07-15 ENCOUNTER — Other Ambulatory Visit: Payer: Self-pay

## 2019-07-15 ENCOUNTER — Encounter (HOSPITAL_COMMUNITY): Payer: Self-pay

## 2019-07-15 ENCOUNTER — Inpatient Hospital Stay (HOSPITAL_COMMUNITY): Payer: Medicare Other

## 2019-07-15 ENCOUNTER — Ambulatory Visit (HOSPITAL_COMMUNITY): Payer: Medicare Other

## 2019-07-15 VITALS — BP 124/58 | HR 80 | Temp 97.0°F | Resp 18

## 2019-07-15 DIAGNOSIS — E875 Hyperkalemia: Secondary | ICD-10-CM | POA: Diagnosis not present

## 2019-07-15 DIAGNOSIS — Z5111 Encounter for antineoplastic chemotherapy: Secondary | ICD-10-CM | POA: Diagnosis not present

## 2019-07-15 DIAGNOSIS — C109 Malignant neoplasm of oropharynx, unspecified: Secondary | ICD-10-CM

## 2019-07-15 DIAGNOSIS — C049 Malignant neoplasm of floor of mouth, unspecified: Secondary | ICD-10-CM | POA: Diagnosis not present

## 2019-07-15 DIAGNOSIS — C040 Malignant neoplasm of anterior floor of mouth: Secondary | ICD-10-CM | POA: Diagnosis not present

## 2019-07-15 DIAGNOSIS — C3490 Malignant neoplasm of unspecified part of unspecified bronchus or lung: Secondary | ICD-10-CM | POA: Diagnosis not present

## 2019-07-15 DIAGNOSIS — Z931 Gastrostomy status: Secondary | ICD-10-CM | POA: Diagnosis not present

## 2019-07-15 DIAGNOSIS — K1230 Oral mucositis (ulcerative), unspecified: Secondary | ICD-10-CM | POA: Diagnosis not present

## 2019-07-15 MED ORDER — SODIUM CHLORIDE 0.9 % IV SOLN
Freq: Once | INTRAVENOUS | Status: AC
  Administered 2019-07-15: 10:00:00 via INTRAVENOUS
  Filled 2019-07-15: qty 500

## 2019-07-15 MED ORDER — HEPARIN SOD (PORK) LOCK FLUSH 100 UNIT/ML IV SOLN
500.0000 [IU] | Freq: Once | INTRAVENOUS | Status: AC | PRN
  Administered 2019-07-15: 500 [IU]

## 2019-07-15 MED ORDER — SODIUM CHLORIDE 0.9% FLUSH
10.0000 mL | Freq: Once | INTRAVENOUS | Status: AC | PRN
  Administered 2019-07-15: 10 mL

## 2019-07-15 NOTE — Progress Notes (Signed)
Nutrition Follow-up:  Last seen by Ovid Curd, RD on 01/2019.    Acute add on per nursing today.   Patient with stage IV SCC of mouth.  Patient receiving chemotherapy.  Patient s/p surgical resection on 02/25/19 at [facility].  Spoke with patient during fluids this am.  Patient reports that prior to about 1 week ago was only taking 3 cartons of formula and eating soft foods.  Reports over the last week has increased his tube feeding to 6 cartons per day 2 cartons TID due to severe mucositis.  Reports that he is still able to drink 4-5, 8 oz cups of water per day orally.  Reports he is giving 2 syringes of water before and 2-3 syringes after each feeding.    Reports normal bowel movements about 2 a day.  Reports that nausea is under control with nausea pills.   Medications: magic mouthwash, nystatin, morphine, compazine, metformin  Labs: glucose 141  Anthropometrics:   Weight 150 lb 3.2 oz on 8/5 decreased from 158-160 lb in May 2020 after surgery.  Prior to that in 180s.    Estimated Energy Needs  Kcals: 2000-2400 Protein: 100-120 g  Fluid: 2.4 L  NUTRITION DIAGNOSIS: Inadequate oral intake related to severe mucositis as evidenced by weight loss and relying on PEG feeding    INTERVENTION:  Patient called RD when got home and stated that he has glucerna 1.5 tube feeding formula. RD also confirmed with [facility] representative that patient is receiving glucerna 1.5 tube feeding.  Recommend for patient to continue glucerna 1.5, 6 cartons per day (2 carton TID).  Water flush of 140ml before feeding and 156ml water after feeding.   Tube feeding provides 2136 calories, 117 g protein 1980 ml free water (formula and water flush) Patient reports drinking additional water at this time as well via mouth of 960-1260ml per day.   Spoke with [facility] representative regarding patient needing more glucerna 1.5.      MONITORING, EVALUATION, GOAL: Patient will consume adequate calories via feeding  tube to maintain weight   NEXT VISIT: Friday August 21  Joli B. Zenia Resides, [place], [place] Registered Dietitian [phone] (pager)

## 2019-07-15 NOTE — Patient Instructions (Signed)
[facility] at [facility] Discharge Instructions  Received IV hydration with magnesium and potassium today. Follow-up as scheduled. Call clinic for any questions or concerns   Thank you for choosing [facility] at [facility] to provide your oncology and hematology care.  To afford each patient quality time with our provider, please arrive at least 15 minutes before your scheduled appointment time.   If you have a lab appointment with the [facility] please come in thru the  Main Entrance and check in at the main information desk  You need to re-schedule your appointment should you arrive 10 or more minutes late.  We strive to give you quality time with our providers, and arriving late affects you and other patients whose appointments are after yours.  Also, if you no show three or more times for appointments you may be dismissed from the clinic at the providers discretion.     Again, thank you for choosing [facility].  Our hope is that these requests will decrease the amount of time that you wait before being seen by our physicians.       _____________________________________________________________  Should you have questions after your visit to [facility], please contact our office at [phone] between the hours of 8:00 a.m. and 4:30 p.m.  Voicemails left after 4:00 p.m. will not be returned until the following business day.  For prescription refill requests, have your pharmacy contact our office and allow 72 hours.    Cancer Center Support Programs:   > Cancer Support Group  2nd Tuesday of the month 1pm-2pm, Journey Room

## 2019-07-15 NOTE — Progress Notes (Signed)
Lance [name] tolerated IV hydration with magnesium and potassium well without complaints or incident. VSS upon discharge. Nutrition consult completed today. Pt discharged self ambulatory in satisfactory condition

## 2019-07-18 DIAGNOSIS — C040 Malignant neoplasm of anterior floor of mouth: Secondary | ICD-10-CM | POA: Diagnosis not present

## 2019-07-19 ENCOUNTER — Other Ambulatory Visit (HOSPITAL_COMMUNITY): Payer: Medicare Other

## 2019-07-19 ENCOUNTER — Inpatient Hospital Stay (HOSPITAL_COMMUNITY): Payer: Medicare Other

## 2019-07-19 ENCOUNTER — Other Ambulatory Visit: Payer: Self-pay

## 2019-07-19 DIAGNOSIS — C109 Malignant neoplasm of oropharynx, unspecified: Secondary | ICD-10-CM

## 2019-07-19 DIAGNOSIS — G894 Chronic pain syndrome: Secondary | ICD-10-CM | POA: Diagnosis not present

## 2019-07-19 DIAGNOSIS — Z931 Gastrostomy status: Secondary | ICD-10-CM | POA: Diagnosis not present

## 2019-07-19 DIAGNOSIS — K1230 Oral mucositis (ulcerative), unspecified: Secondary | ICD-10-CM | POA: Diagnosis not present

## 2019-07-19 DIAGNOSIS — C040 Malignant neoplasm of anterior floor of mouth: Secondary | ICD-10-CM | POA: Diagnosis not present

## 2019-07-19 DIAGNOSIS — C3490 Malignant neoplasm of unspecified part of unspecified bronchus or lung: Secondary | ICD-10-CM | POA: Diagnosis not present

## 2019-07-19 DIAGNOSIS — C049 Malignant neoplasm of floor of mouth, unspecified: Secondary | ICD-10-CM | POA: Diagnosis not present

## 2019-07-19 DIAGNOSIS — E875 Hyperkalemia: Secondary | ICD-10-CM | POA: Diagnosis not present

## 2019-07-19 DIAGNOSIS — Z5111 Encounter for antineoplastic chemotherapy: Secondary | ICD-10-CM | POA: Diagnosis not present

## 2019-07-19 LAB — CBC WITH DIFFERENTIAL/PLATELET
Abs Immature Granulocytes: 0.01 10*3/uL (ref 0.00–0.07)
Basophils Absolute: 0 10*3/uL (ref 0.0–0.1)
Basophils Relative: 0 %
Eosinophils Absolute: 0 10*3/uL (ref 0.0–0.5)
Eosinophils Relative: 0 %
HCT: 39.4 % (ref 39.0–52.0)
Hemoglobin: 13.1 g/dL (ref 13.0–17.0)
Immature Granulocytes: 0 %
Lymphocytes Relative: 9 %
Lymphs Abs: 0.5 10*3/uL — ABNORMAL LOW (ref 0.7–4.0)
MCH: 29.2 pg (ref 26.0–34.0)
MCHC: 33.2 g/dL (ref 30.0–36.0)
MCV: 87.8 fL (ref 80.0–100.0)
Monocytes Absolute: 0.3 10*3/uL (ref 0.1–1.0)
Monocytes Relative: 6 %
Neutro Abs: 4.7 10*3/uL (ref 1.7–7.7)
Neutrophils Relative %: 85 %
Platelets: 195 10*3/uL (ref 150–400)
RBC: 4.49 MIL/uL (ref 4.22–5.81)
RDW: 16.1 % — ABNORMAL HIGH (ref 11.5–15.5)
WBC: 5.6 10*3/uL (ref 4.0–10.5)
nRBC: 0 % (ref 0.0–0.2)

## 2019-07-19 LAB — COMPREHENSIVE METABOLIC PANEL
ALT: 17 U/L (ref 0–44)
AST: 15 U/L (ref 15–41)
Albumin: 4.3 g/dL (ref 3.5–5.0)
Alkaline Phosphatase: 98 U/L (ref 38–126)
Anion gap: 10 (ref 5–15)
BUN: 21 mg/dL (ref 8–23)
CO2: 30 mmol/L (ref 22–32)
Calcium: 10 mg/dL (ref 8.9–10.3)
Chloride: 94 mmol/L — ABNORMAL LOW (ref 98–111)
Creatinine, Ser: 0.65 mg/dL (ref 0.61–1.24)
GFR calc Af Amer: >60 mL/min (ref >60)
GFR calc non Af Amer: >60 mL/min (ref >60)
Glucose, Bld: 161 mg/dL — ABNORMAL HIGH (ref 70–99)
Potassium: 4.8 mmol/L (ref 3.5–5.1)
Sodium: 134 mmol/L — ABNORMAL LOW (ref 135–145)
Total Bilirubin: 0.8 mg/dL (ref 0.3–1.2)
Total Protein: 7.4 g/dL (ref 6.5–8.1)

## 2019-07-19 LAB — MAGNESIUM: Magnesium: 2 mg/dL (ref 1.7–2.4)

## 2019-07-20 ENCOUNTER — Ambulatory Visit (HOSPITAL_COMMUNITY): Payer: Medicare Other

## 2019-07-20 ENCOUNTER — Other Ambulatory Visit: Payer: Self-pay

## 2019-07-20 ENCOUNTER — Ambulatory Visit (HOSPITAL_COMMUNITY): Payer: Medicare Other | Admitting: Hematology

## 2019-07-20 ENCOUNTER — Inpatient Hospital Stay (HOSPITAL_BASED_OUTPATIENT_CLINIC_OR_DEPARTMENT_OTHER): Payer: Medicare Other | Admitting: Hematology

## 2019-07-20 ENCOUNTER — Inpatient Hospital Stay (HOSPITAL_COMMUNITY): Payer: Medicare Other

## 2019-07-20 VITALS — BP 124/68 | HR 73 | Temp 97.7°F | Resp 18

## 2019-07-20 DIAGNOSIS — K1230 Oral mucositis (ulcerative), unspecified: Secondary | ICD-10-CM | POA: Diagnosis not present

## 2019-07-20 DIAGNOSIS — C049 Malignant neoplasm of floor of mouth, unspecified: Secondary | ICD-10-CM

## 2019-07-20 DIAGNOSIS — C3490 Malignant neoplasm of unspecified part of unspecified bronchus or lung: Secondary | ICD-10-CM | POA: Diagnosis not present

## 2019-07-20 DIAGNOSIS — Z931 Gastrostomy status: Secondary | ICD-10-CM | POA: Diagnosis not present

## 2019-07-20 DIAGNOSIS — Z5111 Encounter for antineoplastic chemotherapy: Secondary | ICD-10-CM | POA: Diagnosis not present

## 2019-07-20 DIAGNOSIS — E875 Hyperkalemia: Secondary | ICD-10-CM | POA: Diagnosis not present

## 2019-07-20 MED ORDER — SODIUM CHLORIDE 0.9 % IV SOLN: Freq: Once | INTRAVENOUS | Status: DC

## 2019-07-20 MED ORDER — SODIUM CHLORIDE 0.9% FLUSH
10.0000 mL | INTRAVENOUS | Status: DC | PRN
  Administered 2019-07-20: 10 mL
  Filled 2019-07-20: qty 10

## 2019-07-20 MED ORDER — POTASSIUM CHLORIDE 2 MEQ/ML IV SOLN
Freq: Once | INTRAVENOUS | Status: AC
  Administered 2019-07-20: 11:00:00 via INTRAVENOUS
  Filled 2019-07-20: qty 10

## 2019-07-20 MED ORDER — HEPARIN SOD (PORK) LOCK FLUSH 100 UNIT/ML IV SOLN
500.0000 [IU] | Freq: Once | INTRAVENOUS | Status: AC | PRN
  Administered 2019-07-20: 500 [IU]

## 2019-07-20 NOTE — Progress Notes (Signed)
Pt presents today for treatment and office visit with Dr. Delton Coombes. VS and labs within parameters for treatment. MAR reviewed.   Per Dr. Delton Coombes. Hold treatment today and give IV hydration/ house wine only. Pt to return tomorrow and Friday for hydration. Per ATravis LPN, " Pt is being held due to the patient's pain level caused from radiation and not being able to drink per mouth pain." Pt has large sores in mouth and is taking medication as prescribed for pain. Pt complains of thick mucous in the back of his throat and unable to swallow well.     Vital signs stable. No complaints at this time. Discharged from clinic ambulatory. F/U with [facility] as scheduled.

## 2019-07-20 NOTE — Patient Instructions (Signed)
[facility] at [facility] Discharge Instructions  You were seen today by Dr. Katragadda. He went over your recent lab results. He will see you back in 1 week for labs and follow up.   Thank you for choosing [facility] at [facility] to provide your oncology and hematology care.  To afford each patient quality time with our provider, please arrive at least 15 minutes before your scheduled appointment time.   If you have a lab appointment with the Cancer Center please come in thru the  Main Entrance and check in at the main information desk  You need to re-schedule your appointment should you arrive 10 or more minutes late.  We strive to give you quality time with our providers, and arriving late affects you and other patients whose appointments are after yours.  Also, if you no show three or more times for appointments you may be dismissed from the clinic at the providers discretion.     Again, thank you for choosing [facility].  Our hope is that these requests will decrease the amount of time that you wait before being seen by our physicians.       _____________________________________________________________  Should you have questions after your visit to [facility], please contact our office at [phone] between the hours of 8:00 a.m. and 4:30 p.m.  Voicemails left after 4:00 p.m. will not be returned until the following business day.  For prescription refill requests, have your pharmacy contact our office and allow 72 hours.    Cancer Center Support Programs:   > Cancer Support Group  2nd Tuesday of the month 1pm-2pm, Journey Room    

## 2019-07-20 NOTE — Patient Instructions (Signed)
[facility] at [facility]  Discharge Instructions:   _______________________________________________________________  Thank you for choosing [facility] at [facility] to provide your oncology and hematology care.  To afford each patient quality time with our providers, please arrive at least 15 minutes before your scheduled appointment.  You need to re-schedule your appointment if you arrive 10 or more minutes late.  We strive to give you quality time with our providers, and arriving late affects you and other patients whose appointments are after yours.  Also, if you no show three or more times for appointments you may be dismissed from the clinic.  Again, thank you for choosing  Cancer Center at  Hospital. Our hope is that these requests will allow you access to exceptional care and in a timely manner. _______________________________________________________________  If you have questions after your visit, please contact our office at [phone] between the hours of 8:30 a.m. and 5:00 p.m. Voicemails left after 4:30 p.m. will not be returned until the following business day. _______________________________________________________________  For prescription refill requests, have your pharmacy contact our office. _______________________________________________________________  Recommendations made by the consultant and any test results will be sent to your referring physician. _______________________________________________________________ 
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
## 2019-07-21 ENCOUNTER — Inpatient Hospital Stay (HOSPITAL_COMMUNITY): Payer: Medicare Other

## 2019-07-21 ENCOUNTER — Encounter (HOSPITAL_COMMUNITY): Payer: Self-pay | Admitting: Hematology

## 2019-07-21 VITALS — BP 122/77 | HR 81 | Temp 97.0°F | Resp 18

## 2019-07-21 DIAGNOSIS — E875 Hyperkalemia: Secondary | ICD-10-CM | POA: Diagnosis not present

## 2019-07-21 DIAGNOSIS — C3490 Malignant neoplasm of unspecified part of unspecified bronchus or lung: Secondary | ICD-10-CM | POA: Diagnosis not present

## 2019-07-21 DIAGNOSIS — K1230 Oral mucositis (ulcerative), unspecified: Secondary | ICD-10-CM | POA: Diagnosis not present

## 2019-07-21 DIAGNOSIS — C049 Malignant neoplasm of floor of mouth, unspecified: Secondary | ICD-10-CM | POA: Diagnosis not present

## 2019-07-21 DIAGNOSIS — Z5111 Encounter for antineoplastic chemotherapy: Secondary | ICD-10-CM | POA: Diagnosis not present

## 2019-07-21 DIAGNOSIS — C040 Malignant neoplasm of anterior floor of mouth: Secondary | ICD-10-CM | POA: Diagnosis not present

## 2019-07-21 DIAGNOSIS — C109 Malignant neoplasm of oropharynx, unspecified: Secondary | ICD-10-CM

## 2019-07-21 DIAGNOSIS — Z931 Gastrostomy status: Secondary | ICD-10-CM | POA: Diagnosis not present

## 2019-07-21 MED ORDER — HEPARIN SOD (PORK) LOCK FLUSH 100 UNIT/ML IV SOLN
500.0000 [IU] | Freq: Once | INTRAVENOUS | Status: AC | PRN
  Administered 2019-07-21: 500 [IU]

## 2019-07-21 MED ORDER — SODIUM CHLORIDE 0.9 % IV SOLN
Freq: Once | INTRAVENOUS | Status: AC
  Administered 2019-07-21: 14:00:00 via INTRAVENOUS
  Filled 2019-07-21: qty 500

## 2019-07-21 MED ORDER — SODIUM CHLORIDE 0.9% FLUSH
10.0000 mL | Freq: Once | INTRAVENOUS | Status: AC | PRN
  Administered 2019-07-21: 10 mL

## 2019-07-21 NOTE — Patient Instructions (Signed)
[facility] at [facility]  Discharge Instructions:   _______________________________________________________________  Thank you for choosing [facility] at [facility] to provide your oncology and hematology care.  To afford each patient quality time with our providers, please arrive at least 15 minutes before your scheduled appointment.  You need to re-schedule your appointment if you arrive 10 or more minutes late.  We strive to give you quality time with our providers, and arriving late affects you and other patients whose appointments are after yours.  Also, if you no show three or more times for appointments you may be dismissed from the clinic.  Again, thank you for choosing [facility] at [facility]. Our hope is that these requests will allow you access to exceptional care and in a timely manner. _______________________________________________________________  If you have questions after your visit, please contact our office at [phone] between the hours of 8:30 a.m. and 5:00 p.m. Voicemails left after 4:30 p.m. will not be returned until the following business day. _______________________________________________________________  For prescription refill requests, have your pharmacy contact our office. _______________________________________________________________  Recommendations made by the consultant and any test results will be sent to your referring physician. _______________________________________________________________ 

## 2019-07-21 NOTE — Assessment & Plan Note (Signed)
1.  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 -Week 5 of cisplatin on 07/13/2019. - He denies any tingling or numbness in the extremities.  No ringing in the ears.  However he has severe mucositis. -He is unable to swallow his sputum.  He is also feeling very weak with no energy levels. - I will hold off on his chemotherapy this week.  I will reevaluate him next week.  2.  Mucositis: - He is using morphine 20 mg/mL, 0.5 mL every 6-8 hours as needed.  3.  Nutrition: -He is not able to eat by mouth at all.  He is taking in 2 cans of Osmolite 3 times a day via PEG tube.  4.  Non-small cell lung cancer: -Navigational bronchoscopy and biopsy by Dr. Roxan Hockey on 05/16/2019 was consistent with non-small cell lung cancer. - He will be considered for SBRT upon completion of chemoradiation therapy for head and neck cancer.

## 2019-07-21 NOTE — Progress Notes (Signed)
Pt presents today for Hydration fluids only. VSS. No complaints of any changes since yesterday. Pain in the mouth remains an 8. See MAR for medication administration.   Hydration  given today per MD orders. Tolerated infusion without adverse affects. Vital signs stable. No complaints at this time. Discharged from clinic ambulatory. F/U with [facility] as scheduled.

## 2019-07-22 ENCOUNTER — Other Ambulatory Visit: Payer: Self-pay

## 2019-07-22 ENCOUNTER — Inpatient Hospital Stay (HOSPITAL_COMMUNITY): Payer: Medicare Other

## 2019-07-22 ENCOUNTER — Encounter (HOSPITAL_COMMUNITY): Payer: Self-pay

## 2019-07-22 VITALS — BP 114/68 | HR 84 | Temp 97.6°F | Resp 18

## 2019-07-22 DIAGNOSIS — Z931 Gastrostomy status: Secondary | ICD-10-CM | POA: Diagnosis not present

## 2019-07-22 DIAGNOSIS — C3490 Malignant neoplasm of unspecified part of unspecified bronchus or lung: Secondary | ICD-10-CM | POA: Diagnosis not present

## 2019-07-22 DIAGNOSIS — Z5111 Encounter for antineoplastic chemotherapy: Secondary | ICD-10-CM | POA: Diagnosis not present

## 2019-07-22 DIAGNOSIS — K1230 Oral mucositis (ulcerative), unspecified: Secondary | ICD-10-CM | POA: Diagnosis not present

## 2019-07-22 DIAGNOSIS — C049 Malignant neoplasm of floor of mouth, unspecified: Secondary | ICD-10-CM | POA: Diagnosis not present

## 2019-07-22 DIAGNOSIS — C109 Malignant neoplasm of oropharynx, unspecified: Secondary | ICD-10-CM

## 2019-07-22 DIAGNOSIS — E875 Hyperkalemia: Secondary | ICD-10-CM | POA: Diagnosis not present

## 2019-07-22 DIAGNOSIS — C040 Malignant neoplasm of anterior floor of mouth: Secondary | ICD-10-CM | POA: Diagnosis not present

## 2019-07-22 MED ORDER — HEPARIN SOD (PORK) LOCK FLUSH 100 UNIT/ML IV SOLN
500.0000 [IU] | Freq: Once | INTRAVENOUS | Status: AC | PRN
  Administered 2019-07-22: 500 [IU]

## 2019-07-22 MED ORDER — SODIUM CHLORIDE 0.9% FLUSH
10.0000 mL | Freq: Once | INTRAVENOUS | Status: AC | PRN
  Administered 2019-07-22: 10 mL

## 2019-07-22 MED ORDER — SODIUM CHLORIDE 0.9 % IV SOLN
Freq: Once | INTRAVENOUS | Status: AC
  Administered 2019-07-22: 11:00:00 via INTRAVENOUS
  Filled 2019-07-22: qty 500

## 2019-07-22 NOTE — Patient Instructions (Signed)
[facility] at [facility] Discharge Instructions  Received IV hydration with magnesium and potassium today. Follow-up as scheduled. Call clinic for any questions or concerns   Thank you for choosing [place] at [facility] to provide your oncology and hematology care.  To afford each patient quality time with our provider, please arrive at least 15 minutes before your scheduled appointment time.   If you have a lab appointment with the [facility] please come in thru the  Main Entrance and check in at the main information desk  You need to re-schedule your appointment should you arrive 10 or more minutes late.  We strive to give you quality time with our providers, and arriving late affects you and other patients whose appointments are after yours.  Also, if you no show three or more times for appointments you may be dismissed from the clinic at the providers discretion.     Again, thank you for choosing [facility].  Our hope is that these requests will decrease the amount of time that you wait before being seen by our physicians.       _____________________________________________________________  Should you have questions after your visit to [facility], please contact our office at [phone] between the hours of 8:00 a.m. and 4:30 p.m.  Voicemails left after 4:00 p.m. will not be returned until the following business day.  For prescription refill requests, have your pharmacy contact our office and allow 72 hours.    Cancer Center Support Programs:   > Cancer Support Group  2nd Tuesday of the month 1pm-2pm, Journey Room

## 2019-07-22 NOTE — Progress Notes (Signed)
Lance [name] tolerated IV hydration with potassium and magnesium well without complaints or incident. VSS upon discharge. Pt discharged self ambulatory in satisfactory condition

## 2019-07-25 DIAGNOSIS — C040 Malignant neoplasm of anterior floor of mouth: Secondary | ICD-10-CM | POA: Diagnosis not present

## 2019-07-26 ENCOUNTER — Other Ambulatory Visit: Payer: Self-pay

## 2019-07-26 ENCOUNTER — Inpatient Hospital Stay (HOSPITAL_COMMUNITY): Payer: Medicare Other

## 2019-07-26 ENCOUNTER — Other Ambulatory Visit (HOSPITAL_COMMUNITY): Payer: Medicare Other

## 2019-07-26 DIAGNOSIS — C049 Malignant neoplasm of floor of mouth, unspecified: Secondary | ICD-10-CM | POA: Diagnosis not present

## 2019-07-26 DIAGNOSIS — Z931 Gastrostomy status: Secondary | ICD-10-CM | POA: Diagnosis not present

## 2019-07-26 DIAGNOSIS — E875 Hyperkalemia: Secondary | ICD-10-CM | POA: Diagnosis not present

## 2019-07-26 DIAGNOSIS — C3490 Malignant neoplasm of unspecified part of unspecified bronchus or lung: Secondary | ICD-10-CM | POA: Diagnosis not present

## 2019-07-26 DIAGNOSIS — Z95828 Presence of other vascular implants and grafts: Secondary | ICD-10-CM

## 2019-07-26 DIAGNOSIS — C109 Malignant neoplasm of oropharynx, unspecified: Secondary | ICD-10-CM

## 2019-07-26 DIAGNOSIS — C040 Malignant neoplasm of anterior floor of mouth: Secondary | ICD-10-CM | POA: Diagnosis not present

## 2019-07-26 DIAGNOSIS — K1230 Oral mucositis (ulcerative), unspecified: Secondary | ICD-10-CM | POA: Diagnosis not present

## 2019-07-26 DIAGNOSIS — Z5111 Encounter for antineoplastic chemotherapy: Secondary | ICD-10-CM | POA: Diagnosis not present

## 2019-07-26 LAB — COMPREHENSIVE METABOLIC PANEL
ALT: 16 U/L (ref 0–44)
AST: 18 U/L (ref 15–41)
Albumin: 4.2 g/dL (ref 3.5–5.0)
Alkaline Phosphatase: 89 U/L (ref 38–126)
Anion gap: 11 (ref 5–15)
BUN: 21 mg/dL (ref 8–23)
CO2: 26 mmol/L (ref 22–32)
Calcium: 9.8 mg/dL (ref 8.9–10.3)
Chloride: 92 mmol/L — ABNORMAL LOW (ref 98–111)
Creatinine, Ser: 0.62 mg/dL (ref 0.61–1.24)
GFR calc Af Amer: >60 mL/min (ref >60)
GFR calc non Af Amer: >60 mL/min (ref >60)
Glucose, Bld: 120 mg/dL — ABNORMAL HIGH (ref 70–99)
Potassium: 4.7 mmol/L (ref 3.5–5.1)
Sodium: 129 mmol/L — ABNORMAL LOW (ref 135–145)
Total Bilirubin: 0.5 mg/dL (ref 0.3–1.2)
Total Protein: 7.4 g/dL (ref 6.5–8.1)

## 2019-07-26 LAB — CBC WITH DIFFERENTIAL/PLATELET
Abs Immature Granulocytes: 0 10*3/uL (ref 0.00–0.07)
Basophils Absolute: 0 10*3/uL (ref 0.0–0.1)
Basophils Relative: 0 %
Eosinophils Absolute: 0 10*3/uL (ref 0.0–0.5)
Eosinophils Relative: 1 %
HCT: 34.8 % — ABNORMAL LOW (ref 39.0–52.0)
Hemoglobin: 11.9 g/dL — ABNORMAL LOW (ref 13.0–17.0)
Immature Granulocytes: 0 %
Lymphocytes Relative: 10 %
Lymphs Abs: 0.5 10*3/uL — ABNORMAL LOW (ref 0.7–4.0)
MCH: 29.8 pg (ref 26.0–34.0)
MCHC: 34.2 g/dL (ref 30.0–36.0)
MCV: 87.2 fL (ref 80.0–100.0)
Monocytes Absolute: 0.7 10*3/uL (ref 0.1–1.0)
Monocytes Relative: 14 %
Neutro Abs: 3.6 10*3/uL (ref 1.7–7.7)
Neutrophils Relative %: 75 %
Platelets: 319 10*3/uL (ref 150–400)
RBC: 3.99 MIL/uL — ABNORMAL LOW (ref 4.22–5.81)
RDW: 17.5 % — ABNORMAL HIGH (ref 11.5–15.5)
WBC: 4.8 10*3/uL (ref 4.0–10.5)
nRBC: 0 % (ref 0.0–0.2)

## 2019-07-27 ENCOUNTER — Inpatient Hospital Stay (HOSPITAL_COMMUNITY): Payer: Medicare Other

## 2019-07-27 ENCOUNTER — Inpatient Hospital Stay (HOSPITAL_BASED_OUTPATIENT_CLINIC_OR_DEPARTMENT_OTHER): Payer: Medicare Other | Admitting: Hematology

## 2019-07-27 ENCOUNTER — Encounter (HOSPITAL_COMMUNITY): Payer: Self-pay

## 2019-07-27 VITALS — BP 124/64 | HR 83 | Resp 16

## 2019-07-27 DIAGNOSIS — C049 Malignant neoplasm of floor of mouth, unspecified: Secondary | ICD-10-CM

## 2019-07-27 DIAGNOSIS — K1230 Oral mucositis (ulcerative), unspecified: Secondary | ICD-10-CM | POA: Diagnosis not present

## 2019-07-27 DIAGNOSIS — Z5111 Encounter for antineoplastic chemotherapy: Secondary | ICD-10-CM | POA: Diagnosis not present

## 2019-07-27 DIAGNOSIS — C040 Malignant neoplasm of anterior floor of mouth: Secondary | ICD-10-CM | POA: Diagnosis not present

## 2019-07-27 DIAGNOSIS — C3490 Malignant neoplasm of unspecified part of unspecified bronchus or lung: Secondary | ICD-10-CM | POA: Diagnosis not present

## 2019-07-27 DIAGNOSIS — Z95828 Presence of other vascular implants and grafts: Secondary | ICD-10-CM

## 2019-07-27 DIAGNOSIS — E875 Hyperkalemia: Secondary | ICD-10-CM | POA: Diagnosis not present

## 2019-07-27 DIAGNOSIS — Z931 Gastrostomy status: Secondary | ICD-10-CM | POA: Diagnosis not present

## 2019-07-27 MED ORDER — SODIUM CHLORIDE 0.9 % IV SOLN
32.0000 mg/m2 | Freq: Once | INTRAVENOUS | Status: AC
  Administered 2019-07-27: 62 mg via INTRAVENOUS
  Filled 2019-07-27: qty 62

## 2019-07-27 MED ORDER — PALONOSETRON HCL INJECTION 0.25 MG/5ML
0.2500 mg | Freq: Once | INTRAVENOUS | Status: AC
  Administered 2019-07-27: 0.25 mg via INTRAVENOUS
  Filled 2019-07-27: qty 5

## 2019-07-27 MED ORDER — SODIUM CHLORIDE 0.9 % IV SOLN
Freq: Once | INTRAVENOUS | Status: AC
  Administered 2019-07-27: 13:00:00 via INTRAVENOUS

## 2019-07-27 MED ORDER — POTASSIUM CHLORIDE 2 MEQ/ML IV SOLN
Freq: Once | INTRAVENOUS | Status: AC
  Administered 2019-07-27: 09:00:00 via INTRAVENOUS
  Filled 2019-07-27: qty 10

## 2019-07-27 MED ORDER — SODIUM CHLORIDE 0.9% FLUSH
10.0000 mL | INTRAVENOUS | Status: DC | PRN
  Administered 2019-07-27: 10 mL
  Filled 2019-07-27: qty 10

## 2019-07-27 MED ORDER — SODIUM CHLORIDE 0.9 % IV SOLN
Freq: Once | INTRAVENOUS | Status: AC
  Administered 2019-07-27: 11:00:00 via INTRAVENOUS

## 2019-07-27 MED ORDER — HEPARIN SOD (PORK) LOCK FLUSH 100 UNIT/ML IV SOLN
500.0000 [IU] | Freq: Once | INTRAVENOUS | Status: AC | PRN
  Administered 2019-07-27: 500 [IU]

## 2019-07-27 MED ORDER — SODIUM CHLORIDE 0.9 % IV SOLN
Freq: Once | INTRAVENOUS | Status: AC
  Administered 2019-07-27: 11:00:00 via INTRAVENOUS
  Filled 2019-07-27: qty 5

## 2019-07-27 NOTE — Patient Instructions (Signed)
[facility] Discharge Instructions for Patients Receiving Chemotherapy  Today you received the following chemotherapy agents   To help prevent nausea and vomiting after your treatment, we encourage you to take your nausea medication   If you develop nausea and vomiting that is not controlled by your nausea medication, call the clinic.   BELOW ARE SYMPTOMS THAT SHOULD BE REPORTED IMMEDIATELY:  *FEVER GREATER THAN 100.5 F  *CHILLS WITH OR WITHOUT FEVER  NAUSEA AND VOMITING THAT IS NOT CONTROLLED WITH YOUR NAUSEA MEDICATION  *UNUSUAL SHORTNESS OF BREATH  *UNUSUAL BRUISING OR BLEEDING  TENDERNESS IN MOUTH AND THROAT WITH OR WITHOUT PRESENCE OF ULCERS  *URINARY PROBLEMS  *BOWEL PROBLEMS  UNUSUAL RASH Items with * indicate a potential emergency and should be followed up as soon as possible.  Feel free to call the clinic should you have any questions or concerns. The clinic phone number is [phone].  Please show the CHEMO ALERT CARD at check-in to the Emergency Department and triage nurse.   
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
## 2019-07-27 NOTE — Patient Instructions (Addendum)
[facility] at [facility] Discharge Instructions  You were seen today by Dr. Katragadda. He went over your recent lab results. He will see you back in 1 week for labs and follow up.   Thank you for choosing [facility] at [facility] to provide your oncology and hematology care.  To afford each patient quality time with our provider, please arrive at least 15 minutes before your scheduled appointment time.   If you have a lab appointment with the Cancer Center please come in thru the  Main Entrance and check in at the main information desk  You need to re-schedule your appointment should you arrive 10 or more minutes late.  We strive to give you quality time with our providers, and arriving late affects you and other patients whose appointments are after yours.  Also, if you no show three or more times for appointments you may be dismissed from the clinic at the providers discretion.     Again, thank you for choosing [facility].  Our hope is that these requests will decrease the amount of time that you wait before being seen by our physicians.       _____________________________________________________________  Should you have questions after your visit to [facility], please contact our office at [phone] between the hours of 8:00 a.m. and 4:30 p.m.  Voicemails left after 4:00 p.m. will not be returned until the following business day.  For prescription refill requests, have your pharmacy contact our office and allow 72 hours.    Cancer Center Support Programs:   > Cancer Support Group  2nd Tuesday of the month 1pm-2pm, Journey Room    

## 2019-07-27 NOTE — Progress Notes (Signed)
Labs reviewed with MD today. Will proceed today and this is his last treatment. He will come next week for hydration fluids as well per MD.   Treatment given per orders. Patient tolerated it well without problems. Vitals stable and discharged home from clinic ambulatory. Follow up as scheduled.

## 2019-07-28 ENCOUNTER — Other Ambulatory Visit: Payer: Self-pay

## 2019-07-28 ENCOUNTER — Inpatient Hospital Stay (HOSPITAL_COMMUNITY): Payer: Medicare Other

## 2019-07-28 ENCOUNTER — Encounter (HOSPITAL_COMMUNITY): Payer: Self-pay | Admitting: Hematology

## 2019-07-28 VITALS — BP 117/66 | HR 80 | Temp 97.9°F | Resp 18

## 2019-07-28 DIAGNOSIS — K1230 Oral mucositis (ulcerative), unspecified: Secondary | ICD-10-CM | POA: Diagnosis not present

## 2019-07-28 DIAGNOSIS — Z5111 Encounter for antineoplastic chemotherapy: Secondary | ICD-10-CM | POA: Diagnosis not present

## 2019-07-28 DIAGNOSIS — C3490 Malignant neoplasm of unspecified part of unspecified bronchus or lung: Secondary | ICD-10-CM | POA: Diagnosis not present

## 2019-07-28 DIAGNOSIS — Z931 Gastrostomy status: Secondary | ICD-10-CM | POA: Diagnosis not present

## 2019-07-28 DIAGNOSIS — E875 Hyperkalemia: Secondary | ICD-10-CM | POA: Diagnosis not present

## 2019-07-28 DIAGNOSIS — C049 Malignant neoplasm of floor of mouth, unspecified: Secondary | ICD-10-CM | POA: Diagnosis not present

## 2019-07-28 DIAGNOSIS — C109 Malignant neoplasm of oropharynx, unspecified: Secondary | ICD-10-CM

## 2019-07-28 DIAGNOSIS — C040 Malignant neoplasm of anterior floor of mouth: Secondary | ICD-10-CM | POA: Diagnosis not present

## 2019-07-28 MED ORDER — SODIUM CHLORIDE 0.9 % IV SOLN
Freq: Once | INTRAVENOUS | Status: AC
  Administered 2019-07-28: 14:00:00 via INTRAVENOUS
  Filled 2019-07-28: qty 500

## 2019-07-28 MED ORDER — SODIUM CHLORIDE 0.9% FLUSH
10.0000 mL | Freq: Once | INTRAVENOUS | Status: AC | PRN
  Administered 2019-07-28: 10 mL

## 2019-07-28 MED ORDER — HEPARIN SOD (PORK) LOCK FLUSH 100 UNIT/ML IV SOLN
500.0000 [IU] | Freq: Once | INTRAVENOUS | Status: AC | PRN
  Administered 2019-07-28: 500 [IU]

## 2019-07-28 NOTE — Progress Notes (Signed)
Pt presents today for fluids only. VSS. Pt has no complaints of any changes since his last visit.   IV fluids given today per MD orders. Tolerated infusion without adverse affects. Vital signs stable. No complaints at this time. Discharged from clinic ambulatory. F/U with [facility] as scheduled.

## 2019-07-28 NOTE — Assessment & Plan Note (Signed)
1.  Stage IVa (pT4 APN 1) squamous cell carcinoma of the floor of the mouth: -Weekly cisplatin and radiation started on 06/15/2019. - Week 5 of cisplatin on 07/13/2019. - He is continuing to have difficulty swallowing his sputum.  He is swishing his mouth with mixture of water, baking soda and salt which is helping loosen the secretions. - He was reportedly given Diflucan by Dr.Yanagihara. -We reviewed labs.  He will finish his last radiation on 08/02/2019.  Will proceed with his last cycle of chemotherapy today.  2.  Mucositis: -He is using morphine 20 mg/ML, 0.5 mill every 6-8 hours as needed.  3.  Nutrition: - He is taking in 2 cans of Osmolite 3 times a day via PEG tube.  He is not able to eat by mouth at all.  4.  Non-small cell lung cancer: - Navigational bronchoscopy and biopsy by Dr. Roxan Hockey on 05/16/2019 was consistent with non-small cell lung cancer. -He will be considered for SBRT upon completion of chemoradiation therapy for head and neck cancer.

## 2019-07-29 ENCOUNTER — Inpatient Hospital Stay (HOSPITAL_COMMUNITY): Payer: Medicare Other

## 2019-07-29 VITALS — BP 125/68 | HR 74 | Temp 97.8°F | Resp 18

## 2019-07-29 DIAGNOSIS — C3490 Malignant neoplasm of unspecified part of unspecified bronchus or lung: Secondary | ICD-10-CM | POA: Diagnosis not present

## 2019-07-29 DIAGNOSIS — C040 Malignant neoplasm of anterior floor of mouth: Secondary | ICD-10-CM | POA: Diagnosis not present

## 2019-07-29 DIAGNOSIS — C049 Malignant neoplasm of floor of mouth, unspecified: Secondary | ICD-10-CM | POA: Diagnosis not present

## 2019-07-29 DIAGNOSIS — Z931 Gastrostomy status: Secondary | ICD-10-CM | POA: Diagnosis not present

## 2019-07-29 DIAGNOSIS — Z5111 Encounter for antineoplastic chemotherapy: Secondary | ICD-10-CM | POA: Diagnosis not present

## 2019-07-29 DIAGNOSIS — K1230 Oral mucositis (ulcerative), unspecified: Secondary | ICD-10-CM | POA: Diagnosis not present

## 2019-07-29 DIAGNOSIS — C109 Malignant neoplasm of oropharynx, unspecified: Secondary | ICD-10-CM

## 2019-07-29 DIAGNOSIS — E875 Hyperkalemia: Secondary | ICD-10-CM | POA: Diagnosis not present

## 2019-07-29 MED ORDER — GLUCERNA 1.5 CAL PO LIQD: ORAL | 2 refills | Status: DC

## 2019-07-29 MED ORDER — SODIUM CHLORIDE 0.9% FLUSH
10.0000 mL | Freq: Once | INTRAVENOUS | Status: AC | PRN
  Administered 2019-07-29: 10 mL

## 2019-07-29 MED ORDER — HEPARIN SOD (PORK) LOCK FLUSH 100 UNIT/ML IV SOLN
500.0000 [IU] | Freq: Once | INTRAVENOUS | Status: AC | PRN
  Administered 2019-07-29: 12:00:00 500 [IU]

## 2019-07-29 MED ORDER — SODIUM CHLORIDE 0.9 % IV SOLN
Freq: Once | INTRAVENOUS | Status: AC
  Administered 2019-07-29: 11:00:00 via INTRAVENOUS
  Filled 2019-07-29: qty 500

## 2019-07-29 NOTE — Progress Notes (Signed)
IV fluids given today per MD orders. Tolerated infusion without adverse affects. Vital signs stable. No complaints at this time. Discharged from clinic ambulatory. F/U with [facility] as scheduled.

## 2019-07-29 NOTE — Progress Notes (Signed)
Nutrition Follow-up:  Patient with stage IV SCC of mouth.  Patient received last chemotherapy yesterday.  Radiation due to end next week.  Patient s/p surgical resection at [facility] on 02/25/19  Met with patient in infusion, receiving IV fluids.  Patient reports that he is not able to take anything by mouth.  Reports that he is taking 6 carton of glucerna 1.5 (2 carton TID).  Reports that he is flushing with 2 syringes (15m) before and 2-3 after (120-1875m.  Denies any tolerance issues with feeding.      Medications: reviewed  Labs: reviewed  Anthropometrics:   Weight 145 lb on 8/19 decreased from 150 lb 3.2 oz on 8/5.     Estimated Energy Needs  Kcals: 2000-2400 Protein: 100-120 g Fluid: 2.4 L  NUTRITION DIAGNOSIS: Inadequate oral intake continues relying on PEG   INTERVENTION:  Recommend patient increase glucerna 1.5 to 7 cartons per day (2 cartons TID and 1 carton daily).  Flush with 12052mater before and 180m66mter.  Written prescription given to patient.  Patient reports that he is getting low on formula and has not heard from [facility].  Gave patient phone number to call and order more formula.  RD also emailed [employer] out to patient and to notify of increase in formula.  Tube regimen will provide 2492 calories, 136 g protein and 2460ml53me water (from flush and formula)     MONITORING, EVALUATION, GOAL:  Patient will consume adequate calories via feeding tube to maintain weight  NEXT VISIT: phone f/u August 28th  Joli B. AllenZenia [place] Dietitian [phone])

## 2019-07-29 NOTE — Patient Instructions (Signed)
[facility] at [facility]  Discharge Instructions:   _______________________________________________________________  Thank you for choosing [facility] at [facility] to provide your oncology and hematology care.  To afford each patient quality time with our providers, please arrive at least 15 minutes before your scheduled appointment.  You need to re-schedule your appointment if you arrive 10 or more minutes late.  We strive to give you quality time with our providers, and arriving late affects you and other patients whose appointments are after yours.  Also, if you no show three or more times for appointments you may be dismissed from the clinic.  Again, thank you for choosing [facility] at [facility]. Our hope is that these requests will allow you access to exceptional care and in a timely manner. _______________________________________________________________  If you have questions after your visit, please contact our office at [phone] between the hours of 8:30 a.m. and 5:00 p.m. Voicemails left after 4:30 p.m. will not be returned until the following business day. _______________________________________________________________  For prescription refill requests, have your pharmacy contact our office. _______________________________________________________________  Recommendations made by the consultant and any test results will be sent to your referring physician. _______________________________________________________________ 

## 2019-08-03 ENCOUNTER — Inpatient Hospital Stay (HOSPITAL_BASED_OUTPATIENT_CLINIC_OR_DEPARTMENT_OTHER): Payer: Medicare Other | Admitting: Hematology

## 2019-08-03 ENCOUNTER — Other Ambulatory Visit: Payer: Self-pay

## 2019-08-03 ENCOUNTER — Inpatient Hospital Stay (HOSPITAL_COMMUNITY): Payer: Medicare Other

## 2019-08-03 ENCOUNTER — Encounter (HOSPITAL_COMMUNITY): Payer: Self-pay | Admitting: Hematology

## 2019-08-03 VITALS — BP 144/67 | HR 75 | Temp 97.7°F | Resp 18

## 2019-08-03 DIAGNOSIS — C049 Malignant neoplasm of floor of mouth, unspecified: Secondary | ICD-10-CM | POA: Diagnosis not present

## 2019-08-03 DIAGNOSIS — Z5111 Encounter for antineoplastic chemotherapy: Secondary | ICD-10-CM | POA: Diagnosis not present

## 2019-08-03 DIAGNOSIS — E875 Hyperkalemia: Secondary | ICD-10-CM | POA: Diagnosis not present

## 2019-08-03 DIAGNOSIS — C3490 Malignant neoplasm of unspecified part of unspecified bronchus or lung: Secondary | ICD-10-CM | POA: Diagnosis not present

## 2019-08-03 DIAGNOSIS — C109 Malignant neoplasm of oropharynx, unspecified: Secondary | ICD-10-CM

## 2019-08-03 DIAGNOSIS — K1230 Oral mucositis (ulcerative), unspecified: Secondary | ICD-10-CM | POA: Diagnosis not present

## 2019-08-03 DIAGNOSIS — Z95828 Presence of other vascular implants and grafts: Secondary | ICD-10-CM

## 2019-08-03 DIAGNOSIS — Z931 Gastrostomy status: Secondary | ICD-10-CM | POA: Diagnosis not present

## 2019-08-03 LAB — COMPREHENSIVE METABOLIC PANEL
ALT: 16 U/L (ref 0–44)
AST: 17 U/L (ref 15–41)
Albumin: 3.9 g/dL (ref 3.5–5.0)
Alkaline Phosphatase: 107 U/L (ref 38–126)
Anion gap: 12 (ref 5–15)
BUN: 20 mg/dL (ref 8–23)
CO2: 24 mmol/L (ref 22–32)
Calcium: 9.8 mg/dL (ref 8.9–10.3)
Chloride: 94 mmol/L — ABNORMAL LOW (ref 98–111)
Creatinine, Ser: 0.59 mg/dL — ABNORMAL LOW (ref 0.61–1.24)
GFR calc Af Amer: >60 mL/min (ref >60)
GFR calc non Af Amer: >60 mL/min (ref >60)
Glucose, Bld: 143 mg/dL — ABNORMAL HIGH (ref 70–99)
Potassium: 5.4 mmol/L — ABNORMAL HIGH (ref 3.5–5.1)
Sodium: 130 mmol/L — ABNORMAL LOW (ref 135–145)
Total Bilirubin: 0.2 mg/dL — ABNORMAL LOW (ref 0.3–1.2)
Total Protein: 7.1 g/dL (ref 6.5–8.1)

## 2019-08-03 LAB — CBC WITH DIFFERENTIAL/PLATELET
Abs Immature Granulocytes: 0.24 10*3/uL — ABNORMAL HIGH (ref 0.00–0.07)
Basophils Absolute: 0 10*3/uL (ref 0.0–0.1)
Basophils Relative: 0 %
Eosinophils Absolute: 0 10*3/uL (ref 0.0–0.5)
Eosinophils Relative: 0 %
HCT: 35.4 % — ABNORMAL LOW (ref 39.0–52.0)
Hemoglobin: 11.8 g/dL — ABNORMAL LOW (ref 13.0–17.0)
Immature Granulocytes: 2 %
Lymphocytes Relative: 5 %
Lymphs Abs: 0.6 10*3/uL — ABNORMAL LOW (ref 0.7–4.0)
MCH: 29.7 pg (ref 26.0–34.0)
MCHC: 33.3 g/dL (ref 30.0–36.0)
MCV: 89.2 fL (ref 80.0–100.0)
Monocytes Absolute: 0.9 10*3/uL (ref 0.1–1.0)
Monocytes Relative: 8 %
Neutro Abs: 10.3 10*3/uL — ABNORMAL HIGH (ref 1.7–7.7)
Neutrophils Relative %: 85 %
Platelets: 313 10*3/uL (ref 150–400)
RBC: 3.97 MIL/uL — ABNORMAL LOW (ref 4.22–5.81)
RDW: 18.3 % — ABNORMAL HIGH (ref 11.5–15.5)
WBC: 12.2 10*3/uL — ABNORMAL HIGH (ref 4.0–10.5)
nRBC: 0 % (ref 0.0–0.2)

## 2019-08-03 MED ORDER — HEPARIN SOD (PORK) LOCK FLUSH 100 UNIT/ML IV SOLN
500.0000 [IU] | Freq: Once | INTRAVENOUS | Status: AC
  Administered 2019-08-03: 500 [IU] via INTRAVENOUS

## 2019-08-03 MED ORDER — SODIUM CHLORIDE 0.9 % IV SOLN
Freq: Once | INTRAVENOUS | Status: AC
  Administered 2019-08-03: 10:00:00 via INTRAVENOUS

## 2019-08-03 MED ORDER — MORPHINE SULFATE (CONCENTRATE) 20 MG/ML PO SOLN: 10.0000 mg | Freq: Three times a day (TID) | ORAL | 0 refills | Status: DC | PRN

## 2019-08-03 MED ORDER — SODIUM CHLORIDE 0.9% FLUSH
10.0000 mL | Freq: Once | INTRAVENOUS | Status: AC
  Administered 2019-08-03: 10 mL via INTRAVENOUS
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
## 2019-08-03 NOTE — Progress Notes (Signed)
Patient tolerated hydration with no complaints voiced.  Port site clean and dry with good blood return noted before and after hydration.  No bruising or swelling noted with port.  Band aid applied.  VSS with discharge and left ambulatory with no s/s of distress noted.

## 2019-08-03 NOTE — Assessment & Plan Note (Signed)
1.  Stage IVa (pT4 APN 1) squamous cell carcinoma of the floor of the mouth: -Weekly cisplatin and radiation from 06/15/2019 through 07/27/2019. - He has persistent oral thrush.  Dr.Yanagihara has given him Diflucan which she is taking daily.  He still has some thrush. - He has difficulty swallowing his sputum.  He has mild hyperkalemia 5.4 today. -We will give him 1 L of normal saline.  2.  Mucositis: -He is using morphine liquid every 6-8 hours.  We will give him a refill.  3.  Nutrition: - He is taking in total 7 cans of Osmolite starting last Friday.  He lost about 2 to 3 pounds.  He is not able to eat by mouth at all.  4.  Non-small cell lung cancer: -Navigational bronchoscopy and biopsy by Dr. Roxan Hockey on 05/16/2019 was consistent with non-small cell lung cancer. -He will be considered for SBRT upon completion of chemoradiation therapy.

## 2019-08-03 NOTE — Patient Instructions (Addendum)
[facility] at [facility] Discharge Instructions  You were seen today by Dr. Katragadda. He went over your recent lab results. He will see you back in 1 week for labs and follow up.   Thank you for choosing [facility] at [facility] to provide your oncology and hematology care.  To afford each patient quality time with our provider, please arrive at least 15 minutes before your scheduled appointment time.   If you have a lab appointment with the Cancer Center please come in thru the  Main Entrance and check in at the main information desk  You need to re-schedule your appointment should you arrive 10 or more minutes late.  We strive to give you quality time with our providers, and arriving late affects you and other patients whose appointments are after yours.  Also, if you no show three or more times for appointments you may be dismissed from the clinic at the providers discretion.     Again, thank you for choosing [facility].  Our hope is that these requests will decrease the amount of time that you wait before being seen by our physicians.       _____________________________________________________________  Should you have questions after your visit to [facility], please contact our office at [phone] between the hours of 8:00 a.m. and 4:30 p.m.  Voicemails left after 4:00 p.m. will not be returned until the following business day.  For prescription refill requests, have your pharmacy contact our office and allow 72 hours.    Cancer Center Support Programs:   > Cancer Support Group  2nd Tuesday of the month 1pm-2pm, Journey Room    

## 2019-08-05 ENCOUNTER — Encounter (HOSPITAL_COMMUNITY): Payer: Medicare Other

## 2019-08-05 ENCOUNTER — Telehealth (HOSPITAL_COMMUNITY): Payer: Self-pay

## 2019-08-05 ENCOUNTER — Ambulatory Visit (HOSPITAL_COMMUNITY): Payer: Medicare Other

## 2019-08-05 NOTE — Telephone Encounter (Addendum)
Nutrition Follow-up:  Patient with stage IV SCC of mouth.  Patient has completed chemotherapy and radiation.  Patient s/p surgical resection on 02/2019.  Called patient again this pm as no return phone call from message left earlier today.    Patient reports that he is taking 7 cartons of glucerna 1.5 (2 carton TID, 1 carton daily).  Reports that he is flushing with "pitcher" of water pouring 1/2 in before and the other half after.  Reports that pitcher is 1000cc.  Feels full with water and formula.  Reports bowel movement every other day but then may have 2 bowel movements in 1 day.  No nausea.  Mouth burns even with trying to drink water.  Not eating or drinking orally.    Noted patient received fluids this week in clinic.   Medications: reviewed  Labs: Na 130, K 5.4, glucose 143, BUN 20, creatinine 0.59  Anthropometrics:   Weight 143 lb 11.2 oz decreased from 145 lb on 8/19.  150 lb 3.2 oz on 8/5   Estimated Energy Needs  Kcals: 2000-2400 Protein: 100-120 g Fluid: 2.4 L  NUTRITION DIAGNOSIS: Inadequate oral intake continues relying on PEG   INTERVENTION:  Recommend patient continue with glucerna 1.5, 7 carton daily (2 cartons TID and 1 carton dailyl).  Asked patient to cut back water to 1 cup (267ml) before and 1 cup after.  Still has not received shipment of tube feeding, has few days left.   RD spoke with [facility] and order was processed on 8/26 so pt should receive shipment today or tomorrow.  Notified patient.     MONITORING, EVALUATION, GOAL: patient will consume adequate calorie via feeding tube to maintain weight   NEXT VISIT: phone f/u Sept 4  Joli B. Zenia Resides, [place], [place] Registered Dietitian [phone] (pager)

## 2019-08-05 NOTE — Progress Notes (Signed)
Nutrition  Called patient for nutrition follow-up. No answer.  Left message for patient to return call.  Joli B. Zenia Resides, [place], [place] Registered Dietitian [phone] (pager)

## 2019-08-09 NOTE — Telephone Encounter (Signed)
Hi Patty, could you please follow up with Dr. Ardis Hughs regarding this referral? We still have this referral open so we would like to know if patient needs EUS or not. Thank you.

## 2019-08-09 NOTE — Telephone Encounter (Signed)
You can close the referral.  Dr Raliegh Ip is suppose to contact us after further testing if EUS needed

## 2019-08-11 ENCOUNTER — Ambulatory Visit (HOSPITAL_COMMUNITY): Payer: Medicare Other | Admitting: Hematology

## 2019-08-11 ENCOUNTER — Inpatient Hospital Stay (HOSPITAL_COMMUNITY): Payer: Medicare Other | Attending: Hematology

## 2019-08-11 ENCOUNTER — Ambulatory Visit (HOSPITAL_COMMUNITY): Payer: Medicare Other

## 2019-08-11 DIAGNOSIS — K1230 Oral mucositis (ulcerative), unspecified: Secondary | ICD-10-CM | POA: Insufficient documentation

## 2019-08-11 DIAGNOSIS — C3490 Malignant neoplasm of unspecified part of unspecified bronchus or lung: Secondary | ICD-10-CM | POA: Insufficient documentation

## 2019-08-11 DIAGNOSIS — C049 Malignant neoplasm of floor of mouth, unspecified: Secondary | ICD-10-CM | POA: Insufficient documentation

## 2019-08-12 ENCOUNTER — Ambulatory Visit (HOSPITAL_COMMUNITY): Payer: Medicare Other
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
## 2019-08-17 DIAGNOSIS — G894 Chronic pain syndrome: Secondary | ICD-10-CM | POA: Diagnosis not present

## 2019-08-19 ENCOUNTER — Encounter (HOSPITAL_COMMUNITY): Payer: Medicare Other

## 2019-08-22 ENCOUNTER — Encounter (HOSPITAL_COMMUNITY): Payer: Self-pay | Admitting: Hematology

## 2019-08-22 ENCOUNTER — Inpatient Hospital Stay (HOSPITAL_BASED_OUTPATIENT_CLINIC_OR_DEPARTMENT_OTHER): Payer: Medicare Other | Admitting: Hematology

## 2019-08-22 ENCOUNTER — Inpatient Hospital Stay (HOSPITAL_COMMUNITY): Payer: Medicare Other | Attending: Hematology

## 2019-08-22 ENCOUNTER — Other Ambulatory Visit: Payer: Self-pay

## 2019-08-22 VITALS — BP 112/63 | HR 97 | Temp 97.5°F | Resp 16 | Wt 147.6 lb

## 2019-08-22 DIAGNOSIS — C049 Malignant neoplasm of floor of mouth, unspecified: Secondary | ICD-10-CM | POA: Diagnosis not present

## 2019-08-22 DIAGNOSIS — Z95828 Presence of other vascular implants and grafts: Secondary | ICD-10-CM

## 2019-08-22 DIAGNOSIS — C109 Malignant neoplasm of oropharynx, unspecified: Secondary | ICD-10-CM

## 2019-08-22 DIAGNOSIS — C3490 Malignant neoplasm of unspecified part of unspecified bronchus or lung: Secondary | ICD-10-CM | POA: Diagnosis not present

## 2019-08-22 DIAGNOSIS — K1230 Oral mucositis (ulcerative), unspecified: Secondary | ICD-10-CM | POA: Diagnosis not present

## 2019-08-22 LAB — CBC WITH DIFFERENTIAL/PLATELET
Abs Immature Granulocytes: 0.03 10*3/uL (ref 0.00–0.07)
Basophils Absolute: 0 10*3/uL (ref 0.0–0.1)
Basophils Relative: 1 %
Eosinophils Absolute: 1.3 10*3/uL — ABNORMAL HIGH (ref 0.0–0.5)
Eosinophils Relative: 17 %
HCT: 36.9 % — ABNORMAL LOW (ref 39.0–52.0)
Hemoglobin: 11.7 g/dL — ABNORMAL LOW (ref 13.0–17.0)
Immature Granulocytes: 0 %
Lymphocytes Relative: 7 %
Lymphs Abs: 0.6 10*3/uL — ABNORMAL LOW (ref 0.7–4.0)
MCH: 31 pg (ref 26.0–34.0)
MCHC: 31.7 g/dL (ref 30.0–36.0)
MCV: 97.6 fL (ref 80.0–100.0)
Monocytes Absolute: 0.4 10*3/uL (ref 0.1–1.0)
Monocytes Relative: 6 %
Neutro Abs: 5.1 10*3/uL (ref 1.7–7.7)
Neutrophils Relative %: 69 %
Platelets: 158 10*3/uL (ref 150–400)
RBC: 3.78 MIL/uL — ABNORMAL LOW (ref 4.22–5.81)
RDW: 19.7 % — ABNORMAL HIGH (ref 11.5–15.5)
WBC: 7.4 10*3/uL (ref 4.0–10.5)
nRBC: 0 % (ref 0.0–0.2)

## 2019-08-22 LAB — COMPREHENSIVE METABOLIC PANEL
ALT: 14 U/L (ref 0–44)
AST: 15 U/L (ref 15–41)
Albumin: 4.1 g/dL (ref 3.5–5.0)
Alkaline Phosphatase: 99 U/L (ref 38–126)
Anion gap: 11 (ref 5–15)
BUN: 20 mg/dL (ref 8–23)
CO2: 26 mmol/L (ref 22–32)
Calcium: 9.7 mg/dL (ref 8.9–10.3)
Chloride: 98 mmol/L (ref 98–111)
Creatinine, Ser: 0.57 mg/dL — ABNORMAL LOW (ref 0.61–1.24)
GFR calc Af Amer: >60 mL/min (ref >60)
GFR calc non Af Amer: >60 mL/min (ref >60)
Glucose, Bld: 189 mg/dL — ABNORMAL HIGH (ref 70–99)
Potassium: 4.6 mmol/L (ref 3.5–5.1)
Sodium: 135 mmol/L (ref 135–145)
Total Bilirubin: 0.3 mg/dL (ref 0.3–1.2)
Total Protein: 6.9 g/dL (ref 6.5–8.1)

## 2019-08-22 NOTE — Assessment & Plan Note (Signed)
1.  Stage IVa (PT4APN1) squamous cell carcinoma of the floor of the mouth: -Weekly cisplatin and radiation from 06/15/2019 through 07/27/2019. - He started eating some foods in the last few days.  He is continuing to gain weight. - His mucositis is also improving. -I have recommended doing a PET CT scan in 4 weeks.  2.  Nutrition: -He is taking and 7 cans of Osmolite daily. -He is also eating by mouth, soft foods like pudding and soups.  He will be in touch with our dietitian to slowly taper down tube feeds as his eating improves.  3.  Non-small cell lung cancer: -Navigational bronchoscopy and biopsy by Dr. Roxan Hockey on 05/16/2019 was consistent with non-small cell lung cancer. - I will obtain PET CT scan in 4 weeks.  He will be considered for SBRT.
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
## 2019-08-22 NOTE — Patient Instructions (Signed)
[place] at [facility] Discharge Instructions  You were seen today by Dr. Delton Coombes. He went over your recent lab results. He will see you back in 5 weeks for labs and follow up.   Thank you for choosing [place] at [facility] to provide your oncology and hematology care.  To afford each patient quality time with our provider, please arrive at least 15 minutes before your scheduled appointment time.   If you have a lab appointment with the [facility] please come in thru the  Main Entrance and check in at the main information desk  You need to re-schedule your appointment should you arrive 10 or more minutes late.  We strive to give you quality time with our providers, and arriving late affects you and other patients whose appointments are after yours.  Also, if you no show three or more times for appointments you may be dismissed from the clinic at the providers discretion.     Again, thank you for choosing [facility].  Our hope is that these requests will decrease the amount of time that you wait before being seen by our physicians.       _____________________________________________________________  Should you have questions after your visit to [facility], please contact our office at [phone] between the hours of 8:00 a.m. and 4:30 p.m.  Voicemails left after 4:00 p.m. will not be returned until the following business day.  For prescription refill requests, have your pharmacy contact our office and allow 72 hours.    Cancer Center Support Programs:   > Cancer Support Group  2nd Tuesday of the month 1pm-2pm, Journey Room

## 2019-08-26 ENCOUNTER — Ambulatory Visit (HOSPITAL_COMMUNITY): Payer: Medicare Other

## 2019-08-26 NOTE — Progress Notes (Signed)
Nutrition Follow-up:  Patient with stage IV SCC of the mouth.  Patient has completed radiation and chemotherapy.  Patient s/p surgical resection on 02/2019.    Spoke with patient this pm.  Patient reports that he continues giving 7 cartons of glucerna 1.5 via tube.  Reports that he is eating more orally. He ate biscuit and gravy last night for dinner along with mashed potatoes and gravy and few bites of chicken (hard to eat).  Reports that he could taste the gravy.  Reports that he is drinking about 1 big glass of fluid per day orally.  Also eating some pudding, soups.  Really wants a whopper.    Denies any problems with tolerating glucerna  Medications: reviewed  Labs: reviewed  Anthropometrics:   Weight 147 lb (9/14) increased from 143 lb.   Estimated Energy Needs  Kcals: 2000-2400 Protein: 100-120 g Fluid: 2.4 L  NUTRITION DIAGNOSIS: Inadequate oral intake improving but still relying on tube feeding   INTERVENTION:  Recommend patient decrease to 6 cartons of glucerna 1.5 per day (2 cartons TID).  Continue water flush 274ml before and after each feeding.  Patient to continue eating orally soft, moist high calorie foods.   Recommend patient weigh himself weekly during time period that tube feeding is being tapered.  Offered to be weighed at [facility] or purchase home scale.  Patient planning to purchase home scale and record weight.   Patient has contact information    MONITORING, EVALUATION, GOAL: Patient will consume adequate calories and protein to maintain weight   NEXT VISIT: phone f/u Sept 25  Joli B. Zenia Resides, [place], [place] Registered Dietitian [phone] (pager)

## 2019-09-02 ENCOUNTER — Ambulatory Visit (HOSPITAL_COMMUNITY): Payer: Medicare Other
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
## 2019-09-05 ENCOUNTER — Other Ambulatory Visit (HOSPITAL_COMMUNITY): Payer: Self-pay | Admitting: *Deleted

## 2019-09-05 DIAGNOSIS — C109 Malignant neoplasm of oropharynx, unspecified: Secondary | ICD-10-CM

## 2019-09-07 DIAGNOSIS — C049 Malignant neoplasm of floor of mouth, unspecified: Secondary | ICD-10-CM | POA: Diagnosis not present

## 2019-09-07 DIAGNOSIS — R29898 Other symptoms and signs involving the musculoskeletal system: Secondary | ICD-10-CM | POA: Diagnosis not present

## 2019-09-09 ENCOUNTER — Ambulatory Visit (HOSPITAL_COMMUNITY): Payer: Medicare Other

## 2019-09-09 NOTE — Progress Notes (Signed)
Nutrition  Called for nutrition follow-up this am. Spoke with significant other and reports patient had gone to see a friend encouraged RD to call back this afternoon.    RD called back this pm and no answer.  Left message on voicemail with call back number.   Joli B. Zenia Resides, [place], [place] Registered Dietitian [phone] (pager)

## 2019-09-12 ENCOUNTER — Other Ambulatory Visit (HOSPITAL_COMMUNITY): Payer: Self-pay | Admitting: *Deleted

## 2019-09-12 DIAGNOSIS — C109 Malignant neoplasm of oropharynx, unspecified: Secondary | ICD-10-CM

## 2019-09-13 DIAGNOSIS — M48062 Spinal stenosis, lumbar region with neurogenic claudication: Secondary | ICD-10-CM | POA: Diagnosis not present

## 2019-09-13 DIAGNOSIS — G894 Chronic pain syndrome: Secondary | ICD-10-CM | POA: Diagnosis not present

## 2019-09-13 DIAGNOSIS — M792 Neuralgia and neuritis, unspecified: Secondary | ICD-10-CM | POA: Diagnosis not present

## 2019-09-13 DIAGNOSIS — G8929 Other chronic pain: Secondary | ICD-10-CM | POA: Diagnosis not present

## 2019-09-13 DIAGNOSIS — M47897 Other spondylosis, lumbosacral region: Secondary | ICD-10-CM | POA: Diagnosis not present

## 2019-09-13 DIAGNOSIS — M4807 Spinal stenosis, lumbosacral region: Secondary | ICD-10-CM | POA: Diagnosis not present

## 2019-09-13 DIAGNOSIS — E0843 Diabetes mellitus due to underlying condition with diabetic autonomic (poly)neuropathy: Secondary | ICD-10-CM | POA: Diagnosis not present

## 2019-09-13 DIAGNOSIS — M4726 Other spondylosis with radiculopathy, lumbar region: Secondary | ICD-10-CM | POA: Diagnosis not present

## 2019-09-19 ENCOUNTER — Inpatient Hospital Stay (HOSPITAL_COMMUNITY): Payer: Medicare Other | Attending: Hematology

## 2019-09-19 ENCOUNTER — Other Ambulatory Visit: Payer: Self-pay

## 2019-09-19 ENCOUNTER — Ambulatory Visit (HOSPITAL_COMMUNITY)
Admission: RE | Admit: 2019-09-19 | Discharge: 2019-09-19 | Disposition: A | Discharge status: Home / Self Care | Payer: Medicare Other | Source: Ambulatory Visit | Attending: Hematology | Admitting: Hematology

## 2019-09-19 DIAGNOSIS — C109 Malignant neoplasm of oropharynx, unspecified: Secondary | ICD-10-CM

## 2019-09-19 DIAGNOSIS — Z85818 Personal history of malignant neoplasm of other sites of lip, oral cavity, and pharynx: Secondary | ICD-10-CM | POA: Insufficient documentation

## 2019-09-19 DIAGNOSIS — C3412 Malignant neoplasm of upper lobe, left bronchus or lung: Secondary | ICD-10-CM | POA: Diagnosis not present

## 2019-09-19 DIAGNOSIS — Z95828 Presence of other vascular implants and grafts: Secondary | ICD-10-CM

## 2019-09-19 DIAGNOSIS — Z23 Encounter for immunization: Secondary | ICD-10-CM | POA: Diagnosis not present

## 2019-09-19 DIAGNOSIS — R42 Dizziness and giddiness: Secondary | ICD-10-CM | POA: Diagnosis not present

## 2019-09-19 DIAGNOSIS — C049 Malignant neoplasm of floor of mouth, unspecified: Secondary | ICD-10-CM

## 2019-09-19 DIAGNOSIS — R1312 Dysphagia, oropharyngeal phase: Secondary | ICD-10-CM | POA: Diagnosis not present

## 2019-09-19 LAB — TSH: TSH: 1.029 u[IU]/mL (ref 0.350–4.500)

## 2019-09-19 LAB — CBC WITH DIFFERENTIAL/PLATELET
Abs Immature Granulocytes: 0.03 10*3/uL (ref 0.00–0.07)
Basophils Absolute: 0 10*3/uL (ref 0.0–0.1)
Basophils Relative: 0 %
Eosinophils Absolute: 0.3 10*3/uL (ref 0.0–0.5)
Eosinophils Relative: 4 %
HCT: 36.4 % — ABNORMAL LOW (ref 39.0–52.0)
Hemoglobin: 11.5 g/dL — ABNORMAL LOW (ref 13.0–17.0)
Immature Granulocytes: 0 %
Lymphocytes Relative: 10 %
Lymphs Abs: 0.7 10*3/uL (ref 0.7–4.0)
MCH: 32.9 pg (ref 26.0–34.0)
MCHC: 31.6 g/dL (ref 30.0–36.0)
MCV: 104 fL — ABNORMAL HIGH (ref 80.0–100.0)
Monocytes Absolute: 0.7 10*3/uL (ref 0.1–1.0)
Monocytes Relative: 9 %
Neutro Abs: 5.9 10*3/uL (ref 1.7–7.7)
Neutrophils Relative %: 77 %
Platelets: 206 10*3/uL (ref 150–400)
RBC: 3.5 MIL/uL — ABNORMAL LOW (ref 4.22–5.81)
RDW: 15.8 % — ABNORMAL HIGH (ref 11.5–15.5)
WBC: 7.6 10*3/uL (ref 4.0–10.5)
nRBC: 0 % (ref 0.0–0.2)

## 2019-09-19 LAB — COMPREHENSIVE METABOLIC PANEL
ALT: 16 U/L (ref 0–44)
AST: 16 U/L (ref 15–41)
Albumin: 4.1 g/dL (ref 3.5–5.0)
Alkaline Phosphatase: 83 U/L (ref 38–126)
Anion gap: 6 (ref 5–15)
BUN: 19 mg/dL (ref 8–23)
CO2: 26 mmol/L (ref 22–32)
Calcium: 9.6 mg/dL (ref 8.9–10.3)
Chloride: 104 mmol/L (ref 98–111)
Creatinine, Ser: 0.56 mg/dL — ABNORMAL LOW (ref 0.61–1.24)
GFR calc Af Amer: >60 mL/min (ref >60)
GFR calc non Af Amer: >60 mL/min (ref >60)
Glucose, Bld: 106 mg/dL — ABNORMAL HIGH (ref 70–99)
Potassium: 4.5 mmol/L (ref 3.5–5.1)
Sodium: 136 mmol/L (ref 135–145)
Total Bilirubin: 0.4 mg/dL (ref 0.3–1.2)
Total Protein: 6.8 g/dL (ref 6.5–8.1)

## 2019-09-19 MED ORDER — FLUDEOXYGLUCOSE F - 18 (FDG) INJECTION
9.5300 | Freq: Once | INTRAVENOUS | Status: AC | PRN
  Administered 2019-09-19: 15:00:00 9.53 via INTRAVENOUS

## 2019-09-20 IMAGING — CT NM PET TUM IMG INITIAL (PI) SKULL BASE T - THIGH
1 of 10 series · 2 of 25 positions shown · non-contrast
Comparison: Neck CT 12/24/2018

CLINICAL DATA: Initial treatment strategy for squamous cell
carcinoma of the floor of the mouth.

EXAM:
NUCLEAR MEDICINE PET SKULL BASE TO THIGH
TECHNIQUE: 8.67 mCi F-18 FDG was injected intravenously. Full-ring PET imaging
was performed from the skull base to thigh after the radiotracer. CT
data was obtained and used for attenuation correction and anatomic
localization.
Fasting blood glucose: 113 mg/dl

[Series 3: ct wb 5.0 b30f · axial · 5.0mm · 0.98mm/px · z∈[-1179,-195]mm · 2 of 329 slices shown]
[im 1/329  brain]
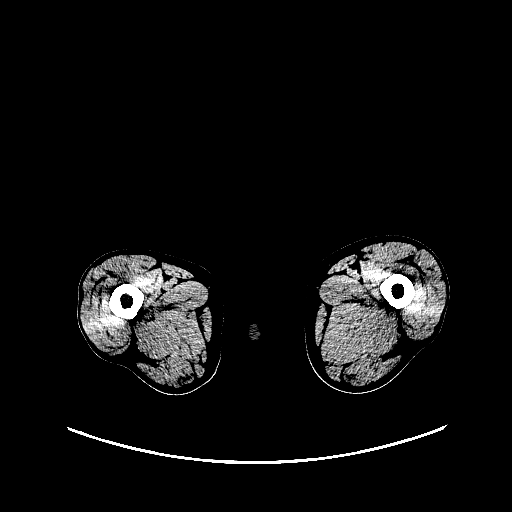
[im 329/329  brain]
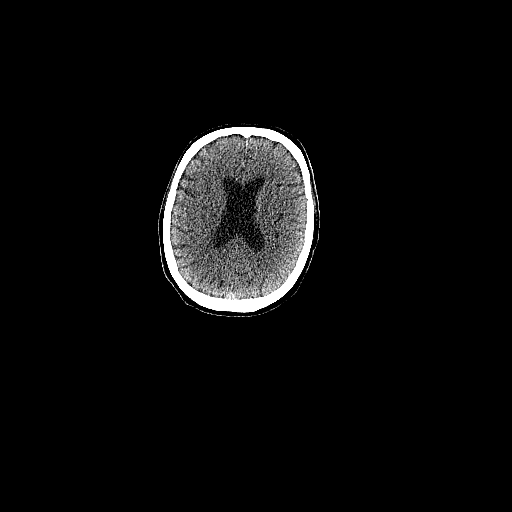

[2 of 25 positions shown; findings below may reference images not displayed]

FINDINGS: Mediastinal blood pool activity: SUV max

NECK: The enhancing mass in the floor of the mouth on the left side
is hypermetabolic with SUV max of 9.51.

The 13 mm level 1 lymph node on the left side is hypermetabolic with
SUV max of 4.05 and consistent with metastatic adenopathy.

The left submandibular gland is higher in attenuation than the right
submandibular gland and also slightly larger. It is weakly
hypermetabolic with SUV max of 2.24 compared to the right
submandibular gland which is 1.68. This is equivocal for tumor
involvement.

No supraclavicular adenopathy.

Incidental CT findings: none

CHEST: 27 x 21 mm thick walled cavitary lesion in the left upper
lobe is noted along with a slightly more superior adjacent 8 mm
nodule. This demonstrates no significant hypermetabolism. SUV max is
1.51 which is less than the mediastinal background activity. This
could be a postinflammatory or postinfectious process. However, I
would recommend close CT follow-up.

No enlarged or hypermetabolic mediastinal or hilar lymph nodes.
Small scattered lymph nodes are noted but no hypermetabolism. The
esophagus is grossly normal.

Incidental CT findings: Age advanced three-vessel coronary artery
calcifications are noted along with moderate aortic calcifications.

ABDOMEN/PELVIS: No abnormal hypermetabolic activity within the
liver, pancreas, adrenal glands, or spleen. No hypermetabolic lymph
nodes in the abdomen or pelvis.

Incidental CT findings: Age advanced atherosclerotic calcifications
involving the aorta, iliac arteries and branch vessels. No focal
aneurysm.

Bilateral low-attenuation adrenal gland lesions consistent with
benign adenomas. No hypermetabolism.

SKELETON: No focal hypermetabolic activity to suggest skeletal
metastasis.

Incidental CT findings: none
IMPRESSION: 1. The floor of the mouth lesion on the left is hypermetabolic and
consistent with patient's known squamous cell carcinoma.
2. Left level 1 lymph node is hypermetabolic and consistent with
metastatic adenopathy.
3. Equivocal left submandibular gland as detailed above.
4. 2.7 x 2.1 mm thick walled cavitary lesion in the left upper lobe
not demonstrating any significant hypermetabolism. Recommend CT
surveillance with follow-up noncontrast chest CT in 4-6 months.
5. No enlarged or hypermetabolic mediastinal/hilar adenopathy.
6. No findings for metastatic disease involving the abdomen/pelvis.
Benign-appearing bilateral adrenal gland adenomas.
7. Age advanced atherosclerotic calcifications involving the
thoracic and abdominal aorta and branch vessels including
three-vessel coronary artery calcifications.

## 2019-09-22 ENCOUNTER — Other Ambulatory Visit: Payer: Self-pay

## 2019-09-22 ENCOUNTER — Encounter (HOSPITAL_COMMUNITY): Payer: Self-pay | Admitting: Hematology

## 2019-09-22 ENCOUNTER — Inpatient Hospital Stay (HOSPITAL_BASED_OUTPATIENT_CLINIC_OR_DEPARTMENT_OTHER): Payer: Medicare Other | Admitting: Hematology

## 2019-09-22 VITALS — BP 102/65 | HR 87 | Temp 97.7°F | Resp 16 | Wt 150.3 lb

## 2019-09-22 DIAGNOSIS — R1312 Dysphagia, oropharyngeal phase: Secondary | ICD-10-CM | POA: Diagnosis not present

## 2019-09-22 DIAGNOSIS — C049 Malignant neoplasm of floor of mouth, unspecified: Secondary | ICD-10-CM

## 2019-09-22 DIAGNOSIS — R42 Dizziness and giddiness: Secondary | ICD-10-CM | POA: Diagnosis not present

## 2019-09-22 DIAGNOSIS — Z85818 Personal history of malignant neoplasm of other sites of lip, oral cavity, and pharynx: Secondary | ICD-10-CM | POA: Diagnosis not present

## 2019-09-22 DIAGNOSIS — Z0000 Encounter for general adult medical examination without abnormal findings: Secondary | ICD-10-CM

## 2019-09-22 DIAGNOSIS — C3412 Malignant neoplasm of upper lobe, left bronchus or lung: Secondary | ICD-10-CM | POA: Diagnosis not present

## 2019-09-22 DIAGNOSIS — Z23 Encounter for immunization: Secondary | ICD-10-CM | POA: Diagnosis not present

## 2019-09-22 IMAGING — CR DG CHEST 1V PORT
1 series · 1 of 1 positions shown · non-contrast
Comparison: Radiographs August 23, 2013.

CLINICAL DATA: Port-A-Cath placement.

EXAM:
PORTABLE CHEST 1 VIEW

[portable]
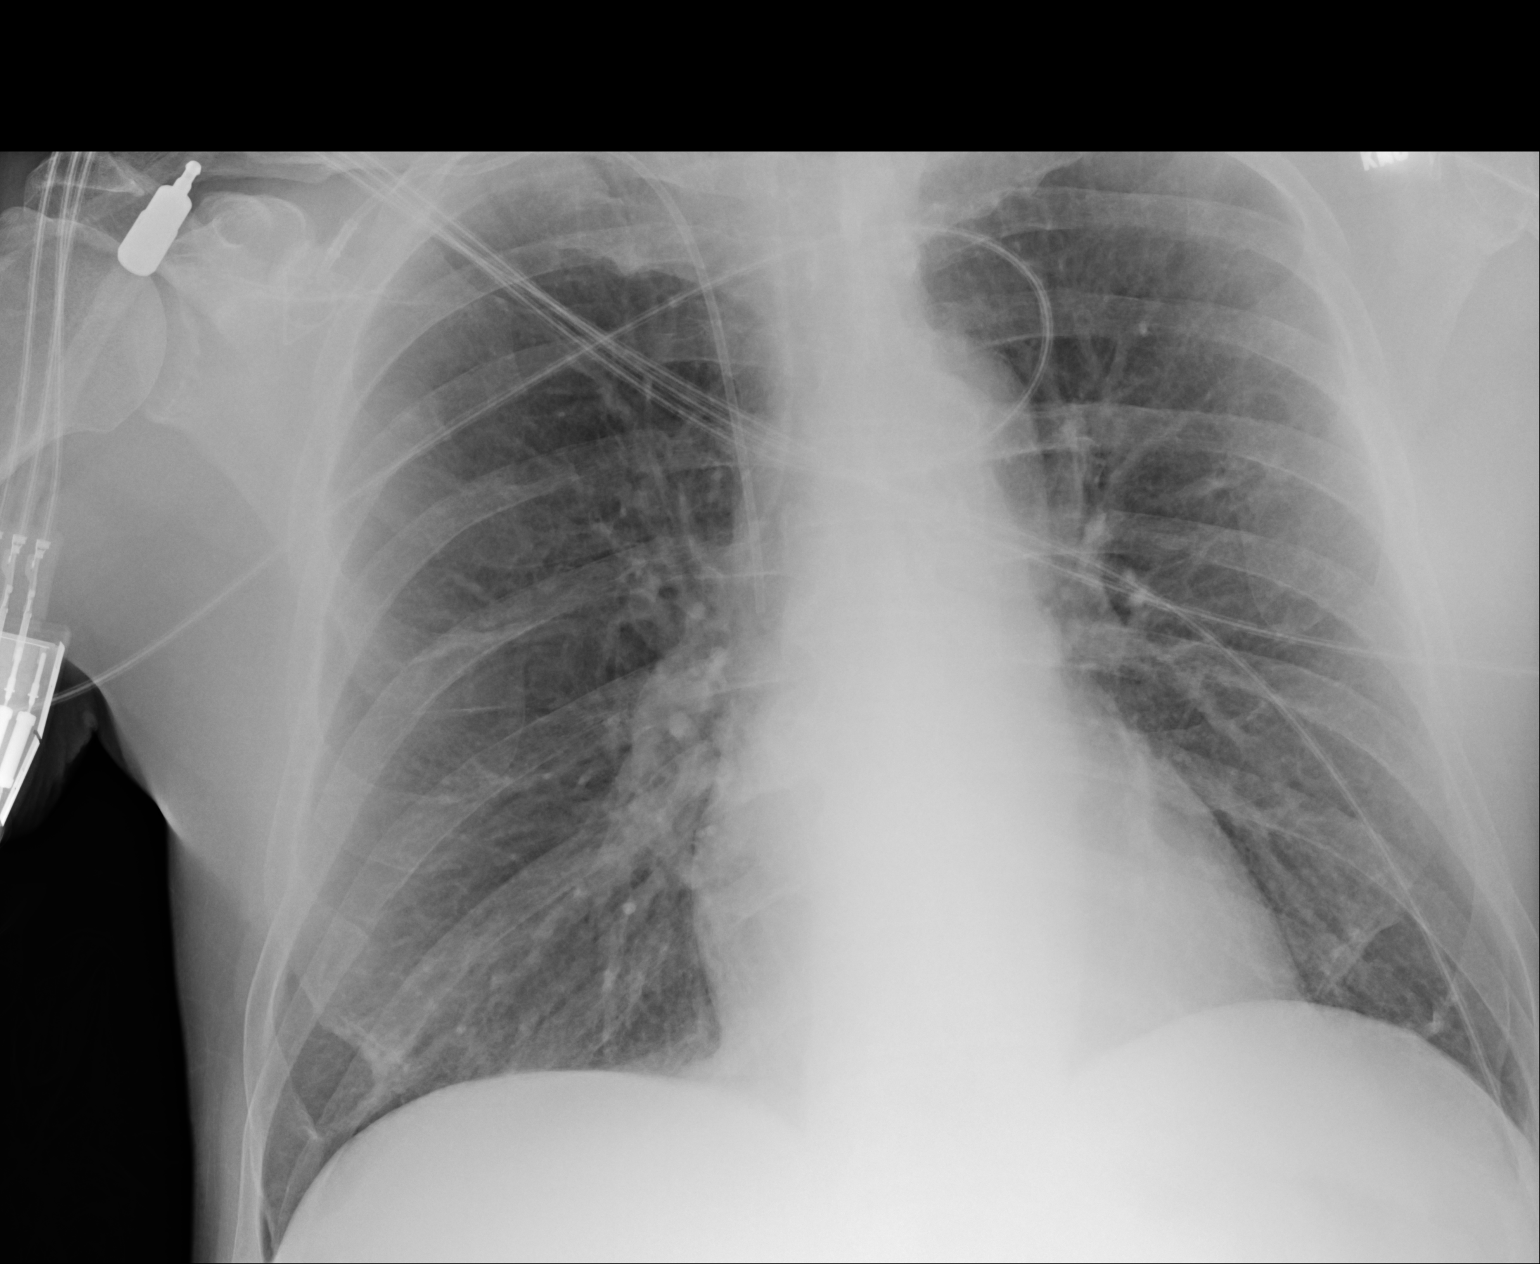

[1 of 1 positions shown; findings below may reference images not displayed]

FINDINGS: The heart size and mediastinal contours are within normal limits.
Both lungs are clear. No pneumothorax or pleural effusion is noted.
Interval placement of right subclavian Port-A-Cath with distal tip
in expected position of the SVC. The visualized skeletal structures
are unremarkable.
IMPRESSION: Interval placement of right subclavian Port-A-Cath with distal tip
in expected position of the SVC. No pneumothorax is noted.

## 2019-09-22 MED ORDER — INFLUENZA VAC A&B SA ADJ QUAD 0.5 ML IM PRSY
0.5000 mL | PREFILLED_SYRINGE | Freq: Once | INTRAMUSCULAR | Status: AC
  Administered 2019-09-22: 0.5 mL via INTRAMUSCULAR
  Filled 2019-09-22: qty 0.5

## 2019-09-22 NOTE — Patient Instructions (Signed)
[place] at [facility] Discharge Instructions  You were seen today by Dr. Delton Coombes. He went over your recent scan results. He will refer you to Radiation in [place] for treatment. He will see you back in 3 months for labs and follow up.   Thank you for choosing [place] at [facility] to provide your oncology and hematology care.  To afford each patient quality time with our provider, please arrive at least 15 minutes before your scheduled appointment time.   If you have a lab appointment with the [facility] please come in thru the  Main Entrance and check in at the main information desk  You need to re-schedule your appointment should you arrive 10 or more minutes late.  We strive to give you quality time with our providers, and arriving late affects you and other patients whose appointments are after yours.  Also, if you no show three or more times for appointments you may be dismissed from the clinic at the providers discretion.     Again, thank you for choosing [facility]  Our hope is that these requests will decrease the amount of time that you wait before being seen by our physicians.       _____________________________________________________________  Should you have questions after your visit to [facility], please contact our office at [phone] between the hours of 8:00 a.m. and 4:30 p.m.  Voicemails left after 4:00 p.m. will not be returned until the following business day.  For prescription refill requests, have your pharmacy contact our office and allow 72 hours.    Cancer Center Support Programs:   > Cancer Support Group  2nd Tuesday of the month 1pm-2pm, Journey Room
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
## 2019-09-22 NOTE — Assessment & Plan Note (Signed)
1.  Stage IVa (PT4APN1) squamous cell carcinoma of the floor of the mouth: -Weekly cisplatin and radiation from 06/15/2019 through 07/27/2019. -He is able to eat soft foods by mouth. -Oropharyngeal exam did not reveal any recurrence. -PET scan on 09/19/2019 did not show any recurrence. -I will see him back in 3 months for follow-up.  2.  Nutrition: -He has cut back to 6 cans of Osmolite daily.  He is eating soft foods. -He is continuing to gain weight.  3.  Non-small cell lung cancer: -Navigational bronchoscopy and biopsy by Dr. Roxan Hockey on 05/16/2019 was consistent with non-small cell lung cancer. -PET scan on 09/19/2019 showed left upper lobe lung lesion with SUV 2.5. -I will make a referral to radiation oncology in [facility] for SBRT.

## 2019-09-23 ENCOUNTER — Ambulatory Visit (HOSPITAL_COMMUNITY): Payer: Medicare Other

## 2019-09-23 NOTE — Progress Notes (Signed)
Nutrition Follow-up:  Patient with stage IV SCC of the mouth.  Patient has completed radiation and chemotherapy.  Patient s/p surgical resection on 02/2019 at [facility].  Spoke with patient this pm for nutrition follow-up.  Patient reports that he is giving 6 cartons of glucerna 1.5 via feeding tube.  Has been eating about 2 meals per day.  Ate egg and bolonga sandwich this am.  Last night ate mashed potatoes and chicken (cut in small bites).  Reports that he is wanting to eat more orally.   Reports that he received a call from SLP but did not call them back.      Medications: reviewed  Labs: reviewed  Anthropometrics:   Weight increased to 150 lb 4.8 oz on 10/15.     Estimated Energy Needs  Kcals: [phone] Protein: 100-120 g Fluid: 2.4 L  NUTRITION DIAGNOSIS: Inadequate oral intake improving   INTERVENTION:  Encouraged patient to call SLP back and schedule an appointment.  Discussed with patient again that this is in [place] not at [facility].   Recommend patient decrease to 5 cartons of glucerna 1.5 due to weight increase and eating more orally. Will monitor weight weekly and decrease tube feeding by 1 carton weekly pending weight results.   Reports that he has formula and supplies.    MONITORING, EVALUATION, GOAL: Patient will consume adequate calories and protein to maintain weight   NEXT VISIT: phone f/u Nov 6  Joli B. Zenia Resides, [place], [place] Registered Dietitian [phone] (pager)

## 2019-09-26 ENCOUNTER — Telehealth: Payer: Self-pay | Admitting: *Deleted

## 2019-09-26 NOTE — Telephone Encounter (Signed)
Called and left voicemail to schedule an appointment with radiation oncology.

## 2019-09-28 ENCOUNTER — Ambulatory Visit (HOSPITAL_COMMUNITY): Payer: Medicare Other | Admitting: Hematology

## 2019-10-10 DIAGNOSIS — C049 Malignant neoplasm of floor of mouth, unspecified: Secondary | ICD-10-CM | POA: Diagnosis not present

## 2019-10-11 DIAGNOSIS — M5136 Other intervertebral disc degeneration, lumbar region: Secondary | ICD-10-CM | POA: Diagnosis not present

## 2019-10-11 DIAGNOSIS — M545 Low back pain: Secondary | ICD-10-CM | POA: Diagnosis not present

## 2019-10-11 DIAGNOSIS — G894 Chronic pain syndrome: Secondary | ICD-10-CM | POA: Diagnosis not present

## 2019-10-11 DIAGNOSIS — M5030 Other cervical disc degeneration, unspecified cervical region: Secondary | ICD-10-CM | POA: Diagnosis not present

## 2019-10-13 ENCOUNTER — Ambulatory Visit
Admission: RE | Admit: 2019-10-13 | Discharge: 2019-10-13 | Disposition: A | Discharge status: Home / Self Care | Payer: Medicare Other | Source: Ambulatory Visit | Attending: Radiation Oncology | Admitting: Radiation Oncology

## 2019-10-13 ENCOUNTER — Encounter: Payer: Self-pay | Admitting: Radiation Oncology

## 2019-10-13 ENCOUNTER — Other Ambulatory Visit: Payer: Self-pay

## 2019-10-13 DIAGNOSIS — Z79899 Other long term (current) drug therapy: Secondary | ICD-10-CM | POA: Insufficient documentation

## 2019-10-13 DIAGNOSIS — I10 Essential (primary) hypertension: Secondary | ICD-10-CM | POA: Insufficient documentation

## 2019-10-13 DIAGNOSIS — J449 Chronic obstructive pulmonary disease, unspecified: Secondary | ICD-10-CM | POA: Insufficient documentation

## 2019-10-13 DIAGNOSIS — M109 Gout, unspecified: Secondary | ICD-10-CM | POA: Diagnosis not present

## 2019-10-13 DIAGNOSIS — K219 Gastro-esophageal reflux disease without esophagitis: Secondary | ICD-10-CM | POA: Insufficient documentation

## 2019-10-13 DIAGNOSIS — M129 Arthropathy, unspecified: Secondary | ICD-10-CM | POA: Insufficient documentation

## 2019-10-13 DIAGNOSIS — Z7984 Long term (current) use of oral hypoglycemic drugs: Secondary | ICD-10-CM | POA: Diagnosis not present

## 2019-10-13 DIAGNOSIS — Z85818 Personal history of malignant neoplasm of other sites of lip, oral cavity, and pharynx: Secondary | ICD-10-CM | POA: Diagnosis not present

## 2019-10-13 DIAGNOSIS — Z9221 Personal history of antineoplastic chemotherapy: Secondary | ICD-10-CM | POA: Diagnosis not present

## 2019-10-13 DIAGNOSIS — C3412 Malignant neoplasm of upper lobe, left bronchus or lung: Secondary | ICD-10-CM

## 2019-10-13 DIAGNOSIS — Z801 Family history of malignant neoplasm of trachea, bronchus and lung: Secondary | ICD-10-CM | POA: Insufficient documentation

## 2019-10-13 DIAGNOSIS — Z87891 Personal history of nicotine dependence: Secondary | ICD-10-CM | POA: Diagnosis not present

## 2019-10-13 DIAGNOSIS — Z923 Personal history of irradiation: Secondary | ICD-10-CM | POA: Diagnosis not present

## 2019-10-13 DIAGNOSIS — E119 Type 2 diabetes mellitus without complications: Secondary | ICD-10-CM | POA: Insufficient documentation

## 2019-10-13 NOTE — Progress Notes (Signed)
Thoracic Location of Tumor / Histology: Left upper lobe  Patient presented with symptoms of: lesion was found on PET 01/10/19  Biopsies revealed: 05/16/19: Diagnosis TRANSBRONCHIAL NEEDLE ASPIRATION, NAVIGATION, LLL, BRUSHING, B (SPECIMEN 2 OF 2, COLLECTED 05/16/19): MALIGNANT CELLS PRESENT CONSISTENT WITH NON-SMALL CELL CARCINOMA.   Tobacco/Marijuana/Snuff/ETOH use:  Tobacco Use  . Smoking status: Former Smoker    Packs/day: 1.00    Years: 55.00    Pack years: 55.00    Types: Cigarettes    Quit date: 02/25/2019    Years since quitting: 0.5  . Smokeless tobacco: Never Used  Substance and Sexual Activity  . Alcohol use: Yes    Alcohol/week: 1.0 standard drinks    Types: 1 Cans of beer per week    Comment: occasional   . Drug use: No     Past/Anticipated interventions by cardiothoracic surgery, if any: 05/16/19: PROCEDURE:  Electromagnetic navigational bronchoscopy with needle aspirations, brushings, transbronchial biopsies and fiducial placement.  SURGEON:  Modesto Charon, MD  Past/Anticipated interventions by medical oncology, if any: Per Dr. Delton Coombes 09/22/19:  1.  Stage IVa (PT4APN1) squamous cell carcinoma of the floor of the mouth: -Weekly cisplatin and radiation from 06/15/2019 through 07/27/2019. -He is able to eat soft foods by mouth. -Oropharyngeal exam did not reveal any recurrence. -PET scan on 09/19/2019 did not show any recurrence. -I will see him back in 3 months for follow-up.  2.  Nutrition: -He has cut back to 6 cans of Osmolite daily.  He is eating soft foods. -He is continuing to gain weight.  3.  Non-small cell lung cancer: -Navigational bronchoscopy and biopsy by Dr. Roxan Hockey on 05/16/2019 was consistent with non-small cell lung cancer. -PET scan on 09/19/2019 showed left upper lobe lung lesion with SUV 2.5. -I will make a referral to radiation oncology in [facility] for SBRT  Signs/Symptoms Weight changes, if any:  Wt Readings  from Last 3 Encounters:  10/13/19 149 lb 6 oz (67.8 kg)  09/22/19 150 lb 4.8 oz (68.2 kg)  08/22/19 147 lb 9.6 oz (67 kg)       Respiratory complaints, if any: denies cough  Hemoptysis, if any: denies hemoptysis  Pain issues, if any:  Pt c/o pain in LEFT jaw area associated with flap/prior surgery. He is taking in 6 cans of Osmolite daily.  Continuing to gain weight.  Occasional dizziness when bending reported.  Occasional sleep problems are stable.  No new pains reported.  He is able to eat soft foods.  Denies any cough or hemoptysis.  Still has some trouble swallowing solid foods.  SAFETY ISSUES: Prior radiation? Treatment site Treatment Technique/Modality Energy Dose per fraction Total number of fractions Total dose Start date End date  Oral cavity resection bed and neck IMRT 6 MV 200 cGy 31 of a planned 33 6200 cGy of a planned 6600 cGy 06/15/2019  07/29/2019     Pacemaker/ICD? No  Possible current pregnancy? N/A  Is the patient on methotrexate? No  Current Complaints / other details:  Pt presents today for initial consult with Dr. Sondra Come for SBRT/Radiation Oncology.  BP 90/67 (BP Location: Left Arm, Patient Position: Sitting)   Pulse 91   Temp 98.5 F (36.9 C) (Temporal)   Resp 18   Ht 6\' 2"  (1.88 m)   Wt 149 lb 6 oz (67.8 kg)   SpO2 98%   BMI 19.18 kg/m   From Radiation Oncology physician at [facility]: Assessment: Lance [name] a 68 y.o.man who is a 50-pack-year smoker and is diagnosed  withasynchronous oral cavity and lung cancers.   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 Lance [name] had a very difficult time with this treatment due to oral cavity and oropharyngeal pain. This started after only the first week of chemoradiation when he developed confluent candidiasis. This responded to fluconazole, but the patient had 2 additional rounds of candidiasis that required treatment with acute fluconazole. Within 1 to 2 days of starting the medication, the patient's pain and ability to take in p.o. dramatically improved, but the recurrent cycles of pain were quite frustrating for him. The third time he developed thrush, he said he was going to stop coming for treatment, but after he responded quickly to fluconazole he decided to continue and did not miss any days. However, fraction 31 out of 33 was delivered on a Friday and the patient decided at that time he would not return after the weekend for additional treatment. Therefore, the patient received a total dose of 6200 cGy over 31 fractions ending on 07/29/2019.  Separately, the patient underwent a PET/CT on 04/05/19 (after his primary oral cavity resection) and this was concerning for several features. In particular there was a LUL nodule for which navigational bronchoscopy was performed on 05/16/19. Somewhat surprisingly, pathology from this biopsy revealed non-small cell lung cancer and I believe the patient was discussed at a tumor board in [place] and at that time lung SBRT was recommended to follow his head and neck cancer treatment.   Lance [name] did not come for his previously scheduled post radiation follow-up visit, but returns today and appears to have improved from his acute toxicities, but is not back to his baseline. He has an appointment for a PET CT scan this week and then will follow up with Dr. Delton Coombes. He also needs to return to [facility] for lung SBRT, for which I believe Dr. Delton Coombes has already put in a referral.
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
## 2019-10-13 NOTE — Patient Instructions (Signed)
Coronavirus (COVID-19) Are you at risk?  Are you at risk for the Coronavirus (COVID-19)?  To be considered HIGH RISK for Coronavirus (COVID-19), you have to meet the following criteria:  . Traveled to [place], [place], [place], [place] or [place]; or in the [place] to [place], [place], [place], or [place]; and have fever, cough, and shortness of breath within the last 2 weeks of travel OR . Been in close contact with a person diagnosed with COVID-19 within the last 2 weeks and have fever, cough, and shortness of breath . IF YOU DO NOT MEET THESE CRITERIA, YOU ARE CONSIDERED LOW RISK FOR COVID-19.  What to do if you are HIGH RISK for COVID-19?  . If you are having a medical emergency, call 911. . Seek medical care right away. Before you go to a doctor's office, urgent care or emergency department, call ahead and tell them about your recent travel, contact with someone diagnosed with COVID-19, and your symptoms. You should receive instructions from your physician's office regarding next steps of care.  . When you arrive at healthcare provider, tell the healthcare staff immediately you have returned from visiting [place], [place], [place], [place] or [place]; or traveled in the [place] to [place], [place], [place], or [place]; in the last two weeks or you have been in close contact with a person diagnosed with COVID-19 in the last 2 weeks.   . Tell the health care staff about your symptoms: fever, cough and shortness of breath. . After you have been seen by a medical provider, you will be either: o Tested for (COVID-19) and discharged home on quarantine except to seek medical care if symptoms worsen, and asked to  - Stay home and avoid contact with others until you get your results (4-5 days)  - Avoid travel on public transportation if possible (such as bus, train, or airplane) or o Sent to the Emergency Department by EMS for evaluation, COVID-19 testing, and possible  admission depending on your condition and test results.  What to do if you are LOW RISK for COVID-19?  Reduce your risk of any infection by using the same precautions used for avoiding the common cold or flu:  . Wash your hands often with soap and warm water for at least 20 seconds.  If soap and water are not readily available, use an alcohol-based hand sanitizer with at least 60% alcohol.  . If coughing or sneezing, cover your mouth and nose by coughing or sneezing into the elbow areas of your shirt or coat, into a tissue or into your sleeve (not your hands). . Avoid shaking hands with others and consider head nods or verbal greetings only. . Avoid touching your eyes, nose, or mouth with unwashed hands.  . Avoid close contact with people who are sick. . Avoid places or events with large numbers of people in one location, like concerts or sporting events. . Carefully consider travel plans you have or are making. . If you are planning any travel outside or inside the [place], visit the CDC's Travelers' Health webpage for the latest health notices. . If you have some symptoms but not all symptoms, continue to monitor at home and seek medical attention if your symptoms worsen. . If you are having a medical emergency, call 911.   ADDITIONAL HEALTHCARE OPTIONS FOR PATIENTS  [facility] / e-Visit: [email or web address]         [facility]: [phone]  [facility]: [phone]                   [facility]: [phone]   

## 2019-10-14 ENCOUNTER — Encounter (HOSPITAL_COMMUNITY): Payer: Medicare Other

## 2019-10-19 ENCOUNTER — Other Ambulatory Visit: Payer: Self-pay

## 2019-10-19 ENCOUNTER — Ambulatory Visit
Admission: RE | Admit: 2019-10-19 | Discharge: 2019-10-19 | Disposition: A | Discharge status: Home / Self Care | Payer: Medicare Other | Source: Ambulatory Visit | Attending: Radiation Oncology | Admitting: Radiation Oncology

## 2019-10-19 DIAGNOSIS — Z510 Encounter for antineoplastic radiation therapy: Secondary | ICD-10-CM | POA: Insufficient documentation

## 2019-10-19 DIAGNOSIS — C3412 Malignant neoplasm of upper lobe, left bronchus or lung: Secondary | ICD-10-CM | POA: Diagnosis not present

## 2019-10-19 NOTE — Progress Notes (Signed)
Radiation Oncology         (336) [phone] ________________________________  Name: Lance [name] MRN: [id number]  Date: 10/19/2019  DOB: 1951-11-21   STEREOTACTIC BODY RADIOTHERAPY SIMULATION AND TREATMENT PLANNING NOTE    DIAGNOSIS: Non-Small Cell Carcinoma of LUL clinical stage T1c, No, M0  NARRATIVE:  The patient was brought to the CT Simulation planning suite.  Identity was confirmed.  All relevant records and images related to the planned course of therapy were reviewed.  The patient freely provided informed written consent to proceed with treatment after reviewing the details related to the planned course of therapy. The consent form was witnessed and verified by the simulation staff.  Then, the patient was set-up in a stable reproducible  supine position for radiation therapy.  A BodyFix immobilization pillow was fabricated for reproducible positioning.  Then I personally applied the abdominal compression paddle to limit respiratory excursion.  4D respiratoy motion management CT images were obtained.  Surface markings were placed.  The CT images were loaded into the planning software.  Then, using Cine, MIP, and standard views, the internal target volume (ITV) and planning target volumes (PTV) were delinieated, and avoidance structures were contoured.  Treatment planning then occurred.  The radiation prescription was entered and confirmed.  A total of two complex treatment devices were fabricated in the form of the BodyFix immobilization pillow and a neck accuform cushion.  I have requested : 3D Simulation  I have requested a DVH of the following structures: Heart, Lungs, Esophagus, Chest Wall, Brachial Plexus, Major Blood Vessels, and targets.  PLAN:  The patient will receive 54 Gy in 3 fractions.  -----------------------------------  Billie Lade, PhD, MD  This document serves as a record of services personally performed by Antony Blackbird, MD. It was created on his behalf by Nikki Dom, a trained medical scribe. The creation of this record is based on the scribe's personal observations and the provider's statements to them. This document has been checked and approved by the attending provider.

## 2019-10-28 ENCOUNTER — Ambulatory Visit (HOSPITAL_COMMUNITY): Payer: Medicare Other

## 2019-10-28 NOTE — Progress Notes (Addendum)
Nutrition Follow-up:  Called patient this am and left message.    Called patient again this pm and was able to speak with him.    Patient with stage IV SCC of the mouth.  Patient has completed radiation and chemotherapy.  Patient s/p surgical resection on 02/2019 at [facility].    Patient reports via phone that he is giving 6 cartons of glucerna 1.5 via PEG tube.  Reports that he is tolerating well.    Eating mostly soft foods (mashed potatoes, pinto beans, chicken and dumplings but meat chopped really fine).  Can't tolerate eggs says he can't chew them up enough.    Reports that his tongue is swelling up again.  Has appointment to see ENT on 12/3.  Also starts SBRT on left upper lobe nodule.     Medications: reviewed  Labs: no new  Anthropometrics:   Weight 150-152 lb on patient home scale.    150 lb 4.8 oz on 10/15   Estimated Energy Needs  Kcals: 2000-2400 Protein: 100-120 g Fluid: 2.4 L  NUTRITION DIAGNOSIS: Inadequate oral intake stable   INTERVENTION:  Patient did not go to SLP evaluation as did not feel it was necessary.  Discussed with patient to try decreasing tube feeding to 5 cartons of glucerna 1.5 per day and monitor weight.  If unable to maintain weight then will need to increase to 6 cartons per day.  Patient to continue to eat orally as able. Discussed soft food options.   Encouraged oral nutrition supplements if able to drink for additional calories and protein orally.  Patient has contact information    MONITORING, EVALUATION, GOAL:  Patient will consume adequate calories and protein to maintain weight   NEXT VISIT: phone f/u on Jan 15th   Joli B. Zenia Resides, [place], [place] Registered Dietitian [phone] (pager)

## 2019-10-31 DIAGNOSIS — C3412 Malignant neoplasm of upper lobe, left bronchus or lung: Secondary | ICD-10-CM | POA: Diagnosis not present

## 2019-10-31 DIAGNOSIS — Z510 Encounter for antineoplastic radiation therapy: Secondary | ICD-10-CM | POA: Diagnosis not present

## 2019-11-01 ENCOUNTER — Ambulatory Visit
Admission: RE | Admit: 2019-11-01 | Discharge: 2019-11-01 | Disposition: A | Discharge status: Home / Self Care | Payer: Medicare Other | Source: Ambulatory Visit | Attending: Radiation Oncology | Admitting: Radiation Oncology

## 2019-11-01 ENCOUNTER — Other Ambulatory Visit: Payer: Self-pay

## 2019-11-01 DIAGNOSIS — Z510 Encounter for antineoplastic radiation therapy: Secondary | ICD-10-CM | POA: Diagnosis not present

## 2019-11-01 DIAGNOSIS — C3412 Malignant neoplasm of upper lobe, left bronchus or lung: Secondary | ICD-10-CM | POA: Diagnosis not present

## 2019-11-01 NOTE — Progress Notes (Signed)
Radiation Oncology         (336) [phone] ________________________________  Name: TYWANN JOUETT MRN: [id number]  Date: 11/01/2019  DOB: 04-23-1951  Stereotactic Body Radiotherapy Treatment Procedure Note  NARRATIVE:  BRISYN KAZEE was brought to the stereotactic radiation treatment machine and placed supine on the CT couch. The patient was set up for stereotactic body radiotherapy on the body fix pillow.  3D TREATMENT PLANNING AND DOSIMETRY:  The patient's radiation plan was reviewed and approved prior to starting treatment.  It showed 3-dimensional radiation distributions overlaid onto the planning CT.  The [facility] for the target structures as well as the organs at risk were reviewed. The documentation of this is filed in the radiation oncology EMR.  SIMULATION VERIFICATION:  The patient underwent CT imaging on the treatment unit.  These were carefully aligned to document that the ablative radiation dose would cover the target volume and maximally spare the nearby organs at risk according to the planned distribution.  SPECIAL TREATMENT PROCEDURE: Gerrit Heck received high dose ablative stereotactic body radiotherapy to the planned target volume without unforeseen complications. Treatment was delivered uneventfully. The high doses associated with stereotactic body radiotherapy and the significant potential risks require careful treatment set up and patient monitoring constituting a special treatment procedure   STEREOTACTIC TREATMENT MANAGEMENT:  Following delivery, the patient was evaluated clinically. The patient tolerated treatment without significant acute effects, and was discharged to home in stable condition.    PLAN: Continue treatment as planned.  ________________________________  Billie Lade, PhD, MD

## 2019-11-08 ENCOUNTER — Ambulatory Visit
Admission: RE | Admit: 2019-11-08 | Discharge: 2019-11-08 | Disposition: A | Discharge status: Home / Self Care | Payer: Medicare Other | Source: Ambulatory Visit | Attending: Radiation Oncology | Admitting: Radiation Oncology

## 2019-11-08 ENCOUNTER — Other Ambulatory Visit: Payer: Self-pay

## 2019-11-08 DIAGNOSIS — C3412 Malignant neoplasm of upper lobe, left bronchus or lung: Secondary | ICD-10-CM | POA: Diagnosis present

## 2019-11-08 DIAGNOSIS — Z510 Encounter for antineoplastic radiation therapy: Secondary | ICD-10-CM | POA: Insufficient documentation

## 2019-11-08 NOTE — Progress Notes (Signed)
Radiation Oncology         (336) [phone] ________________________________  Name: LAKIM LEWANDOSKI MRN: [id number]  Date: 11/08/2019  DOB: 06-Apr-1951  Stereotactic Body Radiotherapy Treatment Procedure Note  NARRATIVE:  ANTIONO NOLAND was brought to the stereotactic radiation treatment machine and placed supine on the CT couch. The patient was set up for stereotactic body radiotherapy on the body fix pillow.  3D TREATMENT PLANNING AND DOSIMETRY:  The patient's radiation plan was reviewed and approved prior to starting treatment.  It showed 3-dimensional radiation distributions overlaid onto the planning CT.  The [facility] for the target structures as well as the organs at risk were reviewed. The documentation of this is filed in the radiation oncology EMR.  SIMULATION VERIFICATION:  The patient underwent CT imaging on the treatment unit.  These were carefully aligned to document that the ablative radiation dose would cover the target volume and maximally spare the nearby organs at risk according to the planned distribution.  SPECIAL TREATMENT PROCEDURE: Gerrit Heck received high dose ablative stereotactic body radiotherapy to the planned target volume without unforeseen complications. Treatment was delivered uneventfully. The high doses associated with stereotactic body radiotherapy and the significant potential risks require careful treatment set up and patient monitoring constituting a special treatment procedure   STEREOTACTIC TREATMENT MANAGEMENT:  Following delivery, the patient was evaluated clinically. The patient tolerated treatment without significant acute effects, and was discharged to home in stable condition.    PLAN: Continue treatment as planned.  ________________________________  Billie Lade, PhD, MD

## 2019-11-10 ENCOUNTER — Encounter: Payer: Self-pay | Admitting: Radiation Oncology

## 2019-11-10 ENCOUNTER — Other Ambulatory Visit: Payer: Self-pay

## 2019-11-10 ENCOUNTER — Ambulatory Visit
Admission: RE | Admit: 2019-11-10 | Discharge: 2019-11-10 | Disposition: A | Discharge status: Home / Self Care | Payer: Medicare Other | Source: Ambulatory Visit | Attending: Radiation Oncology | Admitting: Radiation Oncology

## 2019-11-10 DIAGNOSIS — Z510 Encounter for antineoplastic radiation therapy: Secondary | ICD-10-CM | POA: Diagnosis not present

## 2019-11-10 DIAGNOSIS — C3412 Malignant neoplasm of upper lobe, left bronchus or lung: Secondary | ICD-10-CM

## 2019-11-10 NOTE — Progress Notes (Signed)
Radiation Oncology         (336) [phone] ________________________________  Name: Lance [name] MRN: [id number]  Date: 11/10/2019  DOB: 1951-03-12  Stereotactic Body Radiotherapy Treatment Procedure Note  NARRATIVE:  Lance [name] was brought to the stereotactic radiation treatment machine and placed supine on the CT couch. The patient was set up for stereotactic body radiotherapy on the body fix pillow.  3D TREATMENT PLANNING AND DOSIMETRY:  The patient's radiation plan was reviewed and approved prior to starting treatment.  It showed 3-dimensional radiation distributions overlaid onto the planning CT.  The [facility] for the target structures as well as the organs at risk were reviewed. The documentation of this is filed in the radiation oncology EMR.  SIMULATION VERIFICATION:  The patient underwent CT imaging on the treatment unit.  These were carefully aligned to document that the ablative radiation dose would cover the target volume and maximally spare the nearby organs at risk according to the planned distribution.  SPECIAL TREATMENT PROCEDURE: Lance [name] received high dose ablative stereotactic body radiotherapy to the planned target volume without unforeseen complications. Treatment was delivered uneventfully. The high doses associated with stereotactic body radiotherapy and the significant potential risks require careful treatment set up and patient monitoring constituting a special treatment procedure   STEREOTACTIC TREATMENT MANAGEMENT:  Following delivery, the patient was evaluated clinically. The patient tolerated treatment without significant acute effects, and was discharged to home in stable condition.    PLAN: Continue treatment as planned.  ________________________________  Billie Lade, PhD, MD

## 2019-11-14 DIAGNOSIS — M5136 Other intervertebral disc degeneration, lumbar region: Secondary | ICD-10-CM | POA: Diagnosis not present

## 2019-11-14 DIAGNOSIS — M545 Low back pain: Secondary | ICD-10-CM | POA: Diagnosis not present

## 2019-11-14 DIAGNOSIS — G894 Chronic pain syndrome: Secondary | ICD-10-CM | POA: Diagnosis not present

## 2019-11-14 DIAGNOSIS — M5030 Other cervical disc degeneration, unspecified cervical region: Secondary | ICD-10-CM | POA: Diagnosis not present

## 2019-11-14 NOTE — Progress Notes (Incomplete)
Patient Name: Lance [name] MRN: [id number] DOB: 27-May-1951 Referring Physician: [facility] ASHISH (Profile Not Attached) Date of Service: 11/10/2019 [facility] [place], [place]                                                        End Of Treatment Note  Diagnoses: C34.12-Malignant neoplasm of upper lobe, left bronchus or lung  Cancer Staging: Non-Small Cell Carcinoma of LUL clinical stage T1c, No, M0  Intent: Curative  Radiation Treatment Dates: 11/01/2019 through 11/10/2019 Site Technique Total Dose (Gy) Dose per Fx (Gy) Completed Fx Beam Energies  Thorax: Lung_Lt IMRT 54/54 18 3/3 6XFFF   Narrative: The patient tolerated radiation therapy relatively well. He denied any radiation-related side effects throughout treatment.  Plan: The patient will follow-up with radiation oncology in one month.  ________________________________________________   Billie Lade, PhD, MD  This document serves as a record of services personally performed by Antony Blackbird, MD. It was created on his behalf by Nikki Dom, a trained medical scribe. The creation of this record is based on the scribe's personal observations and the provider's statements to them. This document has been checked and approved by the attending provider.

## 2019-11-23 DIAGNOSIS — C049 Malignant neoplasm of floor of mouth, unspecified: Secondary | ICD-10-CM | POA: Diagnosis not present

## 2019-12-12 ENCOUNTER — Encounter: Payer: Self-pay | Admitting: Radiation Oncology

## 2019-12-12 ENCOUNTER — Ambulatory Visit
Admission: RE | Admit: 2019-12-12 | Discharge: 2019-12-12 | Disposition: A | Discharge status: Home / Self Care | Payer: Medicare Other | Source: Ambulatory Visit | Attending: Radiation Oncology | Admitting: Radiation Oncology

## 2019-12-12 ENCOUNTER — Other Ambulatory Visit: Payer: Self-pay

## 2019-12-12 VITALS — BP 121/74 | HR 93 | Temp 98.3°F | Resp 18 | Ht 74.0 in | Wt 150.4 lb

## 2019-12-12 DIAGNOSIS — Z923 Personal history of irradiation: Secondary | ICD-10-CM | POA: Diagnosis not present

## 2019-12-12 DIAGNOSIS — Z7984 Long term (current) use of oral hypoglycemic drugs: Secondary | ICD-10-CM | POA: Insufficient documentation

## 2019-12-12 DIAGNOSIS — C3412 Malignant neoplasm of upper lobe, left bronchus or lung: Secondary | ICD-10-CM | POA: Diagnosis not present

## 2019-12-12 DIAGNOSIS — Z79899 Other long term (current) drug therapy: Secondary | ICD-10-CM | POA: Insufficient documentation

## 2019-12-12 NOTE — Progress Notes (Signed)
Radiation Oncology         [phone]) [phone] ________________________________  Name: Lance [name] MRN: [id number]  Date: 12/12/2019  DOB: 03-19-1951  Follow-Up Visit Note  CC: [name], MD  [name], MD    ICD-10-CM   1. Primary cancer of left upper lobe of lung (HCC)  C34.12     Diagnosis: Non-Small Cell Carcinoma of LULclinical stage T1c, No, M0  Interval Since Last Radiation: One month and one day.  Radiation Treatment Dates: 11/01/2019 through 11/10/2019 Site Technique Total Dose (Gy) Dose per Fx (Gy) Completed Fx Beam Energies  Thorax: Lung_Lt IMRT 54/54 18 3/3 6XFFF    Narrative:  The patient returns today for routine follow-up. Patient is doing well overall. No significant interval history since end of treatment.  On review of systems, patient reports no significant cough or hemoptysis.. Patient denies pain within the chest..                        ALLERGIES:  has No Known Allergies.  Meds: Current Outpatient Medications  Medication Sig Dispense Refill  . albuterol (PROVENTIL HFA;VENTOLIN HFA) 108 (90 Base) MCG/ACT inhaler Inhale 1 puff into the lungs every 6 (six) hours as needed for wheezing or shortness of breath.    . baclofen (LIORESAL) 10 MG tablet Take 10 mg by mouth at bedtime.     . canagliflozin (INVOKANA) 100 MG TABS tablet Take 100 mg by mouth daily before breakfast.    . CISPLATIN IV Inject into the vein once a week.    . colchicine 0.6 MG tablet Take 0.6 mg by mouth 2 (two) times daily.     . diphenhydramine-acetaminophen (TYLENOL PM) 25-500 MG TABS tablet Take 1 tablet by mouth at bedtime as needed.    . DULoxetine (CYMBALTA) 30 MG capsule Take 30 mg by mouth at bedtime.     . fenofibrate 160 MG tablet Take 160 mg by mouth daily.    Marland Kitchen gabapentin (NEURONTIN) 800 MG tablet Take 800 mg by mouth 3 (three) times daily.    Marland Kitchen lidocaine (XYLOCAINE) 2 % solution 10 mL by Mouth route every four (4) hours as needed. Gargle gently and swallow    .  lidocaine-prilocaine (EMLA) cream Apply a small amount to port a cath site and cover with plastic wrap one hour prior to chemotherapy appointments 30 g 3  . lisinopril (PRINIVIL,ZESTRIL) 10 MG tablet Take 10 mg by mouth daily.    . metFORMIN (GLUCOPHAGE) 1000 MG tablet Take 1,000 mg by mouth 2 (two) times daily with a meal.    . Nutritional Supplements (FEEDING SUPPLEMENT, GLUCERNA 1.5 CAL,) LIQD Give 2 cartons at 8am, noon and 4pm and 1 carton at 8pm via feeding tube (total 7 cartons daily).  Flush with water before and water after. 1659 mL 2  . omeprazole (PRILOSEC) 20 MG capsule Take 20 mg by mouth daily.    Marland Kitchen oxyCODONE ER (XTAMPZA ER) 13.5 MG C12A TAKE 1 CAPSULE BY MOUTH EVERY TWELVE HOURS IF TOLERATED    . Oxycodone HCl 20 MG TABS 10-20mg  by mouth 3-5 times per day    . pantoprazole (PROTONIX) 40 MG tablet Take 40 mg by mouth daily.     . pravastatin (PRAVACHOL) 10 MG tablet Take 10 mg by mouth daily with supper.     . morphine (ROXANOL) 20 MG/ML concentrated solution Take 0.5 mLs (10 mg total) by mouth every 8 (eight) hours as needed for severe pain. (  Patient not taking: Reported on 09/22/2019) 30 mL 0  . naloxone (NARCAN) 2 MG/2ML injection Place 1 mg into the nose as needed (for opiod overdose).     Marland Kitchen nystatin (MYCOSTATIN) 100000 UNIT/ML suspension Take 5 mLs (500,000 Units total) by mouth 4 (four) times daily. Swish for 5 minutes. (Patient not taking: Reported on 08/22/2019) 60 mL 0  . prochlorperazine (COMPAZINE) 10 MG tablet Take 1 tablet (10 mg total) by mouth every 6 (six) hours as needed (Nausea or vomiting). (Patient not taking: Reported on 08/22/2019) 30 tablet 1   No current facility-administered medications for this encounter.    Physical Findings: The patient is in no acute distress. Patient is alert and oriented.  height is 6\' 2"  (1.88 m) and weight is 150 lb 6 oz (68.2 kg). His temporal temperature is 98.3 F (36.8 C). His blood pressure is 121/74 and his pulse is  93. His respiration is 18 and oxygen saturation is 100%. .  No significant changes. Lungs are clear to auscultation bilaterally. Heart has regular rate and rhythm. No palpable cervical, supraclavicular, or axillary adenopathy. Abdomen soft, non-tender, normal bowel sounds.   Lab Findings: Lab Results  Component Value Date   WBC 7.6 09/19/2019   HGB 11.5 (L) 09/19/2019   HCT 36.4 (L) 09/19/2019   MCV 104.0 (H) 09/19/2019   PLT 206 09/19/2019    Radiographic Findings: No results found.  Impression: Non-Small Cell Carcinoma of LULclinical stage T1c, No, M0  The patient is recovering from the effects of radiation.  Overall tolerated his stereotactic body radiation therapy well without any significant  side effects  Plan: Patient will follow-up with radiation oncology in three months.  Prior to this visit he will undergo a chest CT scan.  ____________________________________   Billie Lade, PhD, MD  This document serves as a record of services personally performed by Antony Blackbird, MD. It was created on his behalf by Nikki Dom, a trained medical scribe. The creation of this record is based on the scribe's personal observations and the provider's statements to them. This document has been checked and approved by the attending provider.

## 2019-12-12 NOTE — Progress Notes (Signed)
Pt presents today for one month f/u with Dr. Sondra Come. Pt reports fatigue that lasted approximately one week after SBRT.  Pt reports he still does not have much energy but states that is not new. Pt reports only cough is associated with jaw and recent treatment for HN cancer. Pt denies any new SOB. Pt reports he takes most of nutrition via feeding tube. Pt can eat soft foods but needs to be careful on what he eats.   BP 121/74 (BP Location: Left Arm, Patient Position: Sitting)   Pulse 93   Temp 98.3 F (36.8 C) (Temporal)   Resp 18   Ht 6\' 2"  (1.88 m)   Wt 150 lb 6 oz (68.2 kg)   SpO2 100%   BMI 19.31 kg/m   Wt Readings from Last 3 Encounters:  12/12/19 150 lb 6 oz (68.2 kg)  10/13/19 149 lb 6 oz (67.8 kg)  09/22/19 150 lb 4.8 oz (68.2 kg)   Loma Sousa, RN BSN

## 2019-12-12 NOTE — Patient Instructions (Signed)
Coronavirus (COVID-19) Are you at risk?  Are you at risk for the Coronavirus (COVID-19)?  To be considered HIGH RISK for Coronavirus (COVID-19), you have to meet the following criteria:  . Traveled to [place], [place], [place], [place] or [place]; or in the [place] to [place], [place], [place], or [place]; and have fever, cough, and shortness of breath within the last 2 weeks of travel OR . Been in close contact with a person diagnosed with COVID-19 within the last 2 weeks and have fever, cough, and shortness of breath . IF YOU DO NOT MEET THESE CRITERIA, YOU ARE CONSIDERED LOW RISK FOR COVID-19.  What to do if you are HIGH RISK for COVID-19?  . If you are having a medical emergency, call 911. . Seek medical care right away. Before you go to a doctor's office, urgent care or emergency department, call ahead and tell them about your recent travel, contact with someone diagnosed with COVID-19, and your symptoms. You should receive instructions from your physician's office regarding next steps of care.  . When you arrive at healthcare provider, tell the healthcare staff immediately you have returned from visiting [place], [place], [place], [place] or [place]; or traveled in the [place] to [place], [place], [place], or [place]; in the last two weeks or you have been in close contact with a person diagnosed with COVID-19 in the last 2 weeks.   . Tell the health care staff about your symptoms: fever, cough and shortness of breath. . After you have been seen by a medical provider, you will be either: o Tested for (COVID-19) and discharged home on quarantine except to seek medical care if symptoms worsen, and asked to  - Stay home and avoid contact with others until you get your results (4-5 days)  - Avoid travel on public transportation if possible (such as bus, train, or airplane) or o Sent to the Emergency Department by EMS for evaluation, COVID-19 testing, and possible  admission depending on your condition and test results.  What to do if you are LOW RISK for COVID-19?  Reduce your risk of any infection by using the same precautions used for avoiding the common cold or flu:  . Wash your hands often with soap and warm water for at least 20 seconds.  If soap and water are not readily available, use an alcohol-based hand sanitizer with at least 60% alcohol.  . If coughing or sneezing, cover your mouth and nose by coughing or sneezing into the elbow areas of your shirt or coat, into a tissue or into your sleeve (not your hands). . Avoid shaking hands with others and consider head nods or verbal greetings only. . Avoid touching your eyes, nose, or mouth with unwashed hands.  . Avoid close contact with people who are sick. . Avoid places or events with large numbers of people in one location, like concerts or sporting events. . Carefully consider travel plans you have or are making. . If you are planning any travel outside or inside the [place], visit the CDC's Travelers' Health webpage for the latest health notices. . If you have some symptoms but not all symptoms, continue to monitor at home and seek medical attention if your symptoms worsen. . If you are having a medical emergency, call 911.   ADDITIONAL HEALTHCARE OPTIONS FOR PATIENTS  National Telehealth / e-Visit: [email or web address]         [facility]: [phone]  [facility]: [phone]                   [facility]: [phone]   

## 2019-12-13 DIAGNOSIS — R5383 Other fatigue: Secondary | ICD-10-CM | POA: Diagnosis not present

## 2019-12-13 DIAGNOSIS — Z1331 Encounter for screening for depression: Secondary | ICD-10-CM | POA: Diagnosis not present

## 2019-12-13 DIAGNOSIS — Z299 Encounter for prophylactic measures, unspecified: Secondary | ICD-10-CM | POA: Diagnosis not present

## 2019-12-13 DIAGNOSIS — I10 Essential (primary) hypertension: Secondary | ICD-10-CM | POA: Diagnosis not present

## 2019-12-13 DIAGNOSIS — E1165 Type 2 diabetes mellitus with hyperglycemia: Secondary | ICD-10-CM | POA: Diagnosis not present

## 2019-12-13 DIAGNOSIS — Z1339 Encounter for screening examination for other mental health and behavioral disorders: Secondary | ICD-10-CM | POA: Diagnosis not present

## 2019-12-13 DIAGNOSIS — E7800 Pure hypercholesterolemia, unspecified: Secondary | ICD-10-CM | POA: Diagnosis not present

## 2019-12-13 DIAGNOSIS — Z6821 Body mass index (BMI) 21.0-21.9, adult: Secondary | ICD-10-CM | POA: Diagnosis not present

## 2019-12-13 DIAGNOSIS — E119 Type 2 diabetes mellitus without complications: Secondary | ICD-10-CM | POA: Diagnosis not present

## 2019-12-13 DIAGNOSIS — G894 Chronic pain syndrome: Secondary | ICD-10-CM | POA: Diagnosis not present

## 2019-12-13 DIAGNOSIS — Z7189 Other specified counseling: Secondary | ICD-10-CM | POA: Diagnosis not present

## 2019-12-13 DIAGNOSIS — Z0000 Encounter for general adult medical examination without abnormal findings: Secondary | ICD-10-CM | POA: Diagnosis not present

## 2019-12-13 DIAGNOSIS — Z1211 Encounter for screening for malignant neoplasm of colon: Secondary | ICD-10-CM | POA: Diagnosis not present

## 2019-12-20 DIAGNOSIS — Z6820 Body mass index (BMI) 20.0-20.9, adult: Secondary | ICD-10-CM | POA: Diagnosis not present

## 2019-12-20 DIAGNOSIS — E1165 Type 2 diabetes mellitus with hyperglycemia: Secondary | ICD-10-CM | POA: Diagnosis not present

## 2019-12-20 DIAGNOSIS — L089 Local infection of the skin and subcutaneous tissue, unspecified: Secondary | ICD-10-CM | POA: Diagnosis not present

## 2019-12-20 DIAGNOSIS — Z87891 Personal history of nicotine dependence: Secondary | ICD-10-CM | POA: Diagnosis not present

## 2019-12-20 DIAGNOSIS — Z299 Encounter for prophylactic measures, unspecified: Secondary | ICD-10-CM | POA: Diagnosis not present

## 2019-12-20 DIAGNOSIS — E1142 Type 2 diabetes mellitus with diabetic polyneuropathy: Secondary | ICD-10-CM | POA: Diagnosis not present

## 2019-12-20 DIAGNOSIS — J449 Chronic obstructive pulmonary disease, unspecified: Secondary | ICD-10-CM | POA: Diagnosis not present

## 2019-12-20 DIAGNOSIS — I10 Essential (primary) hypertension: Secondary | ICD-10-CM | POA: Diagnosis not present

## 2019-12-22 ENCOUNTER — Telehealth (HOSPITAL_COMMUNITY): Payer: Self-pay

## 2019-12-22 ENCOUNTER — Inpatient Hospital Stay (HOSPITAL_COMMUNITY): Payer: Medicare Other | Attending: Hematology | Admitting: Hematology

## 2019-12-22 ENCOUNTER — Other Ambulatory Visit: Payer: Self-pay

## 2019-12-22 ENCOUNTER — Inpatient Hospital Stay (HOSPITAL_COMMUNITY): Payer: Medicare Other

## 2019-12-22 VITALS — BP 103/61 | HR 78 | Temp 97.5°F | Resp 18 | Wt 154.2 lb

## 2019-12-22 DIAGNOSIS — Z95828 Presence of other vascular implants and grafts: Secondary | ICD-10-CM

## 2019-12-22 DIAGNOSIS — Z85118 Personal history of other malignant neoplasm of bronchus and lung: Secondary | ICD-10-CM | POA: Insufficient documentation

## 2019-12-22 DIAGNOSIS — C049 Malignant neoplasm of floor of mouth, unspecified: Secondary | ICD-10-CM | POA: Diagnosis not present

## 2019-12-22 DIAGNOSIS — C109 Malignant neoplasm of oropharynx, unspecified: Secondary | ICD-10-CM

## 2019-12-22 DIAGNOSIS — Z85819 Personal history of malignant neoplasm of unspecified site of lip, oral cavity, and pharynx: Secondary | ICD-10-CM | POA: Insufficient documentation

## 2019-12-22 LAB — CBC WITH DIFFERENTIAL/PLATELET
Abs Immature Granulocytes: 0.03 10*3/uL (ref 0.00–0.07)
Basophils Absolute: 0.1 10*3/uL (ref 0.0–0.1)
Basophils Relative: 1 %
Eosinophils Absolute: 0.5 10*3/uL (ref 0.0–0.5)
Eosinophils Relative: 6 %
HCT: 37.7 % — ABNORMAL LOW (ref 39.0–52.0)
Hemoglobin: 12.1 g/dL — ABNORMAL LOW (ref 13.0–17.0)
Immature Granulocytes: 0 %
Lymphocytes Relative: 10 %
Lymphs Abs: 0.9 10*3/uL (ref 0.7–4.0)
MCH: 32.4 pg (ref 26.0–34.0)
MCHC: 32.1 g/dL (ref 30.0–36.0)
MCV: 100.8 fL — ABNORMAL HIGH (ref 80.0–100.0)
Monocytes Absolute: 0.8 10*3/uL (ref 0.1–1.0)
Monocytes Relative: 9 %
Neutro Abs: 6.6 10*3/uL (ref 1.7–7.7)
Neutrophils Relative %: 74 %
Platelets: 304 10*3/uL (ref 150–400)
RBC: 3.74 MIL/uL — ABNORMAL LOW (ref 4.22–5.81)
RDW: 13.7 % (ref 11.5–15.5)
WBC: 8.8 10*3/uL (ref 4.0–10.5)
nRBC: 0 % (ref 0.0–0.2)

## 2019-12-22 LAB — COMPREHENSIVE METABOLIC PANEL
ALT: 18 U/L (ref 0–44)
AST: 16 U/L (ref 15–41)
Albumin: 4.1 g/dL (ref 3.5–5.0)
Alkaline Phosphatase: 97 U/L (ref 38–126)
Anion gap: 7 (ref 5–15)
BUN: 20 mg/dL (ref 8–23)
CO2: 27 mmol/L (ref 22–32)
Calcium: 9.7 mg/dL (ref 8.9–10.3)
Chloride: 99 mmol/L (ref 98–111)
Creatinine, Ser: 0.54 mg/dL — ABNORMAL LOW (ref 0.61–1.24)
GFR calc Af Amer: >60 mL/min (ref >60)
GFR calc non Af Amer: >60 mL/min (ref >60)
Glucose, Bld: 103 mg/dL — ABNORMAL HIGH (ref 70–99)
Potassium: 5 mmol/L (ref 3.5–5.1)
Sodium: 133 mmol/L — ABNORMAL LOW (ref 135–145)
Total Bilirubin: 0.5 mg/dL (ref 0.3–1.2)
Total Protein: 7.2 g/dL (ref 6.5–8.1)

## 2019-12-22 NOTE — Patient Instructions (Signed)
[facility] at [facility] Discharge Instructions  You were seen today by Dr. Delton Coombes. He went over your recent lab results. He will see you back in 3 months for labs, PET scan and follow up.   Thank you for choosing [facility] to provide your oncology and hematology care.  To afford each patient quality time with our provider, please arrive at least 15 minutes before your scheduled appointment time.   If you have a lab appointment with the [facility] please come in thru the  Main Entrance and check in at the main information desk  You need to re-schedule your appointment should you arrive 10 or more minutes late.  We strive to give you quality time with our providers, and arriving late affects you and other patients whose appointments are after yours.  Also, if you no show three or more times for appointments you may be dismissed from the clinic at the providers discretion.     Again, thank you for choosing [facility].  Our hope is that these requests will decrease the amount of time that you wait before being seen by our physicians.       _____________________________________________________________  Should you have questions after your visit to [facility], please contact our office at [phone] between the hours of 8:00 a.m. and 4:30 p.m.  Voicemails left after 4:00 p.m. will not be returned until the following business day.  For prescription refill requests, have your pharmacy contact our office and allow 72 hours.    Cancer Center Support Programs:   > Cancer Support Group  2nd Tuesday of the month 1pm-2pm, Journey Room
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
## 2019-12-22 NOTE — Telephone Encounter (Signed)
Nutrition Follow-up:  Request from Lance [name] to call patient today.  RD at [facility] location.  Patient with stage IV SCC of the mouth.  Patient has completed radiation and chemotherapy.  Patient s/p surgical resection on 02/2019 at [facility].    Spoke with patient via phone for nutrition follow-up.  Patient reports that he has been giving 5 glucerna 1.5 via feeding tube.  Flushing with at least 63ml of water before and after.  "Some times I give more water through the tube."  Reports that he has been eating orally as well.  Patient is able to tolerate mostly soft foods potatoes, potato salad, pinto beans, greens, cheese, eggs,  spaghetti with no meat.  Has a hard time chewing meats.    Medications: reviewed  Labs: Na 133, K 5.0, BUN 20, creatinine 0.54  Anthropometrics:   Weight 154 lb 3.2 oz today in clinic at cancer center.  Increased from 150 lb 4.8 oz on 10/15   Estimated Energy Needs  Kcals: 2000-2400 Protein: 100-120 g Fluid: 2.4 L  NUTRITION DIAGNOSIS: Inadequate oral intake improving   INTERVENTION:  Patient planning on decreasing glucerna 1.5 to 4 cartons per day. Continue to flush with at least 75ml of water before and after feeding.   Tube feeding provides: 1424 calories, 78 g protein and 774ml free water Patient aware that water flush will not be enough to meet hydration needs.  Will need to drink additional fluids.   Discussed soft moist foods high in calories and protein.  Discussed alternative protein sources to meat.   Patient has contact information.     MONITORING, EVALUATION, GOAL: Patient will consume adequate calories and protein to maintain weight    NEXT VISIT: phone f/u Feb 5th  Lance [name], [place], [place] Registered Dietitian [phone] (pager)

## 2019-12-23 ENCOUNTER — Encounter (HOSPITAL_COMMUNITY): Payer: Medicare Other

## 2019-12-27 DIAGNOSIS — I10 Essential (primary) hypertension: Secondary | ICD-10-CM | POA: Diagnosis not present

## 2019-12-27 DIAGNOSIS — Z6820 Body mass index (BMI) 20.0-20.9, adult: Secondary | ICD-10-CM | POA: Diagnosis not present

## 2019-12-27 DIAGNOSIS — E1142 Type 2 diabetes mellitus with diabetic polyneuropathy: Secondary | ICD-10-CM | POA: Diagnosis not present

## 2019-12-27 DIAGNOSIS — J449 Chronic obstructive pulmonary disease, unspecified: Secondary | ICD-10-CM | POA: Diagnosis not present

## 2019-12-27 DIAGNOSIS — E1165 Type 2 diabetes mellitus with hyperglycemia: Secondary | ICD-10-CM | POA: Diagnosis not present

## 2019-12-27 DIAGNOSIS — Z87891 Personal history of nicotine dependence: Secondary | ICD-10-CM | POA: Diagnosis not present

## 2019-12-27 DIAGNOSIS — Z299 Encounter for prophylactic measures, unspecified: Secondary | ICD-10-CM | POA: Diagnosis not present

## 2019-12-28 DIAGNOSIS — M009 Pyogenic arthritis, unspecified: Secondary | ICD-10-CM | POA: Diagnosis not present

## 2019-12-29 DIAGNOSIS — J449 Chronic obstructive pulmonary disease, unspecified: Secondary | ICD-10-CM | POA: Diagnosis not present

## 2019-12-29 DIAGNOSIS — E7800 Pure hypercholesterolemia, unspecified: Secondary | ICD-10-CM | POA: Diagnosis not present

## 2019-12-29 DIAGNOSIS — E119 Type 2 diabetes mellitus without complications: Secondary | ICD-10-CM | POA: Diagnosis not present

## 2019-12-30 DIAGNOSIS — Z01818 Encounter for other preprocedural examination: Secondary | ICD-10-CM | POA: Diagnosis not present

## 2019-12-30 DIAGNOSIS — Z23 Encounter for immunization: Secondary | ICD-10-CM | POA: Diagnosis not present

## 2019-12-31 NOTE — Assessment & Plan Note (Signed)
1.  Stage IVa (PT4APN1) squamous cell carcinoma of the floor of the mouth: -Weekly cisplatin and radiation from 06/15/2019 through 07/27/2019. -He is eating soft foods by mouth. -PET scan on 09/19/2019 did not show any recurrence. -Physical examination today did not reveal any signs of recurrence.  His labs are grossly normal.  TSH in October was normal. -We will see him back in 3 months with repeat PET scan.  I will plan to repeat TSH at that time.  2.  Nutrition: -He has cut back on tube feeds to 5 cans of Osmolite daily. -He is eating soft foods 2-3 times a day.  Weight has been stable.  3.  Non-small cell lung cancer: -Navigational bronchoscopy and biopsy by Dr. Roxan Hockey on 05/16/2019 was consistent with non-small cell lung cancer. -PET scan on 09/19/2019 showed left upper lobe lung lesion with SUV 2.5. -He received SBRT from 11/01/2019 through 11/10/2019, 54 grain 3 fractions. -I plan to repeat PET scan in 3 months.

## 2020-01-03 DIAGNOSIS — E119 Type 2 diabetes mellitus without complications: Secondary | ICD-10-CM | POA: Diagnosis not present

## 2020-01-03 DIAGNOSIS — L03012 Cellulitis of left finger: Secondary | ICD-10-CM | POA: Diagnosis not present

## 2020-01-03 DIAGNOSIS — B9562 Methicillin resistant Staphylococcus aureus infection as the cause of diseases classified elsewhere: Secondary | ICD-10-CM | POA: Diagnosis not present

## 2020-01-03 DIAGNOSIS — M1990 Unspecified osteoarthritis, unspecified site: Secondary | ICD-10-CM | POA: Diagnosis not present

## 2020-01-03 DIAGNOSIS — L02512 Cutaneous abscess of left hand: Secondary | ICD-10-CM | POA: Diagnosis not present

## 2020-01-03 DIAGNOSIS — I10 Essential (primary) hypertension: Secondary | ICD-10-CM | POA: Diagnosis not present

## 2020-01-03 DIAGNOSIS — S61211D Laceration without foreign body of left index finger without damage to nail, subsequent encounter: Secondary | ICD-10-CM | POA: Diagnosis not present

## 2020-01-03 DIAGNOSIS — Z87891 Personal history of nicotine dependence: Secondary | ICD-10-CM | POA: Diagnosis not present

## 2020-01-03 DIAGNOSIS — Z930 Tracheostomy status: Secondary | ICD-10-CM | POA: Diagnosis not present

## 2020-01-03 DIAGNOSIS — J449 Chronic obstructive pulmonary disease, unspecified: Secondary | ICD-10-CM | POA: Diagnosis not present

## 2020-01-03 DIAGNOSIS — Z85118 Personal history of other malignant neoplasm of bronchus and lung: Secondary | ICD-10-CM | POA: Diagnosis not present

## 2020-01-03 DIAGNOSIS — W260XXD Contact with knife, subsequent encounter: Secondary | ICD-10-CM | POA: Diagnosis not present

## 2020-01-10 DIAGNOSIS — G894 Chronic pain syndrome: Secondary | ICD-10-CM | POA: Diagnosis not present

## 2020-01-13 ENCOUNTER — Ambulatory Visit (HOSPITAL_COMMUNITY): Payer: Medicare Other

## 2020-01-13 NOTE — Progress Notes (Signed)
Nutrition Follow-up:  Patient with stage IV SCC of the mouth.  Patient has completed radiation and chemotherapy.  Patient s/p surgical resection on 02/2019 at [facility].    Spoke with patient via phone for nutrition follow-up.  Patient reports that he has been giving 4-5 cartons of glucerna 1.5 via feeding tube.  Flushes with 67ml before and 188ml after to keep tube cleaner. Reports that he feels full with eating and giving tube feeding.   Reports that he is eating mostly soft foods (gravy biscuit, beans and cornbread, drinking milk, eggs). Reports that he is not able to eat meats as to hard to chew.  Has drank some of the ensure (strawberry shakes) but not daily.  Reports that he gets choked when eating orally or drinking.  Stressed importance of being evaluated by SLP.  Patient did not call SLP back to set up appointment.      Medications: reviewed  Labs: no new  Anthropometrics:   Weight per patient report 150-152 lb.  Last weight in cancer center 154 lb 3.2 oz   Estimated Energy Needs  Kcals: 2000-2400 Protein: 100-120 g Fluid: 2.4 L  NUTRITION DIAGNOSIS: Inadequate oral intake stable   INTERVENTION: Stressed importance of getting evaluated by SLP.  Patient did not return call from SLP to set up appointment. Patient will give 3-4 cartons of glucerna 1.5 via feeding tube and monitor weight and oral intake.  Continue water flush of 9ml before and 137ml after.  Patient continues to drink fluids orally.   Patient will monitor weight on home scales.   Patient has contact information    MONITORING, EVALUATION, GOAL: patient will consume adequate calories and protein to maintain weight   NEXT VISIT: phone f/u March 5th  Andrewjames Weirauch B. Zenia Resides, [place], [place] Registered Dietitian [phone] (pager)

## 2020-02-02 DIAGNOSIS — M009 Pyogenic arthritis, unspecified: Secondary | ICD-10-CM | POA: Diagnosis not present

## 2020-02-02 DIAGNOSIS — Z4802 Encounter for removal of sutures: Secondary | ICD-10-CM | POA: Diagnosis not present

## 2020-02-02 DIAGNOSIS — M25642 Stiffness of left hand, not elsewhere classified: Secondary | ICD-10-CM | POA: Diagnosis not present

## 2020-02-07 DIAGNOSIS — M5030 Other cervical disc degeneration, unspecified cervical region: Secondary | ICD-10-CM | POA: Diagnosis not present

## 2020-02-07 DIAGNOSIS — M25519 Pain in unspecified shoulder: Secondary | ICD-10-CM | POA: Diagnosis not present

## 2020-02-07 DIAGNOSIS — E11610 Type 2 diabetes mellitus with diabetic neuropathic arthropathy: Secondary | ICD-10-CM | POA: Diagnosis not present

## 2020-02-07 DIAGNOSIS — Z5181 Encounter for therapeutic drug level monitoring: Secondary | ICD-10-CM | POA: Diagnosis not present

## 2020-02-07 DIAGNOSIS — M792 Neuralgia and neuritis, unspecified: Secondary | ICD-10-CM | POA: Diagnosis not present

## 2020-02-07 DIAGNOSIS — M25559 Pain in unspecified hip: Secondary | ICD-10-CM | POA: Diagnosis not present

## 2020-02-07 DIAGNOSIS — R5382 Chronic fatigue, unspecified: Secondary | ICD-10-CM | POA: Diagnosis not present

## 2020-02-07 DIAGNOSIS — M48062 Spinal stenosis, lumbar region with neurogenic claudication: Secondary | ICD-10-CM | POA: Diagnosis not present

## 2020-02-07 DIAGNOSIS — M5136 Other intervertebral disc degeneration, lumbar region: Secondary | ICD-10-CM | POA: Diagnosis not present

## 2020-02-07 DIAGNOSIS — G894 Chronic pain syndrome: Secondary | ICD-10-CM | POA: Diagnosis not present

## 2020-02-07 DIAGNOSIS — M4317 Spondylolisthesis, lumbosacral region: Secondary | ICD-10-CM | POA: Diagnosis not present

## 2020-02-07 DIAGNOSIS — M5416 Radiculopathy, lumbar region: Secondary | ICD-10-CM | POA: Diagnosis not present

## 2020-02-07 DIAGNOSIS — Z79891 Long term (current) use of opiate analgesic: Secondary | ICD-10-CM | POA: Diagnosis not present

## 2020-02-07 DIAGNOSIS — F17210 Nicotine dependence, cigarettes, uncomplicated: Secondary | ICD-10-CM | POA: Diagnosis not present

## 2020-02-10 ENCOUNTER — Ambulatory Visit (HOSPITAL_COMMUNITY): Payer: Medicare Other

## 2020-02-10 NOTE — Progress Notes (Signed)
Nutrition  Called patient for nutrition follow-up and no answer.  Left message with call back number.   Mahima Hottle B. Zenia Resides, [place], [place] Registered Dietitian [phone] (pager)

## 2020-02-22 DIAGNOSIS — J449 Chronic obstructive pulmonary disease, unspecified: Secondary | ICD-10-CM | POA: Diagnosis not present

## 2020-02-22 DIAGNOSIS — E7800 Pure hypercholesterolemia, unspecified: Secondary | ICD-10-CM | POA: Diagnosis not present

## 2020-02-22 DIAGNOSIS — E119 Type 2 diabetes mellitus without complications: Secondary | ICD-10-CM | POA: Diagnosis not present

## 2020-03-06 DIAGNOSIS — M109 Gout, unspecified: Secondary | ICD-10-CM | POA: Diagnosis not present

## 2020-03-06 DIAGNOSIS — G894 Chronic pain syndrome: Secondary | ICD-10-CM | POA: Diagnosis not present

## 2020-03-06 DIAGNOSIS — M5136 Other intervertebral disc degeneration, lumbar region: Secondary | ICD-10-CM | POA: Diagnosis not present

## 2020-03-06 DIAGNOSIS — M1990 Unspecified osteoarthritis, unspecified site: Secondary | ICD-10-CM | POA: Diagnosis not present

## 2020-03-08 DIAGNOSIS — Z23 Encounter for immunization: Secondary | ICD-10-CM | POA: Diagnosis not present

## 2020-03-12 ENCOUNTER — Ambulatory Visit: Payer: Self-pay | Admitting: Radiation Oncology

## 2020-03-15 ENCOUNTER — Ambulatory Visit: Payer: Medicare Other | Admitting: Radiation Oncology

## 2020-03-19 ENCOUNTER — Other Ambulatory Visit (HOSPITAL_COMMUNITY): Payer: Medicare Other

## 2020-03-19 ENCOUNTER — Other Ambulatory Visit: Payer: Self-pay

## 2020-03-19 ENCOUNTER — Inpatient Hospital Stay (HOSPITAL_COMMUNITY): Payer: Medicare Other | Attending: Hematology

## 2020-03-19 ENCOUNTER — Ambulatory Visit (HOSPITAL_COMMUNITY)
Admission: RE | Admit: 2020-03-19 | Discharge: 2020-03-19 | Disposition: A | Discharge status: Home / Self Care | Payer: Medicare Other | Source: Ambulatory Visit | Attending: Radiation Oncology | Admitting: Radiation Oncology

## 2020-03-19 DIAGNOSIS — C3412 Malignant neoplasm of upper lobe, left bronchus or lung: Secondary | ICD-10-CM | POA: Diagnosis not present

## 2020-03-19 DIAGNOSIS — I10 Essential (primary) hypertension: Secondary | ICD-10-CM | POA: Insufficient documentation

## 2020-03-19 DIAGNOSIS — C3490 Malignant neoplasm of unspecified part of unspecified bronchus or lung: Secondary | ICD-10-CM | POA: Diagnosis not present

## 2020-03-19 DIAGNOSIS — Z85818 Personal history of malignant neoplasm of other sites of lip, oral cavity, and pharynx: Secondary | ICD-10-CM | POA: Insufficient documentation

## 2020-03-21 ENCOUNTER — Ambulatory Visit (HOSPITAL_COMMUNITY): Payer: Medicare Other | Admitting: Hematology

## 2020-03-21 NOTE — Progress Notes (Signed)
Radiation Oncology         [phone] ________________________________  Name: Lance [name] MRN: [id number]  Date: 03/22/2020  DOB: August 24, 1951  Follow-Up Visit Note  CC: [name], MD  Doreatha Massed, MD    ICD-10-CM   1. Primary cancer of left upper lobe of lung (HCC)  C34.12     Diagnosis: Non-Small Cell Carcinoma of LULclinical stage T1c, No, M0  Interval Since Last Radiation: Four months, one week, and five days.  Radiation Treatment Dates: 11/01/2019 through 11/10/2019 Site Technique Total Dose (Gy) Dose per Fx (Gy) Completed Fx Beam Energies  Thorax: Lung_Lt IMRT 54/54 18 3/3 6XFFF    Narrative:  The patient returns today for routine follow-up. Patient is doing well overall. Since his last visit, he was seen by Dr. 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  CT of chest on 03/19/2020 showed a response to therapy of the left upper lobe lung lesions. There was a new, more cephalad, well-circumscribed, 8 mm left upper lobe pulmonary nodule that was suspicious for metastatic disease from one the patient's primaries versus less likely a new primary. There was also a 4 mm right upper lobe pulmonary nodule that was likely new but nonspecific. Finally, there was a new right lower lobe mucous plugging, peri-bronchovascular ground-glass and micro-nodularity was mild and suspicious for interval aspiration or infection.  On review of systems, he reports thicken phlegm in throat from HN cancer, difficulty swallowing from HN cancer, and generalized arthritic pain rated 6-7/10. He denies cough and hemoptysis.  ALLERGIES:  has No Known Allergies.  Meds: Current Outpatient Medications  Medication Sig Dispense Refill  . albuterol (PROVENTIL HFA;VENTOLIN HFA) 108 (90 Base) MCG/ACT inhaler Inhale 1 puff into the lungs every 6 (six) hours as needed for wheezing or shortness of breath.    . baclofen (LIORESAL) 10 MG tablet  Take 10 mg by mouth at bedtime.     . canagliflozin (INVOKANA) 100 MG TABS tablet Take 100 mg by mouth daily before breakfast.    . CISPLATIN IV Inject into the vein once a week.    . colchicine 0.6 MG tablet Take 0.6 mg by mouth 2 (two) times daily.     . diphenhydramine-acetaminophen (TYLENOL PM) 25-500 MG TABS tablet Take 1 tablet by mouth at bedtime as needed.    . DULoxetine (CYMBALTA) 30 MG capsule Take 30 mg by mouth at bedtime.     . fenofibrate 160 MG tablet Take 160 mg by mouth daily.    Marland Kitchen gabapentin (NEURONTIN) 800 MG tablet Take 800 mg by mouth 3 (three) times daily.    Marland Kitchen lidocaine (XYLOCAINE) 2 % solution 10 mL by Mouth route every four (4) hours as needed. Gargle gently and swallow    . lisinopril (PRINIVIL,ZESTRIL) 10 MG tablet Take 10 mg by mouth daily.    . metFORMIN (GLUCOPHAGE) 1000 MG tablet Take 1,000 mg by mouth 2 (two) times daily with a meal.    . naloxone (NARCAN) 2 MG/2ML injection Place 1 mg into the nose as needed (for opiod overdose).     . Nutritional Supplements (FEEDING SUPPLEMENT, GLUCERNA 1.5 CAL,) LIQD Give 2 cartons at 8am, noon and 4pm and 1 carton at 8pm via feeding tube (total 7 cartons daily).  Flush with water before and water after. 1659 mL 2  . omeprazole (PRILOSEC) 20 MG capsule Take 20 mg by mouth daily.    Marland Kitchen oxyCODONE  ER (XTAMPZA ER) 13.5 MG C12A TAKE 1 CAPSULE BY MOUTH EVERY TWELVE HOURS IF TOLERATED    . Oxycodone HCl 20 MG TABS 10-20mg  by mouth 3-5 times per day    . pantoprazole (PROTONIX) 40 MG tablet Take 40 mg by mouth daily.     . pravastatin (PRAVACHOL) 10 MG tablet Take 10 mg by mouth daily with supper.     . lidocaine-prilocaine (EMLA) cream Apply a small amount to port a cath site and cover with plastic wrap one hour prior to chemotherapy appointments (Patient not taking: Reported on 12/22/2019) 30 g 3  . morphine (ROXANOL) 20 MG/ML concentrated solution Take 0.5 mLs (10 mg total) by mouth every 8 (eight) hours as needed for  severe pain. (Patient not taking: Reported on 09/22/2019) 30 mL 0  . nystatin (MYCOSTATIN) 100000 UNIT/ML suspension Take 5 mLs (500,000 Units total) by mouth 4 (four) times daily. Swish for 5 minutes. (Patient not taking: Reported on 08/22/2019) 60 mL 0  . prochlorperazine (COMPAZINE) 10 MG tablet Take 1 tablet (10 mg total) by mouth every 6 (six) hours as needed (Nausea or vomiting). (Patient not taking: Reported on 08/22/2019) 30 tablet 1   No current facility-administered medications for this encounter.    Physical Findings: The patient is in no acute distress. Patient is alert and oriented.  height is 6\' 2"  (1.88 m) and weight is 141 lb 9.6 oz (64.2 kg). His temperature is 98.3 F (36.8 C). His blood pressure is 102/68 and his pulse is 79. His respiration is 20 and oxygen saturation is 94%.  No significant changes. Lungs are clear to auscultation bilaterally. Heart has regular rate and rhythm. No palpable cervical, supraclavicular, or axillary adenopathy. Abdomen soft, non-tender, normal bowel sounds.  Surgical changes noted in the neck region.  Lab Findings: Lab Results  Component Value Date   WBC 8.8 12/22/2019   HGB 12.1 (L) 12/22/2019   HCT 37.7 (L) 12/22/2019   MCV 100.8 (H) 12/22/2019   PLT 304 12/22/2019    Radiographic Findings: CT Chest Wo Contrast  Result Date: 03/19/2020 CLINICAL DATA:  Non-small-cell lung cancer. History of squamous cell carcinoma floor of mouth. Status post chemotherapy and radiation therapy. EXAM: CT CHEST WITHOUT CONTRAST TECHNIQUE: Multidetector CT imaging of the chest was performed following the standard protocol without IV contrast. COMPARISON:  09/19/2019 PET.  Most recent chest CT 05/03/2019. FINDINGS: Cardiovascular: Right Port-A-Cath tip at low SVC. Aortic atherosclerosis. Tortuous thoracic aorta. Normal heart size, without pericardial effusion. Multivessel coronary artery atherosclerosis. Mediastinum/Nodes: No mediastinal or definite hilar  adenopathy, given limitations of unenhanced CT. A prevascular node is similar at 5 mm and not pathologic by size criteria. Lungs/Pleura: No pleural fluid. Moderate centrilobular emphysema. Scattered small areas of ground-glass throughout the upper lobes and left lower lobe are nonspecific and may be smoking related. Peribronchovascular right lower lobe ground-glass and micronodularity, including on 147/4 is new since the prior CT. Mucous plugging within subtending bronchi, including on 137/4. A 4 mm right upper lobe pulmonary nodule is not readily apparent on 40/4 on the prior. A new rounded left upper lobe 8 mm nodule on 45/4. 2 mm more inferior left upper lobe pulmonary nodule on 57/4. The previously described dominant cavitary left upper lobe lesion is decreased in size, with resolution of previously described cavitation. Example 1.9 x 1.1 cm today versus 2.8 x 1.8 cm on the prior exam. More cephalad and anterior nodule measures 8 x 9 mm on 73/4 versus 9 x 17 mm on  the prior exam. Radiation fiducials are seen just medial to the smaller nodule. Airspace and ground-glass within the more peripheral left upper lobe, including on 71/4, are new and presumably radiation induced. Upper Abdomen: Normal imaged portions of the liver, spleen, stomach, kidneys. Adrenal thickening and nodularity is similar, low-density, and likely due to a combination of adenomas and hyperplasia. Musculoskeletal: Cervical spine fixation. Mid and lower thoracic spondylosis. IMPRESSION: 1. Response to therapy of left upper lobe lung lesions. 2. New more cephalad well-circumscribed left upper lobe pulmonary nodule of 8 mm, suspicious for metastatic disease from 1 of patient's primaries versus less likely a new primary. A 4 mm right upper lobe pulmonary nodule is likely new but nonspecific. 3. New right lower lobe mucous plugging, peribronchovascular ground-glass and micronodularity is mild and suspicious for interval aspiration or infection. 4.  No thoracic adenopathy. 5. Aortic atherosclerosis (ICD10-I70.0), coronary artery atherosclerosis and emphysema (ICD10-J43.9). Electronically Signed   By: Jeronimo Greaves M.D.   On: 03/19/2020 16:07    Impression: Non-Small Cell Carcinoma of LULclinical stage T1c, No, M0  Patient has a new lesion in the left upper lung superior to his previous stereotactic body radiation therapy treatment.  This could be metastatic disease from his head neck primary or a new primary lung cancer.  Patient did see medical oncology and has been scheduled for PET scan for further evaluation of this issue.  Patient will be seen after his upcoming PET scan in late April.  If this is a solitary lesion then he may be a potential candidate for an additional course of stereotactic body radiation therapy.  Plan: PET scan scheduled for 04/02/2020. The patient is scheduled to see Dr. Ellin Saba on 04/05/2020.  Follow-up in radiation oncology as above after his upcoming PET scan.  ____________________________________   Billie Lade, PhD, MD  This document serves as a record of services personally performed by Antony Blackbird, MD. It was created on his behalf by Nikki Dom, a trained medical scribe. The creation of this record is based on the scribe's personal observations and the provider's statements to them. This document has been checked and approved by the attending provider.

## 2020-03-22 ENCOUNTER — Ambulatory Visit
Admission: RE | Admit: 2020-03-22 | Discharge: 2020-03-22 | Disposition: A | Discharge status: Home / Self Care | Payer: Medicare Other | Source: Ambulatory Visit | Attending: Radiation Oncology | Admitting: Radiation Oncology

## 2020-03-22 ENCOUNTER — Other Ambulatory Visit: Payer: Self-pay

## 2020-03-22 ENCOUNTER — Encounter: Payer: Self-pay | Admitting: Radiation Oncology

## 2020-03-22 VITALS — BP 102/68 | HR 79 | Temp 98.3°F | Resp 20 | Ht 74.0 in | Wt 141.6 lb

## 2020-03-22 DIAGNOSIS — C049 Malignant neoplasm of floor of mouth, unspecified: Secondary | ICD-10-CM | POA: Diagnosis not present

## 2020-03-22 DIAGNOSIS — R918 Other nonspecific abnormal finding of lung field: Secondary | ICD-10-CM | POA: Diagnosis not present

## 2020-03-22 DIAGNOSIS — Z7984 Long term (current) use of oral hypoglycemic drugs: Secondary | ICD-10-CM | POA: Insufficient documentation

## 2020-03-22 DIAGNOSIS — I2510 Atherosclerotic heart disease of native coronary artery without angina pectoris: Secondary | ICD-10-CM | POA: Diagnosis not present

## 2020-03-22 DIAGNOSIS — Z79899 Other long term (current) drug therapy: Secondary | ICD-10-CM | POA: Insufficient documentation

## 2020-03-22 DIAGNOSIS — I700 Atherosclerosis of aorta: Secondary | ICD-10-CM | POA: Insufficient documentation

## 2020-03-22 DIAGNOSIS — Z9221 Personal history of antineoplastic chemotherapy: Secondary | ICD-10-CM | POA: Insufficient documentation

## 2020-03-22 DIAGNOSIS — Z923 Personal history of irradiation: Secondary | ICD-10-CM | POA: Diagnosis not present

## 2020-03-22 DIAGNOSIS — C3412 Malignant neoplasm of upper lobe, left bronchus or lung: Secondary | ICD-10-CM | POA: Diagnosis not present

## 2020-03-22 NOTE — Progress Notes (Signed)
Pt presents today for f/u with Dr. Sondra Come. Pt reports generalized arthritic pain, rated 6-7/10. Pt with thick phlegm in throat from HN cancer. Pt unable to take PO, takes all nutrition through PEG tube. Pt denies cough. Pt denies hemoptysis. Pt reports difficulty swallowing from HN cancer. When asked about SOB, pt replied "probably".  BP 102/68 (BP Location: Right Arm, Patient Position: Sitting, Cuff Size: Normal)   Pulse 79   Temp 98.3 F (36.8 C)   Resp 20   Ht 6\' 2"  (1.88 m)   Wt 141 lb 9.6 oz (64.2 kg)   SpO2 94%   BMI 18.18 kg/m   Wt Readings from Last 3 Encounters:  03/22/20 141 lb 9.6 oz (64.2 kg)  12/22/19 154 lb 3.2 oz (69.9 kg)  12/12/19 150 lb 6 oz (68.2 kg)   Loma Sousa, RN BSN

## 2020-03-22 NOTE — Patient Instructions (Signed)
Coronavirus (COVID-19) Are you at risk?  Are you at risk for the Coronavirus (COVID-19)?  To be considered HIGH RISK for Coronavirus (COVID-19), you have to meet the following criteria:  . Traveled to [place], [place], [place], [place] or [place]; or in the [place] to [place], [place], [place], or [place]; and have fever, cough, and shortness of breath within the last 2 weeks of travel OR . Been in close contact with a person diagnosed with COVID-19 within the last 2 weeks and have fever, cough, and shortness of breath . IF YOU DO NOT MEET THESE CRITERIA, YOU ARE CONSIDERED LOW RISK FOR COVID-19.  What to do if you are HIGH RISK for COVID-19?  . If you are having a medical emergency, call 911. . Seek medical care right away. Before you go to a doctor's office, urgent care or emergency department, call ahead and tell them about your recent travel, contact with someone diagnosed with COVID-19, and your symptoms. You should receive instructions from your physician's office regarding next steps of care.  . When you arrive at healthcare provider, tell the healthcare staff immediately you have returned from visiting [place], [place], [place], [place] or [place]; or traveled in the [place] to [place], [place], [place], or [place]; in the last two weeks or you have been in close contact with a person diagnosed with COVID-19 in the last 2 weeks.   . Tell the health care staff about your symptoms: fever, cough and shortness of breath. . After you have been seen by a medical provider, you will be either: o Tested for (COVID-19) and discharged home on quarantine except to seek medical care if symptoms worsen, and asked to  - Stay home and avoid contact with others until you get your results (4-5 days)  - Avoid travel on public transportation if possible (such as bus, train, or airplane) or o Sent to the Emergency Department by EMS for evaluation, COVID-19 testing, and possible  admission depending on your condition and test results.  What to do if you are LOW RISK for COVID-19?  Reduce your risk of any infection by using the same precautions used for avoiding the common cold or flu:  . Wash your hands often with soap and warm water for at least 20 seconds.  If soap and water are not readily available, use an alcohol-based hand sanitizer with at least 60% alcohol.  . If coughing or sneezing, cover your mouth and nose by coughing or sneezing into the elbow areas of your shirt or coat, into a tissue or into your sleeve (not your hands). . Avoid shaking hands with others and consider head nods or verbal greetings only. . Avoid touching your eyes, nose, or mouth with unwashed hands.  . Avoid close contact with people who are sick. . Avoid places or events with large numbers of people in one location, like concerts or sporting events. . Carefully consider travel plans you have or are making. . If you are planning any travel outside or inside the [place], visit the CDC's Travelers' Health webpage for the latest health notices. . If you have some symptoms but not all symptoms, continue to monitor at home and seek medical attention if your symptoms worsen. . If you are having a medical emergency, call 911.   ADDITIONAL HEALTHCARE OPTIONS FOR PATIENTS  [facility] / e-Visit: [email or web address]         [facility]: [phone]  [place]   [facility]: [phone]                   [facility]: [phone]   

## 2020-03-26 ENCOUNTER — Ambulatory Visit (HOSPITAL_COMMUNITY): Payer: Medicare Other | Admitting: Hematology

## 2020-03-28 ENCOUNTER — Other Ambulatory Visit: Payer: Self-pay

## 2020-03-28 ENCOUNTER — Inpatient Hospital Stay (HOSPITAL_COMMUNITY): Payer: Medicare Other

## 2020-03-28 DIAGNOSIS — I10 Essential (primary) hypertension: Secondary | ICD-10-CM | POA: Diagnosis not present

## 2020-03-28 DIAGNOSIS — Z85818 Personal history of malignant neoplasm of other sites of lip, oral cavity, and pharynx: Secondary | ICD-10-CM | POA: Diagnosis not present

## 2020-03-28 DIAGNOSIS — C049 Malignant neoplasm of floor of mouth, unspecified: Secondary | ICD-10-CM

## 2020-03-28 LAB — COMPREHENSIVE METABOLIC PANEL
ALT: 14 U/L (ref 0–44)
AST: 17 U/L (ref 15–41)
Albumin: 4.3 g/dL (ref 3.5–5.0)
Alkaline Phosphatase: 77 U/L (ref 38–126)
Anion gap: 7 (ref 5–15)
BUN: 12 mg/dL (ref 8–23)
CO2: 27 mmol/L (ref 22–32)
Calcium: 9.7 mg/dL (ref 8.9–10.3)
Chloride: 96 mmol/L — ABNORMAL LOW (ref 98–111)
Creatinine, Ser: 0.58 mg/dL — ABNORMAL LOW (ref 0.61–1.24)
GFR calc Af Amer: >60 mL/min (ref >60)
GFR calc non Af Amer: >60 mL/min (ref >60)
Glucose, Bld: 111 mg/dL — ABNORMAL HIGH (ref 70–99)
Potassium: 4.6 mmol/L (ref 3.5–5.1)
Sodium: 130 mmol/L — ABNORMAL LOW (ref 135–145)
Total Bilirubin: 0.5 mg/dL (ref 0.3–1.2)
Total Protein: 7 g/dL (ref 6.5–8.1)

## 2020-03-28 LAB — CBC WITH DIFFERENTIAL/PLATELET
Abs Immature Granulocytes: 0.04 10*3/uL (ref 0.00–0.07)
Basophils Absolute: 0 10*3/uL (ref 0.0–0.1)
Basophils Relative: 0 %
Eosinophils Absolute: 0.2 10*3/uL (ref 0.0–0.5)
Eosinophils Relative: 3 %
HCT: 37.6 % — ABNORMAL LOW (ref 39.0–52.0)
Hemoglobin: 12.4 g/dL — ABNORMAL LOW (ref 13.0–17.0)
Immature Granulocytes: 1 %
Lymphocytes Relative: 8 %
Lymphs Abs: 0.6 10*3/uL — ABNORMAL LOW (ref 0.7–4.0)
MCH: 32.1 pg (ref 26.0–34.0)
MCHC: 33 g/dL (ref 30.0–36.0)
MCV: 97.4 fL (ref 80.0–100.0)
Monocytes Absolute: 0.7 10*3/uL (ref 0.1–1.0)
Monocytes Relative: 9 %
Neutro Abs: 6.4 10*3/uL (ref 1.7–7.7)
Neutrophils Relative %: 79 %
Platelets: 239 10*3/uL (ref 150–400)
RBC: 3.86 MIL/uL — ABNORMAL LOW (ref 4.22–5.81)
RDW: 14.4 % (ref 11.5–15.5)
WBC: 7.9 10*3/uL (ref 4.0–10.5)
nRBC: 0 % (ref 0.0–0.2)

## 2020-03-28 LAB — TSH: TSH: 1.29 u[IU]/mL (ref 0.350–4.500)

## 2020-04-02 ENCOUNTER — Encounter (HOSPITAL_COMMUNITY)
Admission: RE | Admit: 2020-04-02 | Discharge: 2020-04-02 | Disposition: A | Discharge status: Home / Self Care | Payer: Medicare Other | Source: Ambulatory Visit | Attending: Hematology | Admitting: Hematology

## 2020-04-02 ENCOUNTER — Other Ambulatory Visit: Payer: Self-pay

## 2020-04-02 DIAGNOSIS — J320 Chronic maxillary sinusitis: Secondary | ICD-10-CM | POA: Diagnosis not present

## 2020-04-02 DIAGNOSIS — C049 Malignant neoplasm of floor of mouth, unspecified: Secondary | ICD-10-CM | POA: Diagnosis not present

## 2020-04-02 DIAGNOSIS — J439 Emphysema, unspecified: Secondary | ICD-10-CM | POA: Insufficient documentation

## 2020-04-02 DIAGNOSIS — R918 Other nonspecific abnormal finding of lung field: Secondary | ICD-10-CM | POA: Diagnosis not present

## 2020-04-02 DIAGNOSIS — C0689 Malignant neoplasm of overlapping sites of other parts of mouth: Secondary | ICD-10-CM | POA: Insufficient documentation

## 2020-04-02 DIAGNOSIS — I700 Atherosclerosis of aorta: Secondary | ICD-10-CM | POA: Insufficient documentation

## 2020-04-02 MED ORDER — FLUDEOXYGLUCOSE F - 18 (FDG) INJECTION
10.5100 | Freq: Once | INTRAVENOUS | Status: AC | PRN
  Administered 2020-04-02: 10.51 via INTRAVENOUS

## 2020-04-03 ENCOUNTER — Ambulatory Visit
Admission: RE | Admit: 2020-04-03 | Discharge: 2020-04-03 | Disposition: A | Discharge status: Home / Self Care | Payer: Medicare Other | Source: Ambulatory Visit | Attending: Radiation Oncology | Admitting: Radiation Oncology

## 2020-04-03 ENCOUNTER — Other Ambulatory Visit: Payer: Self-pay

## 2020-04-03 ENCOUNTER — Encounter: Payer: Self-pay | Admitting: Radiation Oncology

## 2020-04-03 VITALS — BP 89/60 | HR 84 | Temp 97.8°F | Resp 18 | Ht 74.0 in | Wt 144.0 lb

## 2020-04-03 DIAGNOSIS — Z923 Personal history of irradiation: Secondary | ICD-10-CM | POA: Diagnosis not present

## 2020-04-03 DIAGNOSIS — Z7984 Long term (current) use of oral hypoglycemic drugs: Secondary | ICD-10-CM | POA: Diagnosis not present

## 2020-04-03 DIAGNOSIS — I700 Atherosclerosis of aorta: Secondary | ICD-10-CM | POA: Diagnosis not present

## 2020-04-03 DIAGNOSIS — R911 Solitary pulmonary nodule: Secondary | ICD-10-CM | POA: Diagnosis not present

## 2020-04-03 DIAGNOSIS — Z79899 Other long term (current) drug therapy: Secondary | ICD-10-CM | POA: Insufficient documentation

## 2020-04-03 DIAGNOSIS — C3412 Malignant neoplasm of upper lobe, left bronchus or lung: Secondary | ICD-10-CM | POA: Diagnosis not present

## 2020-04-03 DIAGNOSIS — J320 Chronic maxillary sinusitis: Secondary | ICD-10-CM | POA: Insufficient documentation

## 2020-04-03 DIAGNOSIS — D3502 Benign neoplasm of left adrenal gland: Secondary | ICD-10-CM | POA: Diagnosis not present

## 2020-04-03 DIAGNOSIS — D3501 Benign neoplasm of right adrenal gland: Secondary | ICD-10-CM | POA: Insufficient documentation

## 2020-04-03 DIAGNOSIS — J439 Emphysema, unspecified: Secondary | ICD-10-CM | POA: Insufficient documentation

## 2020-04-03 DIAGNOSIS — I2510 Atherosclerotic heart disease of native coronary artery without angina pectoris: Secondary | ICD-10-CM | POA: Insufficient documentation

## 2020-04-03 NOTE — Progress Notes (Signed)
Patient in for follow up after his PET scan. Complains of pain to jaw and tongue from previous cancer. His BP is low and he is still on BP meds advised him to stop and call his PCP.

## 2020-04-03 NOTE — Progress Notes (Signed)
Radiation Oncology         [phone] ________________________________  Name: Lance [name] MRN: [id number]  Date: 04/03/2020  DOB: 1951/11/23  Follow-Up Visit Note  CC: [name], MD  Doreatha Massed, MD    ICD-10-CM   1. Primary cancer of left upper lobe of lung (HCC)  C34.12 CT Chest Wo Contrast    Diagnosis: Non-Small Cell Carcinoma of LULclinical stage T1c, No, M0  Interval Since Last Radiation: Four months, three weeks, and three days.  Radiation Treatment Dates: 11/01/2019 through 11/10/2019 Site Technique Total Dose (Gy) Dose per Fx (Gy) Completed Fx Beam Energies  Thorax: Lung_Lt IMRT 54/54 18 3/3 6XFFF    Narrative:  The patient returns today for follow-up to discuss recent PET scan results. PET scan on 04/02/2020 showed that the more recently developed left upper lobe pulmonary nodule measured 0.7 cm and was moderately suspicious for malignancy given the relatively low metabolic activity. The two dominant areas of irregular and indistinctly marginated density more caudad in th left lower lobe both demonstrated low-grade metabolic activity and may represented treatment-related pneumonitis and/or residual low-grade malignancy. There were no compelling findings of recurrent malignancy along the mandible/tongue, although continued surveillance was suggested.  On review of systems, he reports jaw and tongue pain from previous cancer. He denies new issues since his last follow-up.  Denies any significant cough or breathing issues.  ALLERGIES:  has No Known Allergies.  Meds: Current Outpatient Medications  Medication Sig Dispense Refill  . albuterol (PROVENTIL HFA;VENTOLIN HFA) 108 (90 Base) MCG/ACT inhaler Inhale 1 puff into the lungs every 6 (six) hours as needed for wheezing or shortness of breath.    . baclofen (LIORESAL) 10 MG tablet Take 10 mg by mouth at bedtime.     . canagliflozin (INVOKANA) 100 MG TABS tablet Take 100 mg by mouth daily before breakfast.      . colchicine 0.6 MG tablet Take 0.6 mg by mouth 2 (two) times daily.     . DULoxetine (CYMBALTA) 30 MG capsule Take 30 mg by mouth at bedtime.     . fenofibrate 160 MG tablet Take 160 mg by mouth daily.    Marland Kitchen gabapentin (NEURONTIN) 800 MG tablet Take 800 mg by mouth 3 (three) times daily.    Marland Kitchen lisinopril (PRINIVIL,ZESTRIL) 10 MG tablet Take 10 mg by mouth daily.    . metFORMIN (GLUCOPHAGE) 1000 MG tablet Take 1,000 mg by mouth 2 (two) times daily with a meal.    . morphine (ROXANOL) 20 MG/ML concentrated solution Take 0.5 mLs (10 mg total) by mouth every 8 (eight) hours as needed for severe pain. 30 mL 0  . naloxone (NARCAN) 2 MG/2ML injection Place 1 mg into the nose as needed (for opiod overdose).     . Nutritional Supplements (FEEDING SUPPLEMENT, GLUCERNA 1.5 CAL,) LIQD Give 2 cartons at 8am, noon and 4pm and 1 carton at 8pm via feeding tube (total 7 cartons daily).  Flush with water before and water after. 1659 mL 2  . omeprazole (PRILOSEC) 20 MG capsule Take 20 mg by mouth daily.    Marland Kitchen oxyCODONE (ROXICODONE) 15 MG immediate release tablet Take 15 mg by mouth every 4 (four) hours as needed for pain.    Marland Kitchen oxyCODONE ER (XTAMPZA ER) 13.5 MG C12A TAKE 1 CAPSULE BY MOUTH EVERY TWELVE HOURS IF TOLERATED    . pantoprazole (PROTONIX) 40 MG tablet Take 40 mg by mouth daily.     . pravastatin (PRAVACHOL) 10 MG  tablet Take 10 mg by mouth daily with supper.     . diphenhydramine-acetaminophen (TYLENOL PM) 25-500 MG TABS tablet Take 1 tablet by mouth at bedtime as needed.    . lidocaine (XYLOCAINE) 2 % solution 10 mL by Mouth route every four (4) hours as needed. Gargle gently and swallow    . lidocaine-prilocaine (EMLA) cream Apply a small amount to port a cath site and cover with plastic wrap one hour prior to chemotherapy appointments (Patient not taking: Reported on 12/22/2019) 30 g 3  . nystatin (MYCOSTATIN) 100000 UNIT/ML suspension Take 5 mLs (500,000 Units total) by mouth 4 (four) times  daily. Swish for 5 minutes. (Patient not taking: Reported on 08/22/2019) 60 mL 0   No current facility-administered medications for this encounter.    Physical Findings: The patient is in no acute distress. Patient is alert and oriented.  height is 6\' 2"  (1.88 m) and weight is 144 lb (65.3 kg). His temporal temperature is 97.8 F (36.6 C). His blood pressure is 89/60 (abnormal) and his pulse is 84. His respiration is 18 and oxygen saturation is 98%.  No significant changes. Lungs are clear to auscultation bilaterally. Heart has regular rate and rhythm. No palpable cervical, supraclavicular, or axillary adenopathy. Abdomen soft, non-tender, normal bowel sounds. Surgical changes noted in the neck region.  No obvious signs of recurrence  Lab Findings: Lab Results  Component Value Date   WBC 7.9 03/28/2020   HGB 12.4 (L) 03/28/2020   HCT 37.6 (L) 03/28/2020   MCV 97.4 03/28/2020   PLT 239 03/28/2020    Radiographic Findings: CT Chest Wo Contrast  Result Date: 03/19/2020 CLINICAL DATA:  Non-small-cell lung cancer. History of squamous cell carcinoma floor of mouth. Status post chemotherapy and radiation therapy. EXAM: CT CHEST WITHOUT CONTRAST TECHNIQUE: Multidetector CT imaging of the chest was performed following the standard protocol without IV contrast. COMPARISON:  09/19/2019 PET.  Most recent chest CT 05/03/2019. FINDINGS: Cardiovascular: Right Port-A-Cath tip at low SVC. Aortic atherosclerosis. Tortuous thoracic aorta. Normal heart size, without pericardial effusion. Multivessel coronary artery atherosclerosis. Mediastinum/Nodes: No mediastinal or definite hilar adenopathy, given limitations of unenhanced CT. A prevascular node is similar at 5 mm and not pathologic by size criteria. Lungs/Pleura: No pleural fluid. Moderate centrilobular emphysema. Scattered small areas of ground-glass throughout the upper lobes and left lower lobe are nonspecific and may be smoking related.  Peribronchovascular right lower lobe ground-glass and micronodularity, including on 147/4 is new since the prior CT. Mucous plugging within subtending bronchi, including on 137/4. A 4 mm right upper lobe pulmonary nodule is not readily apparent on 40/4 on the prior. A new rounded left upper lobe 8 mm nodule on 45/4. 2 mm more inferior left upper lobe pulmonary nodule on 57/4. The previously described dominant cavitary left upper lobe lesion is decreased in size, with resolution of previously described cavitation. Example 1.9 x 1.1 cm today versus 2.8 x 1.8 cm on the prior exam. More cephalad and anterior nodule measures 8 x 9 mm on 73/4 versus 9 x 17 mm on the prior exam. Radiation fiducials are seen just medial to the smaller nodule. Airspace and ground-glass within the more peripheral left upper lobe, including on 71/4, are new and presumably radiation induced. Upper Abdomen: Normal imaged portions of the liver, spleen, stomach, kidneys. Adrenal thickening and nodularity is similar, low-density, and likely due to a combination of adenomas and hyperplasia. Musculoskeletal: Cervical spine fixation. Mid and lower thoracic spondylosis. IMPRESSION: 1. Response to therapy of  left upper lobe lung lesions. 2. New more cephalad well-circumscribed left upper lobe pulmonary nodule of 8 mm, suspicious for metastatic disease from 1 of patient's primaries versus less likely a new primary. A 4 mm right upper lobe pulmonary nodule is likely new but nonspecific. 3. New right lower lobe mucous plugging, peribronchovascular ground-glass and micronodularity is mild and suspicious for interval aspiration or infection. 4. No thoracic adenopathy. 5. Aortic atherosclerosis (ICD10-I70.0), coronary artery atherosclerosis and emphysema (ICD10-J43.9). Electronically Signed   By: Jeronimo Greaves M.D.   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 Prior surgical removal. Chemotherapy in February 2021 with radiation therapy. EXAM: NUCLEAR MEDICINE PET SKULL BASE TO THIGH TECHNIQUE: 10.5 mCi F-18 FDG was injected intravenously. Full-ring PET imaging was performed from the skull vertex to thigh after the radiotracer. CT data was obtained and used for attenuation correction and anatomic localization. Fasting blood glucose: 96 mg/dl COMPARISON:  PET-CT 16/09/9603 and chest CT from 03/19/2020 FINDINGS: Mediastinal blood pool activity: SUV max 1.9 Liver activity: SUV max 2.2 HEAD/NECK: Physiologic muscular activity on the left. Low-grade activity along the resection site with maximum SUV up to 5.2 along the anterior tongue margin, previously 3.5 in this vicinity, but without masslike appearance. No hypermetabolic adenopathy in the neck. Incidental CT findings: Partial resection of the left mandible and left anterior tongue with continued postoperative findings in this vicinity. Left pthisis bulbi. Chronic left maxillary sinusitis. Mandibular plate noted. Bilateral common carotid atherosclerotic calcification. CHEST: 0.7 cm left upper lobe nodule on image 148/3, maximum standard uptake value 2.1. The left upper lobe 2.0 by 1.5 cm lesion on image 175/3 has a maximum SUV of 2.4, previously 2.3. Posteriorly in the left upper lobe, an indistinctly marginated density in this vicinity measures about 1.5 by 1.0 cm on image 168/3 and has a maximum SUV of 3.6, and was not readily apparent on prior PET-CT. Subtle subpleural ground-glass density in the lingula measuring 1.2 by 0.6 cm on image 215/3 has a maximum SUV of 2.0. There is some mild mucous plugging and ground-glass density in the right lower lobe which is not hypermetabolic. Physiologic activity in the esophagus. Incidental CT findings: Right Port-A-Cath tip: SVC. Coronary, aortic arch, and branch vessel atherosclerotic vascular disease. Centrilobular emphysema.  ABDOMEN/PELVIS: Physiologic activity in the stomach and bowel. Photopenic cyst in the right kidney. Incidental CT findings: Percutaneous gastrostomy tube. Aortoiliac atherosclerotic vascular disease. Bilateral adrenal adenomas. SKELETON: No significant abnormal hypermetabolic activity in this region. Incidental CT findings: Degenerative sternoclavicular arthropathy. Cervical plate and screw fixator. No significant abnormal bony activity along the mandibular resection site. Chronic right pars defect at L5 with grade 1 anterolisthesis of L5 on S1. IMPRESSION: 1. The more recently developed left upper lobe pulmonary nodule currently measures 0.7 cm in diameter and has maximum standard uptake value of 2.1. This is of moderate suspicion for malignancy given the relatively low metabolic activity. 2. The two dominant areas of irregular and indistinctly marginated density more caudad in the left lower lobe both demonstrate low-grade metabolic activity and may represent treatment related radiation pneumonitis and/or residual low-grade malignancy. 3. No compelling findings of recurrent malignancy along the mandible/tongue although continued surveillance is suggested. Other imaging findings of potential clinical significance: Aortic Atherosclerosis (ICD10-I70.0) and Emphysema (ICD10-J43.9). Coronary atherosclerosis. Chronic right pars defect at L5. Bilateral adrenal adenomas. Chronic left maxillary sinusitis. Electronically  Signed   By: Gaylyn Rong M.D.   On: 04/03/2020 09:51    Impression: Non-Small Cell Carcinoma of LULclinical stage T1c, No, M0  The area treated with stereotactic body radiation therapy has improved based on his PET scan imaging.  The new nodule in the left upper lung area shows minimal activity, possibly malignancy.  Given the minimal uptake on the PET scan I would recommend repeating the chest CT scan in 3 months for further evaluation.  If this new area continues to enlarge then we may need  to consider stereotactic body radiation therapy for this new area.  Plan: The patient is scheduled to see Dr. Ellin Saba on 04/05/2020. Follow-up with radiation oncology in 3 months after upcoming chest CT scan.  ____________________________________   Billie Lade, PhD, MD  This document serves as a record of services personally performed by Antony Blackbird, MD. It was created on his behalf by Nikki Dom, a trained medical scribe. The creation of this record is based on the scribe's personal observations and the provider's statements to them. This document has been checked and approved by the attending provider.

## 2020-04-03 NOTE — Patient Instructions (Signed)
Coronavirus (COVID-19) Are you at risk?  Are you at risk for the Coronavirus (COVID-19)?  To be considered HIGH RISK for Coronavirus (COVID-19), you have to meet the following criteria:  . Traveled to [place], [place], [place], [place] or [place]; or in the [place] to [place], [place], [place], or [place]; and have fever, cough, and shortness of breath within the last 2 weeks of travel OR . Been in close contact with a person diagnosed with COVID-19 within the last 2 weeks and have fever, cough, and shortness of breath . IF YOU DO NOT MEET THESE CRITERIA, YOU ARE CONSIDERED LOW RISK FOR COVID-19.  What to do if you are HIGH RISK for COVID-19?  . If you are having a medical emergency, call 911. . Seek medical care right away. Before you go to a doctor's office, urgent care or emergency department, call ahead and tell them about your recent travel, contact with someone diagnosed with COVID-19, and your symptoms. You should receive instructions from your physician's office regarding next steps of care.  . When you arrive at healthcare provider, tell the healthcare staff immediately you have returned from visiting [place], [place], [place], [place] or [place]; or traveled in the [place] to [place], [place], [place], or [place]; in the last two weeks or you have been in close contact with a person diagnosed with COVID-19 in the last 2 weeks.   . Tell the health care staff about your symptoms: fever, cough and shortness of breath. . After you have been seen by a medical provider, you will be either: o Tested for (COVID-19) and discharged home on quarantine except to seek medical care if symptoms worsen, and asked to  - Stay home and avoid contact with others until you get your results (4-5 days)  - Avoid travel on public transportation if possible (such as bus, train, or airplane) or o Sent to the Emergency Department by EMS for evaluation, COVID-19 testing, and possible  admission depending on your condition and test results.  What to do if you are LOW RISK for COVID-19?  Reduce your risk of any infection by using the same precautions used for avoiding the common cold or flu:  . Wash your hands often with soap and warm water for at least 20 seconds.  If soap and water are not readily available, use an alcohol-based hand sanitizer with at least 60% alcohol.  . If coughing or sneezing, cover your mouth and nose by coughing or sneezing into the elbow areas of your shirt or coat, into a tissue or into your sleeve (not your hands). . Avoid shaking hands with others and consider head nods or verbal greetings only. . Avoid touching your eyes, nose, or mouth with unwashed hands.  . Avoid close contact with people who are sick. . Avoid places or events with large numbers of people in one location, like concerts or sporting events. . Carefully consider travel plans you have or are making. . If you are planning any travel outside or inside the [place], visit the CDC's Travelers' Health webpage for the latest health notices. . If you have some symptoms but not all symptoms, continue to monitor at home and seek medical attention if your symptoms worsen. . If you are having a medical emergency, call 911.   ADDITIONAL HEALTHCARE OPTIONS FOR PATIENTS  [facility] / e-Visit: [email or web address]         [facility]: [phone]  [facility]: [phone]                   [facility]: [phone]   

## 2020-04-04 DIAGNOSIS — M1990 Unspecified osteoarthritis, unspecified site: Secondary | ICD-10-CM | POA: Diagnosis not present

## 2020-04-04 DIAGNOSIS — M5136 Other intervertebral disc degeneration, lumbar region: Secondary | ICD-10-CM | POA: Diagnosis not present

## 2020-04-04 DIAGNOSIS — M5030 Other cervical disc degeneration, unspecified cervical region: Secondary | ICD-10-CM | POA: Diagnosis not present

## 2020-04-04 DIAGNOSIS — G894 Chronic pain syndrome: Secondary | ICD-10-CM | POA: Diagnosis not present

## 2020-04-05 ENCOUNTER — Encounter (HOSPITAL_COMMUNITY): Payer: Self-pay | Admitting: Hematology

## 2020-04-05 ENCOUNTER — Inpatient Hospital Stay (HOSPITAL_BASED_OUTPATIENT_CLINIC_OR_DEPARTMENT_OTHER): Payer: Medicare Other | Admitting: Hematology

## 2020-04-05 ENCOUNTER — Other Ambulatory Visit: Payer: Self-pay

## 2020-04-05 VITALS — BP 106/73 | HR 85 | Temp 97.3°F | Resp 17 | Wt 144.1 lb

## 2020-04-05 DIAGNOSIS — R1310 Dysphagia, unspecified: Secondary | ICD-10-CM

## 2020-04-05 DIAGNOSIS — Z85818 Personal history of malignant neoplasm of other sites of lip, oral cavity, and pharynx: Secondary | ICD-10-CM | POA: Diagnosis not present

## 2020-04-05 DIAGNOSIS — C049 Malignant neoplasm of floor of mouth, unspecified: Secondary | ICD-10-CM | POA: Diagnosis not present

## 2020-04-05 DIAGNOSIS — I10 Essential (primary) hypertension: Secondary | ICD-10-CM | POA: Diagnosis not present

## 2020-04-05 DIAGNOSIS — Z23 Encounter for immunization: Secondary | ICD-10-CM | POA: Diagnosis not present

## 2020-04-05 DIAGNOSIS — C109 Malignant neoplasm of oropharynx, unspecified: Secondary | ICD-10-CM
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
## 2020-04-05 NOTE — Patient Instructions (Addendum)
[place] at [facility] Discharge Instructions  You were seen today by Dr. Delton Coombes. He went over your recent lab and scan results. He will refer you to speech pathology to evaluate your swallowing. He will see you back in for labs and follow up.   Thank you for choosing [place] at [facility] to provide your oncology and hematology care.  To afford each patient quality time with our provider, please arrive at least 15 minutes before your scheduled appointment time.   If you have a lab appointment with the [facility] please come in thru the  Main Entrance and check in at the main information desk  You need to re-schedule your appointment should you arrive 10 or more minutes late.  We strive to give you quality time with our providers, and arriving late affects you and other patients whose appointments are after yours.  Also, if you no show three or more times for appointments you may be dismissed from the clinic at the providers discretion.     Again, thank you for choosing [facility].  Our hope is that these requests will decrease the amount of time that you wait before being seen by our physicians.       _____________________________________________________________  Should you have questions after your visit to [facility], please contact our office at [phone] between the hours of 8:00 a.m. and 4:30 p.m.  Voicemails left after 4:00 p.m. will not be returned until the following business day.  For prescription refill requests, have your pharmacy contact our office and allow 72 hours.    Cancer Center Support Programs:   > Cancer Support Group  2nd Tuesday of the month 1pm-2pm, Journey Room

## 2020-04-07 ENCOUNTER — Encounter (HOSPITAL_COMMUNITY): Payer: Self-pay | Admitting: Hematology

## 2020-04-07 NOTE — Assessment & Plan Note (Addendum)
1.  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 -Reports pain in the lower jaw since surgery. -We reviewed PET scan from 04/02/2020.  No finding of recurrence along the mandible/tongue although continued surveillance is required. -Physical examination did not reveal any palpable adenopathy. -I reviewed his labs.  TSH is normal.  We will plan to repeat PET scan in 6 months.  2.  Nutrition: -He has cut back on tube feeds to 5 cans of Osmolite daily. -He reports difficulty swallowing which is new.  Hence he has stopped eating soft foods.  He lost 10 pounds. -I have recommended increasing tube feeds to 6 cans of Osmolite daily.  We will make a referral for swallow evaluation.  3.  Non-small cell lung cancer: -Bronchoscopy and biopsy on 05/16/2019 was consistent with non-small cell lung cancer. -PET scan on 09/19/2019 showed left upper lobe lung lesion with SUV 2.5, received SBRT from 11/01/2019 through 11/10/2019, 54 Gy in 3 fractions. -We reviewed PET scan from 04/02/2020.  Left upper lobe lung nodule currently measures 0.7 cm, SUV 2.1.  2 dominant areas of irregular indistinctly marginated density more caudad in the left lower lobe both demonstrate low-grade metabolic activity and mild present treatment related radiation pneumonitis. -Because of these findings, I have recommended a CT scan of the chest in 3 months.  4.  Hypertension: -Blood pressure is 106/73.  We will continue to hold lisinopril.

## 2020-04-20 ENCOUNTER — Telehealth (HOSPITAL_COMMUNITY): Payer: Self-pay

## 2020-04-20 NOTE — Telephone Encounter (Signed)
Nutrition  Called patient for nutrition follow-up.  No answer.  Left message on voicemail with call back number.   Chart reviewed. Noted weight loss  Lance [name] B. Zenia Resides, [place], [place] Registered Dietitian [phone] (pager)

## 2020-04-27 ENCOUNTER — Telehealth (HOSPITAL_COMMUNITY): Payer: Self-pay

## 2020-04-27 NOTE — Telephone Encounter (Signed)
Nutrition  Called patient for nutrition follow-up.  No answer.  Left message with call back number.  Ferdie Bakken B. Zenia Resides, [place], [place] Registered Dietitian [phone] (pager)

## 2020-05-02 DIAGNOSIS — M5030 Other cervical disc degeneration, unspecified cervical region: Secondary | ICD-10-CM | POA: Diagnosis not present

## 2020-05-02 DIAGNOSIS — G894 Chronic pain syndrome: Secondary | ICD-10-CM | POA: Diagnosis not present

## 2020-05-02 DIAGNOSIS — Z79891 Long term (current) use of opiate analgesic: Secondary | ICD-10-CM | POA: Diagnosis not present

## 2020-05-02 DIAGNOSIS — M5136 Other intervertebral disc degeneration, lumbar region: Secondary | ICD-10-CM | POA: Diagnosis not present

## 2020-05-30 DIAGNOSIS — M5030 Other cervical disc degeneration, unspecified cervical region: Secondary | ICD-10-CM | POA: Diagnosis not present

## 2020-05-30 DIAGNOSIS — G894 Chronic pain syndrome: Secondary | ICD-10-CM | POA: Diagnosis not present

## 2020-05-30 DIAGNOSIS — M109 Gout, unspecified: Secondary | ICD-10-CM | POA: Diagnosis not present

## 2020-05-30 DIAGNOSIS — E11610 Type 2 diabetes mellitus with diabetic neuropathic arthropathy: Secondary | ICD-10-CM | POA: Diagnosis not present

## 2020-05-30 DIAGNOSIS — M4317 Spondylolisthesis, lumbosacral region: Secondary | ICD-10-CM | POA: Diagnosis not present

## 2020-05-30 DIAGNOSIS — M169 Osteoarthritis of hip, unspecified: Secondary | ICD-10-CM | POA: Diagnosis not present

## 2020-05-30 DIAGNOSIS — M5416 Radiculopathy, lumbar region: Secondary | ICD-10-CM | POA: Diagnosis not present

## 2020-05-30 DIAGNOSIS — M48062 Spinal stenosis, lumbar region with neurogenic claudication: Secondary | ICD-10-CM | POA: Diagnosis not present

## 2020-05-30 DIAGNOSIS — M5136 Other intervertebral disc degeneration, lumbar region: Secondary | ICD-10-CM | POA: Diagnosis not present

## 2020-06-27 DIAGNOSIS — M1990 Unspecified osteoarthritis, unspecified site: Secondary | ICD-10-CM | POA: Diagnosis not present

## 2020-06-27 DIAGNOSIS — Z79891 Long term (current) use of opiate analgesic: Secondary | ICD-10-CM | POA: Diagnosis not present

## 2020-06-27 DIAGNOSIS — M5136 Other intervertebral disc degeneration, lumbar region: Secondary | ICD-10-CM | POA: Diagnosis not present

## 2020-06-27 DIAGNOSIS — G894 Chronic pain syndrome: Secondary | ICD-10-CM | POA: Diagnosis not present

## 2020-07-03 ENCOUNTER — Inpatient Hospital Stay (HOSPITAL_COMMUNITY): Payer: Medicare Other | Attending: Hematology

## 2020-07-03 ENCOUNTER — Ambulatory Visit (HOSPITAL_COMMUNITY): Payer: Medicare Other

## 2020-07-04 ENCOUNTER — Telehealth: Payer: Self-pay | Admitting: *Deleted

## 2020-07-04 NOTE — Telephone Encounter (Signed)
Called patient to ask about rescheduling CT and fu, lvm for a return call

## 2020-07-05 ENCOUNTER — Telehealth: Payer: Self-pay | Admitting: *Deleted

## 2020-07-05 ENCOUNTER — Ambulatory Visit: Payer: Medicare Other | Admitting: Radiation Oncology

## 2020-07-05 NOTE — Telephone Encounter (Signed)
Called patient to ask about rescheduling missed CT, lvm for a return call

## 2020-07-10 ENCOUNTER — Ambulatory Visit (HOSPITAL_COMMUNITY): Payer: Medicare Other | Admitting: Hematology

## 2020-07-25 DIAGNOSIS — G894 Chronic pain syndrome: Secondary | ICD-10-CM | POA: Diagnosis not present

## 2020-08-22 DIAGNOSIS — Z79891 Long term (current) use of opiate analgesic: Secondary | ICD-10-CM | POA: Diagnosis not present

## 2020-08-22 DIAGNOSIS — M1990 Unspecified osteoarthritis, unspecified site: Secondary | ICD-10-CM | POA: Diagnosis not present

## 2020-08-22 DIAGNOSIS — M5136 Other intervertebral disc degeneration, lumbar region: Secondary | ICD-10-CM | POA: Diagnosis not present

## 2020-08-22 DIAGNOSIS — G894 Chronic pain syndrome: Secondary | ICD-10-CM | POA: Diagnosis not present

## 2020-09-03 DIAGNOSIS — Z8249 Family history of ischemic heart disease and other diseases of the circulatory system: Secondary | ICD-10-CM | POA: Diagnosis not present

## 2020-09-03 DIAGNOSIS — I10 Essential (primary) hypertension: Secondary | ICD-10-CM | POA: Diagnosis not present

## 2020-09-06 DIAGNOSIS — E119 Type 2 diabetes mellitus without complications: Secondary | ICD-10-CM | POA: Diagnosis not present

## 2020-09-06 DIAGNOSIS — J449 Chronic obstructive pulmonary disease, unspecified: Secondary | ICD-10-CM | POA: Diagnosis not present

## 2020-09-06 DIAGNOSIS — E7800 Pure hypercholesterolemia, unspecified: Secondary | ICD-10-CM | POA: Diagnosis not present

## 2020-09-19 DIAGNOSIS — M4807 Spinal stenosis, lumbosacral region: Secondary | ICD-10-CM | POA: Diagnosis not present

## 2020-09-19 DIAGNOSIS — M47897 Other spondylosis, lumbosacral region: Secondary | ICD-10-CM | POA: Diagnosis not present

## 2020-09-19 DIAGNOSIS — G8929 Other chronic pain: Secondary | ICD-10-CM | POA: Diagnosis not present

## 2020-09-19 DIAGNOSIS — M792 Neuralgia and neuritis, unspecified: Secondary | ICD-10-CM | POA: Diagnosis not present

## 2020-09-19 DIAGNOSIS — M48062 Spinal stenosis, lumbar region with neurogenic claudication: Secondary | ICD-10-CM | POA: Diagnosis not present

## 2020-09-19 DIAGNOSIS — Z79891 Long term (current) use of opiate analgesic: Secondary | ICD-10-CM | POA: Diagnosis not present

## 2020-09-19 DIAGNOSIS — M79609 Pain in unspecified limb: Secondary | ICD-10-CM | POA: Diagnosis not present

## 2020-09-19 DIAGNOSIS — M4726 Other spondylosis with radiculopathy, lumbar region: Secondary | ICD-10-CM | POA: Diagnosis not present

## 2020-09-19 DIAGNOSIS — M1990 Unspecified osteoarthritis, unspecified site: Secondary | ICD-10-CM | POA: Diagnosis not present

## 2020-09-19 DIAGNOSIS — M5136 Other intervertebral disc degeneration, lumbar region: Secondary | ICD-10-CM | POA: Diagnosis not present

## 2020-09-19 DIAGNOSIS — E0843 Diabetes mellitus due to underlying condition with diabetic autonomic (poly)neuropathy: Secondary | ICD-10-CM | POA: Diagnosis not present

## 2020-09-19 DIAGNOSIS — G894 Chronic pain syndrome: Secondary | ICD-10-CM | POA: Diagnosis not present

## 2020-09-27 DIAGNOSIS — Z681 Body mass index (BMI) 19 or less, adult: Secondary | ICD-10-CM | POA: Diagnosis not present

## 2020-09-27 DIAGNOSIS — F17210 Nicotine dependence, cigarettes, uncomplicated: Secondary | ICD-10-CM | POA: Diagnosis not present

## 2020-09-27 DIAGNOSIS — Z299 Encounter for prophylactic measures, unspecified: Secondary | ICD-10-CM | POA: Diagnosis not present

## 2020-09-27 DIAGNOSIS — Z23 Encounter for immunization: Secondary | ICD-10-CM | POA: Diagnosis not present

## 2020-09-27 DIAGNOSIS — E1165 Type 2 diabetes mellitus with hyperglycemia: Secondary | ICD-10-CM | POA: Diagnosis not present

## 2020-09-27 DIAGNOSIS — D692 Other nonthrombocytopenic purpura: Secondary | ICD-10-CM | POA: Diagnosis not present

## 2020-09-27 DIAGNOSIS — Z931 Gastrostomy status: Secondary | ICD-10-CM | POA: Diagnosis not present

## 2020-09-27 DIAGNOSIS — L89151 Pressure ulcer of sacral region, stage 1: Secondary | ICD-10-CM | POA: Diagnosis not present

## 2020-09-27 DIAGNOSIS — C029 Malignant neoplasm of tongue, unspecified: Secondary | ICD-10-CM | POA: Diagnosis not present

## 2020-09-27 DIAGNOSIS — I10 Essential (primary) hypertension: Secondary | ICD-10-CM | POA: Diagnosis not present

## 2020-10-17 DIAGNOSIS — Z79891 Long term (current) use of opiate analgesic: Secondary | ICD-10-CM | POA: Diagnosis not present

## 2020-10-17 DIAGNOSIS — G894 Chronic pain syndrome: Secondary | ICD-10-CM | POA: Diagnosis not present

## 2020-10-17 DIAGNOSIS — M5136 Other intervertebral disc degeneration, lumbar region: Secondary | ICD-10-CM | POA: Diagnosis not present

## 2020-11-06 DIAGNOSIS — E119 Type 2 diabetes mellitus without complications: Secondary | ICD-10-CM | POA: Diagnosis not present

## 2020-11-06 DIAGNOSIS — J449 Chronic obstructive pulmonary disease, unspecified: Secondary | ICD-10-CM | POA: Diagnosis not present

## 2020-11-06 DIAGNOSIS — E7800 Pure hypercholesterolemia, unspecified: Secondary | ICD-10-CM | POA: Diagnosis not present

## 2020-11-08 DIAGNOSIS — Z681 Body mass index (BMI) 19 or less, adult: Secondary | ICD-10-CM | POA: Diagnosis not present

## 2020-11-08 DIAGNOSIS — E1142 Type 2 diabetes mellitus with diabetic polyneuropathy: Secondary | ICD-10-CM | POA: Diagnosis not present

## 2020-11-08 DIAGNOSIS — E119 Type 2 diabetes mellitus without complications: Secondary | ICD-10-CM | POA: Diagnosis not present

## 2020-11-08 DIAGNOSIS — E1165 Type 2 diabetes mellitus with hyperglycemia: Secondary | ICD-10-CM | POA: Diagnosis not present

## 2020-11-08 DIAGNOSIS — I10 Essential (primary) hypertension: Secondary | ICD-10-CM | POA: Diagnosis not present

## 2020-11-08 DIAGNOSIS — Z23 Encounter for immunization: Secondary | ICD-10-CM | POA: Diagnosis not present

## 2020-11-08 DIAGNOSIS — F17210 Nicotine dependence, cigarettes, uncomplicated: Secondary | ICD-10-CM | POA: Diagnosis not present

## 2020-11-08 DIAGNOSIS — M1990 Unspecified osteoarthritis, unspecified site: Secondary | ICD-10-CM | POA: Diagnosis not present

## 2020-11-08 DIAGNOSIS — Z299 Encounter for prophylactic measures, unspecified: Secondary | ICD-10-CM | POA: Diagnosis not present

## 2020-11-14 DIAGNOSIS — M1990 Unspecified osteoarthritis, unspecified site: Secondary | ICD-10-CM | POA: Diagnosis not present

## 2020-11-14 DIAGNOSIS — G894 Chronic pain syndrome: Secondary | ICD-10-CM | POA: Diagnosis not present

## 2020-11-14 DIAGNOSIS — Z79891 Long term (current) use of opiate analgesic: Secondary | ICD-10-CM | POA: Diagnosis not present

## 2020-11-14 DIAGNOSIS — M5136 Other intervertebral disc degeneration, lumbar region: Secondary | ICD-10-CM | POA: Diagnosis not present

## 2020-11-27 IMAGING — CT CT CHEST W/O CM
3 of 8 series · 15 of 36 positions shown, 18 images · non-contrast
Comparison: 09/19/2019 PET.  Most recent chest CT 05/03/2019.

CLINICAL DATA: Non-small-cell lung cancer. History of squamous cell
carcinoma floor of mouth. Status post chemotherapy and radiation
therapy.

EXAM:
CT CHEST WITHOUT CONTRAST
TECHNIQUE: Multidetector CT imaging of the chest was performed following the
standard protocol without IV contrast.

[Series 2: routine chest without · axial · non-contrast · 0.72mm/px · z∈[-197,+93]mm · 9 of 183 slices shown, 12 images]
[im 19/183  mediastinal]
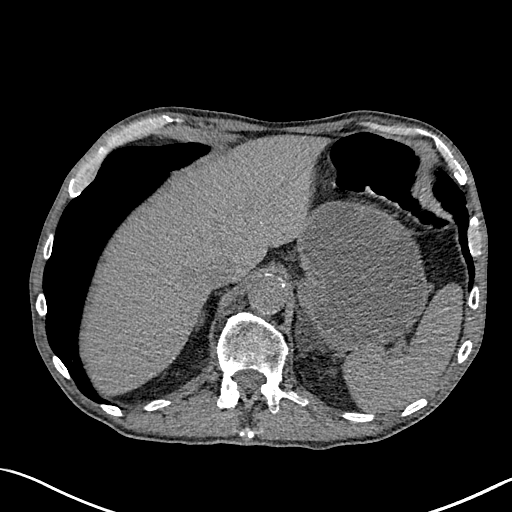
[im 19/183  lung]
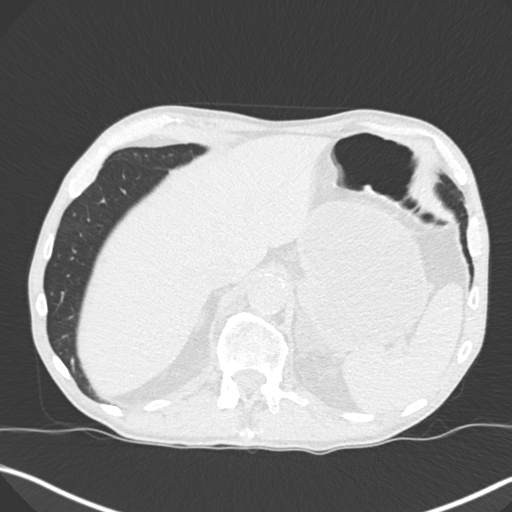
[im 37/183  lung]
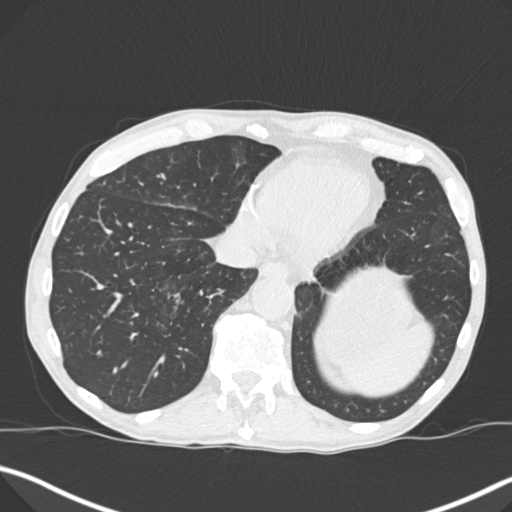
[im 55/183  lung]
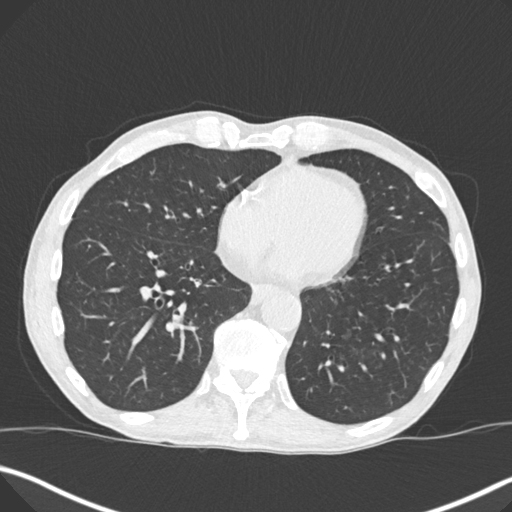
[im 73/183  lung]
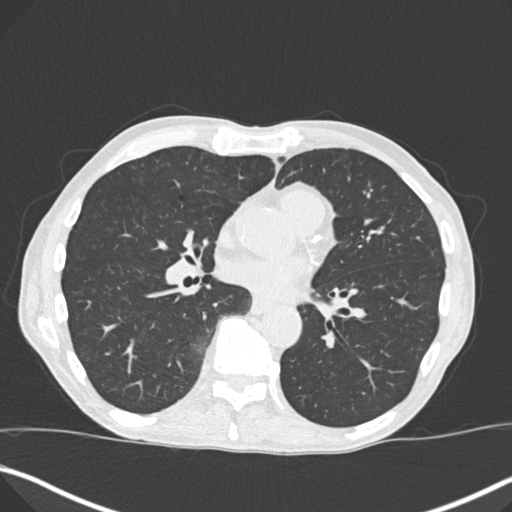
[im 92/183  mediastinal]
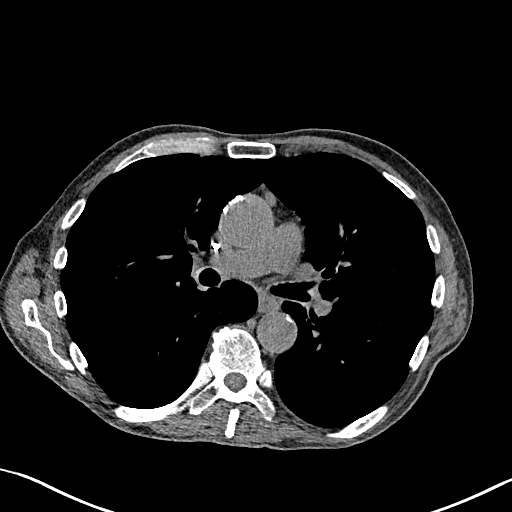
[im 92/183  lung]
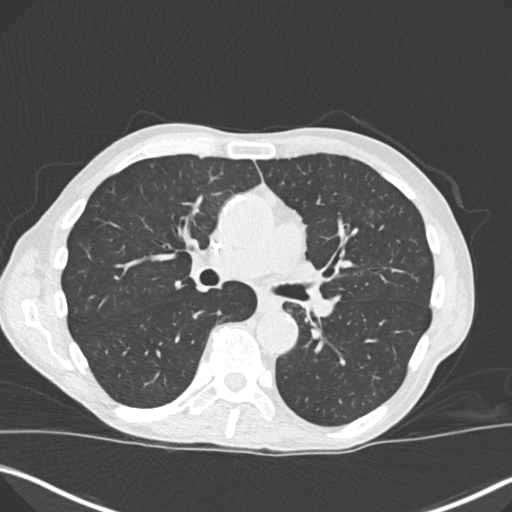
[im 110/183  lung]
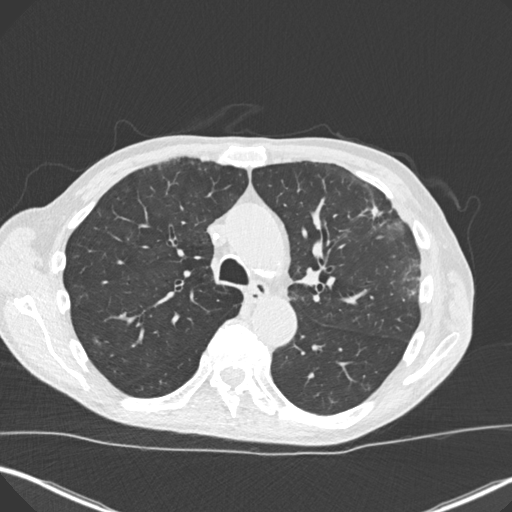
[im 128/183  lung]
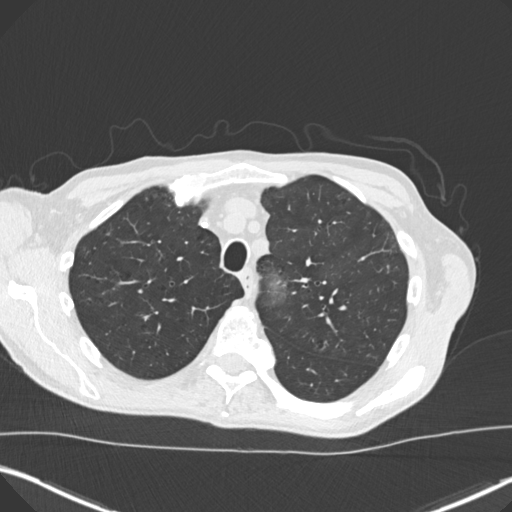
[im 146/183  lung]
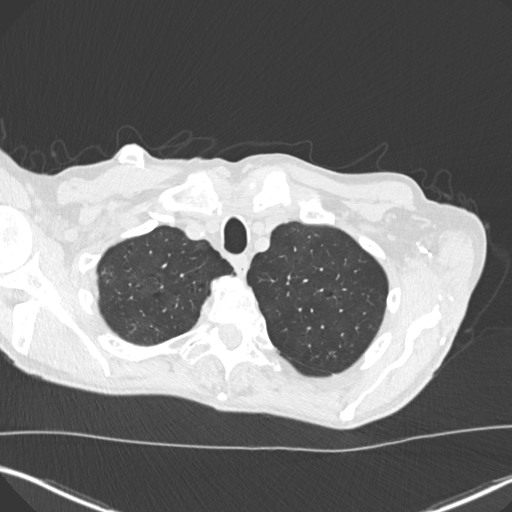
[im 164/183  mediastinal]
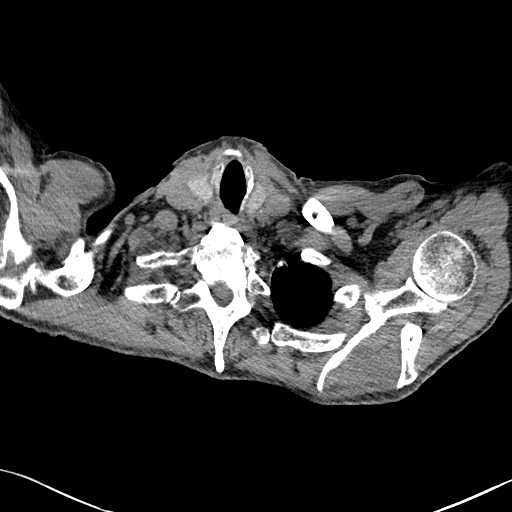
[im 164/183  lung]
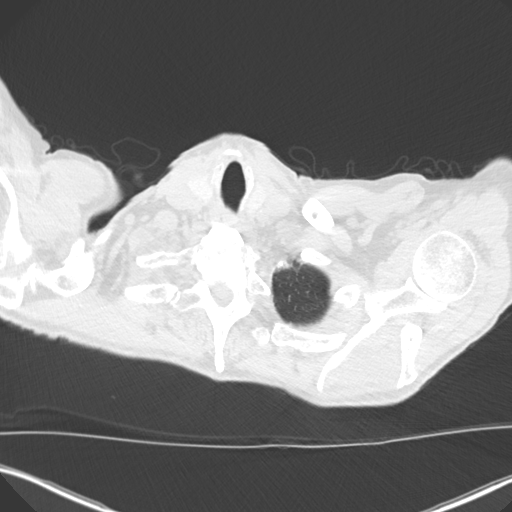

[Series 4: lungs · axial · 0.72mm/px · z∈[-197,-15]mm · 5 of 183 slices shown]
[im 19/183  lung]
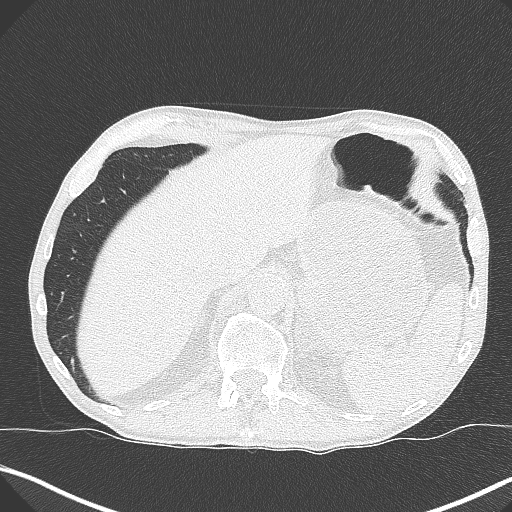
[im 37/183  lung]
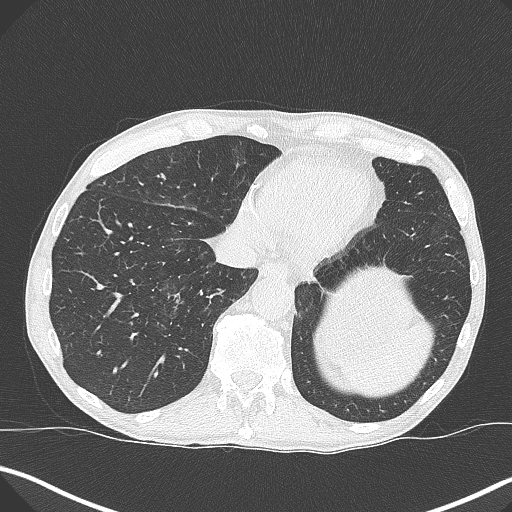
[im 55/183  lung]
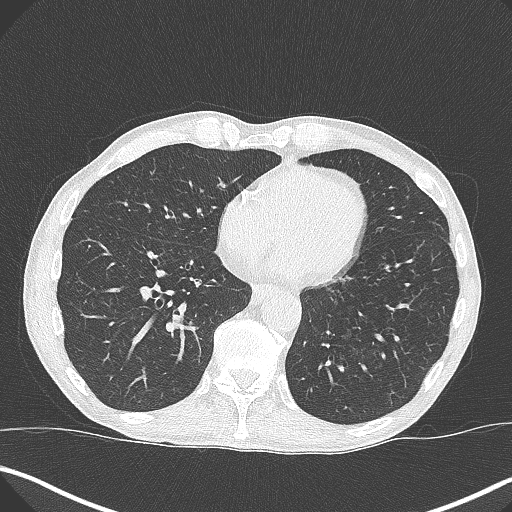
[im 73/183  lung]
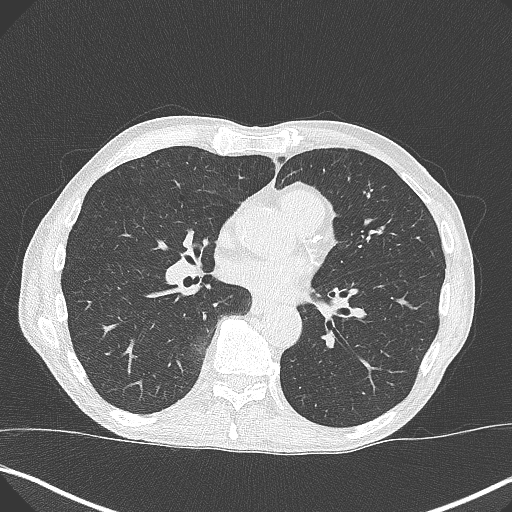
[im 110/183  lung]
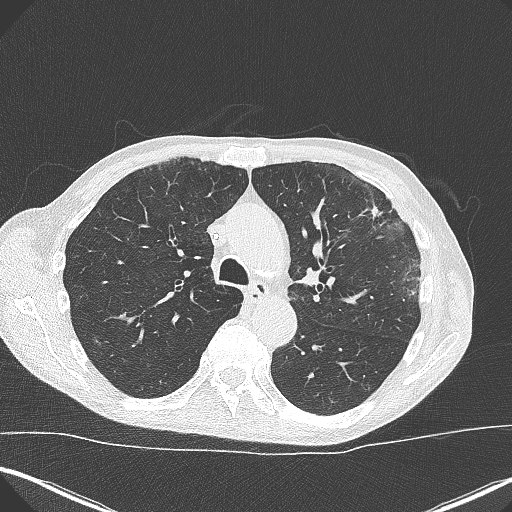

[Series 5: coronal · coronal · 0.67mm/px · 1 of 151 slices shown]
[im 76/151  lung]
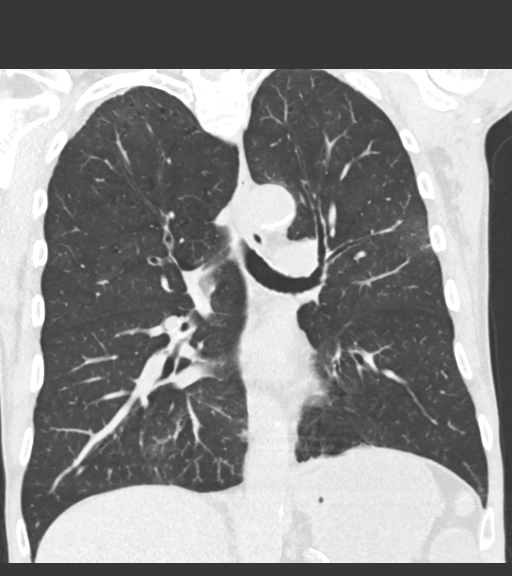

[15 of 36 positions shown; findings below may reference images not displayed]

FINDINGS: Cardiovascular: Right Port-A-Cath tip at low SVC. Aortic
atherosclerosis. Tortuous thoracic aorta. Normal heart size, without
pericardial effusion. Multivessel coronary artery atherosclerosis.

Mediastinum/Nodes: No mediastinal or definite hilar adenopathy,
given limitations of unenhanced CT. A prevascular node is similar at
5 mm and not pathologic by size criteria.

Lungs/Pleura: No pleural fluid.

Moderate centrilobular emphysema.

Scattered small areas of ground-glass throughout the upper lobes and
left lower lobe are nonspecific and may be smoking related.

Peribronchovascular right lower lobe ground-glass and
micronodularity, including on 147/4 is new since the prior CT.
Mucous plugging within subtending bronchi, including on 137/4.

A 4 mm right upper lobe pulmonary nodule is not readily apparent on
40/4 on the prior.

A new rounded left upper lobe 8 mm nodule on 45/4.

2 mm more inferior left upper lobe pulmonary nodule on 57/4.

The previously described dominant cavitary left upper lobe lesion is
decreased in size, with resolution of previously described
cavitation. Example 1.9 x 1.1 cm today versus 2.8 x 1.8 cm on the
prior exam.

More cephalad and anterior nodule measures 8 x 9 mm on 73/4 versus 9
x 17 mm on the prior exam.

Radiation fiducials are seen just medial to the smaller nodule.

Airspace and ground-glass within the more peripheral left upper
lobe, including on 71/4, are new and presumably radiation induced.

Upper Abdomen: Normal imaged portions of the liver, spleen, stomach,
kidneys. Adrenal thickening and nodularity is similar, low-density,
and likely due to a combination of adenomas and hyperplasia.

Musculoskeletal: Cervical spine fixation. Mid and lower thoracic
spondylosis.
IMPRESSION: 1. Response to therapy of left upper lobe lung lesions.
2. New more cephalad well-circumscribed left upper lobe pulmonary
nodule of 8 mm, suspicious for metastatic disease from 1 of
patient's primaries versus less likely a new primary. A 4 mm right
upper lobe pulmonary nodule is likely new but nonspecific.
3. New right lower lobe mucous plugging, peribronchovascular
ground-glass and micronodularity is mild and suspicious for interval
aspiration or infection.
4. No thoracic adenopathy.
5. Aortic atherosclerosis (JUDHZ-YS5.5), coronary artery
atherosclerosis and emphysema (JUDHZ-ALO.Y).

## 2020-12-06 DIAGNOSIS — E7800 Pure hypercholesterolemia, unspecified: Secondary | ICD-10-CM | POA: Diagnosis not present

## 2020-12-06 DIAGNOSIS — E119 Type 2 diabetes mellitus without complications: Secondary | ICD-10-CM | POA: Diagnosis not present

## 2020-12-06 DIAGNOSIS — J449 Chronic obstructive pulmonary disease, unspecified: Secondary | ICD-10-CM | POA: Diagnosis not present

## 2020-12-12 DIAGNOSIS — M5136 Other intervertebral disc degeneration, lumbar region: Secondary | ICD-10-CM | POA: Diagnosis not present

## 2020-12-12 DIAGNOSIS — M1990 Unspecified osteoarthritis, unspecified site: Secondary | ICD-10-CM | POA: Diagnosis not present

## 2020-12-12 DIAGNOSIS — G894 Chronic pain syndrome: Secondary | ICD-10-CM | POA: Diagnosis not present

## 2020-12-12 DIAGNOSIS — Z79891 Long term (current) use of opiate analgesic: Secondary | ICD-10-CM | POA: Diagnosis not present

## 2020-12-14 DIAGNOSIS — Z7189 Other specified counseling: Secondary | ICD-10-CM | POA: Diagnosis not present

## 2020-12-14 DIAGNOSIS — Z299 Encounter for prophylactic measures, unspecified: Secondary | ICD-10-CM | POA: Diagnosis not present

## 2020-12-14 DIAGNOSIS — Z125 Encounter for screening for malignant neoplasm of prostate: Secondary | ICD-10-CM | POA: Diagnosis not present

## 2020-12-14 DIAGNOSIS — Z681 Body mass index (BMI) 19 or less, adult: Secondary | ICD-10-CM | POA: Diagnosis not present

## 2020-12-14 DIAGNOSIS — Z1331 Encounter for screening for depression: Secondary | ICD-10-CM | POA: Diagnosis not present

## 2020-12-14 DIAGNOSIS — R5383 Other fatigue: Secondary | ICD-10-CM | POA: Diagnosis not present

## 2020-12-14 DIAGNOSIS — E7800 Pure hypercholesterolemia, unspecified: Secondary | ICD-10-CM | POA: Diagnosis not present

## 2020-12-14 DIAGNOSIS — Z1339 Encounter for screening examination for other mental health and behavioral disorders: Secondary | ICD-10-CM | POA: Diagnosis not present

## 2020-12-14 DIAGNOSIS — Z0000 Encounter for general adult medical examination without abnormal findings: Secondary | ICD-10-CM | POA: Diagnosis not present

## 2020-12-14 DIAGNOSIS — F17210 Nicotine dependence, cigarettes, uncomplicated: Secondary | ICD-10-CM | POA: Diagnosis not present

## 2020-12-14 DIAGNOSIS — Z79899 Other long term (current) drug therapy: Secondary | ICD-10-CM | POA: Diagnosis not present

## 2020-12-31 DIAGNOSIS — Z681 Body mass index (BMI) 19 or less, adult: Secondary | ICD-10-CM | POA: Diagnosis not present

## 2020-12-31 DIAGNOSIS — E441 Mild protein-calorie malnutrition: Secondary | ICD-10-CM | POA: Diagnosis not present

## 2020-12-31 DIAGNOSIS — E1142 Type 2 diabetes mellitus with diabetic polyneuropathy: Secondary | ICD-10-CM | POA: Diagnosis not present

## 2020-12-31 DIAGNOSIS — E1165 Type 2 diabetes mellitus with hyperglycemia: Secondary | ICD-10-CM | POA: Diagnosis not present

## 2020-12-31 DIAGNOSIS — F17210 Nicotine dependence, cigarettes, uncomplicated: Secondary | ICD-10-CM | POA: Diagnosis not present

## 2020-12-31 DIAGNOSIS — Z299 Encounter for prophylactic measures, unspecified: Secondary | ICD-10-CM | POA: Diagnosis not present

## 2020-12-31 DIAGNOSIS — I10 Essential (primary) hypertension: Secondary | ICD-10-CM | POA: Diagnosis not present

## 2021-01-09 DIAGNOSIS — R5382 Chronic fatigue, unspecified: Secondary | ICD-10-CM | POA: Diagnosis not present

## 2021-01-09 DIAGNOSIS — M1990 Unspecified osteoarthritis, unspecified site: Secondary | ICD-10-CM | POA: Diagnosis not present

## 2021-01-09 DIAGNOSIS — M4807 Spinal stenosis, lumbosacral region: Secondary | ICD-10-CM | POA: Diagnosis not present

## 2021-01-09 DIAGNOSIS — M79609 Pain in unspecified limb: Secondary | ICD-10-CM | POA: Diagnosis not present

## 2021-01-09 DIAGNOSIS — G894 Chronic pain syndrome: Secondary | ICD-10-CM | POA: Diagnosis not present

## 2021-01-09 DIAGNOSIS — G8929 Other chronic pain: Secondary | ICD-10-CM | POA: Diagnosis not present

## 2021-01-09 DIAGNOSIS — M47897 Other spondylosis, lumbosacral region: Secondary | ICD-10-CM | POA: Diagnosis not present

## 2021-01-09 DIAGNOSIS — M4726 Other spondylosis with radiculopathy, lumbar region: Secondary | ICD-10-CM | POA: Diagnosis not present

## 2021-01-09 DIAGNOSIS — M48062 Spinal stenosis, lumbar region with neurogenic claudication: Secondary | ICD-10-CM | POA: Diagnosis not present

## 2021-01-09 DIAGNOSIS — M5136 Other intervertebral disc degeneration, lumbar region: Secondary | ICD-10-CM | POA: Diagnosis not present

## 2021-01-09 DIAGNOSIS — M792 Neuralgia and neuritis, unspecified: Secondary | ICD-10-CM | POA: Diagnosis not present

## 2021-01-09 DIAGNOSIS — Z79891 Long term (current) use of opiate analgesic: Secondary | ICD-10-CM | POA: Diagnosis not present

## 2021-02-06 DIAGNOSIS — G894 Chronic pain syndrome: Secondary | ICD-10-CM | POA: Diagnosis not present

## 2021-03-07 ENCOUNTER — Emergency Department (HOSPITAL_COMMUNITY): Payer: Medicare Other

## 2021-03-07 ENCOUNTER — Other Ambulatory Visit: Payer: Self-pay

## 2021-03-07 ENCOUNTER — Inpatient Hospital Stay (HOSPITAL_COMMUNITY)
Admission: EM | Admit: 2021-03-07 | Discharge: 2021-04-07 | DRG: 146 | Disposition: E | Payer: Medicare Other | Attending: Internal Medicine | Admitting: Internal Medicine

## 2021-03-07 ENCOUNTER — Encounter (HOSPITAL_COMMUNITY): Payer: Self-pay | Admitting: Emergency Medicine

## 2021-03-07 DIAGNOSIS — C7989 Secondary malignant neoplasm of other specified sites: Secondary | ICD-10-CM | POA: Diagnosis present

## 2021-03-07 DIAGNOSIS — R0902 Hypoxemia: Secondary | ICD-10-CM

## 2021-03-07 DIAGNOSIS — Z66 Do not resuscitate: Secondary | ICD-10-CM | POA: Diagnosis not present

## 2021-03-07 DIAGNOSIS — Z9221 Personal history of antineoplastic chemotherapy: Secondary | ICD-10-CM

## 2021-03-07 DIAGNOSIS — R0602 Shortness of breath: Secondary | ICD-10-CM | POA: Diagnosis not present

## 2021-03-07 DIAGNOSIS — G894 Chronic pain syndrome: Secondary | ICD-10-CM | POA: Diagnosis present

## 2021-03-07 DIAGNOSIS — R110 Nausea: Secondary | ICD-10-CM | POA: Diagnosis not present

## 2021-03-07 DIAGNOSIS — J690 Pneumonitis due to inhalation of food and vomit: Secondary | ICD-10-CM | POA: Diagnosis not present

## 2021-03-07 DIAGNOSIS — C3412 Malignant neoplasm of upper lobe, left bronchus or lung: Secondary | ICD-10-CM | POA: Diagnosis not present

## 2021-03-07 DIAGNOSIS — C787 Secondary malignant neoplasm of liver and intrahepatic bile duct: Secondary | ICD-10-CM

## 2021-03-07 DIAGNOSIS — E860 Dehydration: Secondary | ICD-10-CM | POA: Diagnosis not present

## 2021-03-07 DIAGNOSIS — R531 Weakness: Secondary | ICD-10-CM | POA: Diagnosis not present

## 2021-03-07 DIAGNOSIS — Z931 Gastrostomy status: Secondary | ICD-10-CM

## 2021-03-07 DIAGNOSIS — E119 Type 2 diabetes mellitus without complications: Secondary | ICD-10-CM

## 2021-03-07 DIAGNOSIS — I10 Essential (primary) hypertension: Secondary | ICD-10-CM | POA: Diagnosis not present

## 2021-03-07 DIAGNOSIS — R1310 Dysphagia, unspecified: Secondary | ICD-10-CM | POA: Diagnosis present

## 2021-03-07 DIAGNOSIS — E1140 Type 2 diabetes mellitus with diabetic neuropathy, unspecified: Secondary | ICD-10-CM | POA: Diagnosis present

## 2021-03-07 DIAGNOSIS — H5462 Unqualified visual loss, left eye, normal vision right eye: Secondary | ICD-10-CM | POA: Diagnosis present

## 2021-03-07 DIAGNOSIS — C109 Malignant neoplasm of oropharynx, unspecified: Secondary | ICD-10-CM | POA: Diagnosis not present

## 2021-03-07 DIAGNOSIS — R112 Nausea with vomiting, unspecified: Secondary | ICD-10-CM | POA: Diagnosis not present

## 2021-03-07 DIAGNOSIS — F32A Depression, unspecified: Secondary | ICD-10-CM | POA: Diagnosis present

## 2021-03-07 DIAGNOSIS — Z681 Body mass index (BMI) 19 or less, adult: Secondary | ICD-10-CM | POA: Diagnosis not present

## 2021-03-07 DIAGNOSIS — C7951 Secondary malignant neoplasm of bone: Secondary | ICD-10-CM | POA: Diagnosis not present

## 2021-03-07 DIAGNOSIS — C069 Malignant neoplasm of mouth, unspecified: Secondary | ICD-10-CM | POA: Diagnosis present

## 2021-03-07 DIAGNOSIS — Z801 Family history of malignant neoplasm of trachea, bronchus and lung: Secondary | ICD-10-CM

## 2021-03-07 DIAGNOSIS — Z923 Personal history of irradiation: Secondary | ICD-10-CM

## 2021-03-07 DIAGNOSIS — R911 Solitary pulmonary nodule: Secondary | ICD-10-CM | POA: Diagnosis not present

## 2021-03-07 DIAGNOSIS — R64 Cachexia: Secondary | ICD-10-CM | POA: Diagnosis present

## 2021-03-07 DIAGNOSIS — L8990 Pressure ulcer of unspecified site, unspecified stage: Secondary | ICD-10-CM | POA: Insufficient documentation

## 2021-03-07 DIAGNOSIS — Z87891 Personal history of nicotine dependence: Secondary | ICD-10-CM

## 2021-03-07 DIAGNOSIS — R627 Adult failure to thrive: Secondary | ICD-10-CM | POA: Diagnosis not present

## 2021-03-07 DIAGNOSIS — J449 Chronic obstructive pulmonary disease, unspecified: Secondary | ICD-10-CM | POA: Diagnosis present

## 2021-03-07 DIAGNOSIS — Z79891 Long term (current) use of opiate analgesic: Secondary | ICD-10-CM

## 2021-03-07 DIAGNOSIS — Z8249 Family history of ischemic heart disease and other diseases of the circulatory system: Secondary | ICD-10-CM

## 2021-03-07 DIAGNOSIS — I708 Atherosclerosis of other arteries: Secondary | ICD-10-CM | POA: Diagnosis not present

## 2021-03-07 DIAGNOSIS — Z20822 Contact with and (suspected) exposure to covid-19: Secondary | ICD-10-CM | POA: Diagnosis not present

## 2021-03-07 DIAGNOSIS — L89152 Pressure ulcer of sacral region, stage 2: Secondary | ICD-10-CM | POA: Diagnosis present

## 2021-03-07 DIAGNOSIS — Z7984 Long term (current) use of oral hypoglycemic drugs: Secondary | ICD-10-CM

## 2021-03-07 DIAGNOSIS — R54 Age-related physical debility: Secondary | ICD-10-CM | POA: Diagnosis present

## 2021-03-07 DIAGNOSIS — Z825 Family history of asthma and other chronic lower respiratory diseases: Secondary | ICD-10-CM

## 2021-03-07 DIAGNOSIS — Z515 Encounter for palliative care: Secondary | ICD-10-CM

## 2021-03-07 DIAGNOSIS — B370 Candidal stomatitis: Secondary | ICD-10-CM | POA: Diagnosis present

## 2021-03-07 DIAGNOSIS — M109 Gout, unspecified: Secondary | ICD-10-CM | POA: Diagnosis present

## 2021-03-07 DIAGNOSIS — E43 Unspecified severe protein-calorie malnutrition: Secondary | ICD-10-CM | POA: Diagnosis not present

## 2021-03-07 DIAGNOSIS — I2510 Atherosclerotic heart disease of native coronary artery without angina pectoris: Secondary | ICD-10-CM | POA: Diagnosis not present

## 2021-03-07 DIAGNOSIS — Z79899 Other long term (current) drug therapy: Secondary | ICD-10-CM

## 2021-03-07 DIAGNOSIS — I712 Thoracic aortic aneurysm, without rupture: Secondary | ICD-10-CM | POA: Diagnosis not present

## 2021-03-07 DIAGNOSIS — K219 Gastro-esophageal reflux disease without esophagitis: Secondary | ICD-10-CM | POA: Diagnosis present

## 2021-03-07 LAB — COMPREHENSIVE METABOLIC PANEL
ALT: 33 U/L (ref 0–44)
AST: 40 U/L (ref 15–41)
Albumin: 2.6 g/dL — ABNORMAL LOW (ref 3.5–5.0)
Alkaline Phosphatase: 166 U/L — ABNORMAL HIGH (ref 38–126)
Anion gap: 10 (ref 5–15)
BUN: 28 mg/dL — ABNORMAL HIGH (ref 8–23)
CO2: 28 mmol/L (ref 22–32)
Calcium: 9.2 mg/dL (ref 8.9–10.3)
Chloride: 103 mmol/L (ref 98–111)
Creatinine, Ser: 0.57 mg/dL — ABNORMAL LOW (ref 0.61–1.24)
GFR, Estimated: >60 mL/min (ref >60)
Glucose, Bld: 148 mg/dL — ABNORMAL HIGH (ref 70–99)
Potassium: 3.6 mmol/L (ref 3.5–5.1)
Sodium: 141 mmol/L (ref 135–145)
Total Bilirubin: 0.7 mg/dL (ref 0.3–1.2)
Total Protein: 5.7 g/dL — ABNORMAL LOW (ref 6.5–8.1)

## 2021-03-07 LAB — URINALYSIS, ROUTINE W REFLEX MICROSCOPIC
Bacteria, UA: NONE SEEN
Bilirubin Urine: NEGATIVE
Glucose, UA: >=500 mg/dL — AB
Hgb urine dipstick: NEGATIVE
Ketones, ur: 5 mg/dL — AB
Leukocytes,Ua: NEGATIVE
Nitrite: NEGATIVE
Protein, ur: NEGATIVE mg/dL
Specific Gravity, Urine: 1.024 (ref 1.005–1.030)
pH: 6 (ref 5.0–8.0)

## 2021-03-07 LAB — CBC WITH DIFFERENTIAL/PLATELET
Abs Immature Granulocytes: 0.11 10*3/uL — ABNORMAL HIGH (ref 0.00–0.07)
Basophils Absolute: 0 10*3/uL (ref 0.0–0.1)
Basophils Relative: 0 %
Eosinophils Absolute: 0 10*3/uL (ref 0.0–0.5)
Eosinophils Relative: 0 %
HCT: 34.9 % — ABNORMAL LOW (ref 39.0–52.0)
Hemoglobin: 11 g/dL — ABNORMAL LOW (ref 13.0–17.0)
Immature Granulocytes: 1 %
Lymphocytes Relative: 2 %
Lymphs Abs: 0.4 10*3/uL — ABNORMAL LOW (ref 0.7–4.0)
MCH: 29.6 pg (ref 26.0–34.0)
MCHC: 31.5 g/dL (ref 30.0–36.0)
MCV: 94.1 fL (ref 80.0–100.0)
Monocytes Absolute: 0.7 10*3/uL (ref 0.1–1.0)
Monocytes Relative: 4 %
Neutro Abs: 18.7 10*3/uL — ABNORMAL HIGH (ref 1.7–7.7)
Neutrophils Relative %: 93 %
Platelets: 402 10*3/uL — ABNORMAL HIGH (ref 150–400)
RBC: 3.71 MIL/uL — ABNORMAL LOW (ref 4.22–5.81)
RDW: 14.3 % (ref 11.5–15.5)
WBC: 20 10*3/uL — ABNORMAL HIGH (ref 4.0–10.5)
nRBC: 0 % (ref 0.0–0.2)

## 2021-03-07 LAB — RESP PANEL BY RT-PCR (FLU A&B, COVID) ARPGX2
Influenza A by PCR: NEGATIVE
Influenza B by PCR: NEGATIVE
SARS Coronavirus 2 by RT PCR: NEGATIVE

## 2021-03-07 LAB — GLUCOSE, CAPILLARY: Glucose-Capillary: 113 mg/dL — ABNORMAL HIGH (ref 70–99)

## 2021-03-07 MED ORDER — OXYCODONE HCL 5 MG PO TABS
10.0000 mg | ORAL_TABLET | Freq: Four times a day (QID) | ORAL | Status: DC | PRN
  Administered 2021-03-08 – 2021-03-09 (×3): 10 mg via ORAL
  Filled 2021-03-07 (×3): qty 2

## 2021-03-07 MED ORDER — SODIUM CHLORIDE 0.9 % IV BOLUS
500.0000 mL | Freq: Once | INTRAVENOUS | Status: AC
  Administered 2021-03-07: 500 mL via INTRAVENOUS

## 2021-03-07 MED ORDER — ENOXAPARIN SODIUM 30 MG/0.3ML ~~LOC~~ SOLN
30.0000 mg | SUBCUTANEOUS | Status: DC
  Administered 2021-03-08 – 2021-03-09 (×2): 30 mg via SUBCUTANEOUS
  Filled 2021-03-07 (×2): qty 0.3

## 2021-03-07 MED ORDER — OXYCODONE HCL ER 10 MG PO T12A
10.0000 mg | EXTENDED_RELEASE_TABLET | Freq: Two times a day (BID) | ORAL | Status: DC
Start: 2021-03-08 — End: 2021-03-10
  Administered 2021-03-07 – 2021-03-09 (×3): 10 mg via ORAL
  Filled 2021-03-07 (×4): qty 1

## 2021-03-07 MED ORDER — IOHEXOL 300 MG/ML  SOLN
75.0000 mL | Freq: Once | INTRAMUSCULAR | Status: AC | PRN
  Administered 2021-03-07: 75 mL via INTRAVENOUS

## 2021-03-07 MED ORDER — PANTOPRAZOLE SODIUM 40 MG PO TBEC
40.0000 mg | DELAYED_RELEASE_TABLET | Freq: Every day | ORAL | Status: DC
  Administered 2021-03-08 – 2021-03-10 (×3): 40 mg via ORAL
  Filled 2021-03-07 (×4): qty 1

## 2021-03-07 MED ORDER — OXYCODONE ER 9 MG PO C12A: 9.0000 mg | EXTENDED_RELEASE_CAPSULE | Freq: Two times a day (BID) | ORAL | Status: DC

## 2021-03-07 MED ORDER — GABAPENTIN 400 MG PO CAPS
800.0000 mg | ORAL_CAPSULE | Freq: Three times a day (TID) | ORAL | Status: DC
  Administered 2021-03-08 – 2021-03-10 (×7): 800 mg via ORAL
  Filled 2021-03-07 (×7): qty 2

## 2021-03-07 MED ORDER — INSULIN ASPART 100 UNIT/ML ~~LOC~~ SOLN
0.0000 [IU] | Freq: Three times a day (TID) | SUBCUTANEOUS | Status: DC
  Administered 2021-03-11 – 2021-03-14 (×6): 1 [IU] via SUBCUTANEOUS

## 2021-03-07 MED ORDER — POTASSIUM CHLORIDE IN NACL 20-0.9 MEQ/L-% IV SOLN: INTRAVENOUS | Status: DC

## 2021-03-07 MED ORDER — LEVOFLOXACIN IN D5W 500 MG/100ML IV SOLN
500.0000 mg | Freq: Once | INTRAVENOUS | Status: AC
  Administered 2021-03-07: 500 mg via INTRAVENOUS
  Filled 2021-03-07: qty 100

## 2021-03-07 NOTE — ED Notes (Signed)
All clothing was removed and pt placed in a gown.

## 2021-03-07 NOTE — ED Triage Notes (Signed)
Pt to the ED [facility] called out by his sister for Failure to thrive.  Pt's sister reports he has not been eating or drinking. Pt has a feeding tube in place. Patient is a poor historian.  Pt reports using home O2, however he is 89 on RA.

## 2021-03-07 NOTE — ED Provider Notes (Signed)
in ED Medications  sodium chloride 0.9 % bolus 500 mL (0 mLs Intravenous Stopped 02/24/2021 1738)  levofloxacin (LEVAQUIN) IVPB 500 mg (0 mg Intravenous Stopped 02/24/2021 1930)  iohexol (OMNIPAQUE) 300 MG/ML  solution 75 mL (75 mLs Intravenous Contrast Given 03/01/2021 2004)    ED Course  I have reviewed the triage vital signs and the nursing notes.  Pertinent labs & imaging results that were available during my care of the patient were reviewed by me and considered in my medical decision making (see chart for details). I spoke with Dr. Delton Coombes and he agrees with admission and he will see the patient tomorrow   MDM Rules/Calculators/A&P                          Patient with significant worsening of his lung cancer and severe dehydration and malnutrition.  He will be admitted to medicine with oncology consult Final Clinical Impression(s) / ED Diagnoses Final diagnoses:  Dehydration    Rx / DC Orders ED Discharge Orders    None       Milton Ferguson, MD 02/11/2021 2111  All other components within normal limits  RESP PANEL BY RT-PCR (FLU A&B, COVID) ARPGX2  CULTURE, BLOOD (ROUTINE X 2)  CULTURE, BLOOD (ROUTINE X 2)    EKG None  Radiology CT Chest W Contrast  Result Date: 02/06/2021 CLINICAL DATA:  Abnormal x-ray.  Lung nodule. EXAM: CT CHEST WITH  CONTRAST TECHNIQUE: Multidetector CT imaging of the chest was performed during intravenous contrast administration. CONTRAST:  49mL OMNIPAQUE IOHEXOL 300 MG/ML  SOLN COMPARISON:  March 19, 2020 FINDINGS: Cardiovascular: Normal heart size. No pericardial effusion. Mild aneurysmal dilation of the ascending aorta measuring 4 cm in cross-section. Calcific atherosclerotic disease of the aorta and coronary arteries. Mediastinum/Nodes: Right pericardial lymph node measuring 1.6 cm in short axis. Mild diffuse thickening of the esophagus. The major airways are patent. Lungs/Pleura: Too numerous to count bilateral ground-glass, solid and mixed attenuation nodules with lower lobe predominance. Some of the lesions demonstrate cavitations. He dominant such nodule in the posterior left lower lobe measures 2.7 cm. A dominant solid nodule in the right lower lobe measures 2.7 cm. No evidence of airspace consolidation pleural effusion or pneumothorax. Upper Abdomen: Numerous, some confluent hypoattenuated masses throughout the visualized portion of the liver highly suspicious for metastatic disease. Individual lesions measure up to 3 cm. Bilateral adrenal masses, the larger on the left measuring 3.7 cm. Musculoskeletal: Fracture of posterior right T11 rib, likely pathologic. Lytic lesions within the T4 vertebral body, T6 vertebral body, and T8 vertebral body, likely metastatic. Other more subtle lytic lesions within thoracic vertebral bodies. Some of the lesions demonstrate perispinal soft tissue component. IMPRESSION: 1. Too numerous to count bilateral ground-glass, solid and mixed attenuation pulmonary nodules with lower lobe predominance. Some of the lesions demonstrate cavitations. These findings are most consistent with metastatic disease. 2. Numerous, some confluent hypoattenuated masses throughout the visualized portion of the liver highly suspicious for metastatic disease. 3. Bilateral adrenal masses, the larger on the left  measuring 3.7 cm, also consistent with metastatic disease. 4. Lytic lesions within the T4, T6 and T8 vertebral bodies, likely metastatic. Some of the lesions demonstrate perispinal soft tissue component. 5. Fracture of posterior right T11 rib, likely pathologic. 6. Mild aneurysmal dilation of the ascending aorta measuring 4 cm in cross-section. 7. Calcific atherosclerotic disease of the aorta and coronary arteries. Aortic Atherosclerosis (ICD10-I70.0). Electronically Signed   By: Fidela Salisbury M.D.   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 FINDINGS: Single frontal view of the chest demonstrates a stable right chest wall port. Cardiac silhouette is unremarkable. Nodular bibasilar airspace disease greatest in the left lower lobe, which could reflect pneumonia or progressive malignancy. Spiculated area left upper lobe seen on prior PET scan overlying the left posterior sixth rib does not appear grossly changed. No effusion or pneumothorax. No acute bony abnormalities. Peg tube overlies left upper quadrant. IMPRESSION: 1. Bibasilar nodular airspace disease greatest in the left lower lobe, which may reflect pneumonia or progressive malignancy. 2. Stable spiculated density within the left upper lobe unchanged. This area was equivocal for malignancy on prior PET scan. 3. The metabolically active left upper lobe pulmonary nodule seen on prior PET scan is not readily apparent by radiograph. Electronically Signed   By: Randa Ngo M.D.   On: 02/16/2021 16:57    Procedures Procedures   Medications Ordered  All other components within normal limits  RESP PANEL BY RT-PCR (FLU A&B, COVID) ARPGX2  CULTURE, BLOOD (ROUTINE X 2)  CULTURE, BLOOD (ROUTINE X 2)    EKG None  Radiology CT Chest W Contrast  Result Date: 02/06/2021 CLINICAL DATA:  Abnormal x-ray.  Lung nodule. EXAM: CT CHEST WITH  CONTRAST TECHNIQUE: Multidetector CT imaging of the chest was performed during intravenous contrast administration. CONTRAST:  49mL OMNIPAQUE IOHEXOL 300 MG/ML  SOLN COMPARISON:  March 19, 2020 FINDINGS: Cardiovascular: Normal heart size. No pericardial effusion. Mild aneurysmal dilation of the ascending aorta measuring 4 cm in cross-section. Calcific atherosclerotic disease of the aorta and coronary arteries. Mediastinum/Nodes: Right pericardial lymph node measuring 1.6 cm in short axis. Mild diffuse thickening of the esophagus. The major airways are patent. Lungs/Pleura: Too numerous to count bilateral ground-glass, solid and mixed attenuation nodules with lower lobe predominance. Some of the lesions demonstrate cavitations. He dominant such nodule in the posterior left lower lobe measures 2.7 cm. A dominant solid nodule in the right lower lobe measures 2.7 cm. No evidence of airspace consolidation pleural effusion or pneumothorax. Upper Abdomen: Numerous, some confluent hypoattenuated masses throughout the visualized portion of the liver highly suspicious for metastatic disease. Individual lesions measure up to 3 cm. Bilateral adrenal masses, the larger on the left measuring 3.7 cm. Musculoskeletal: Fracture of posterior right T11 rib, likely pathologic. Lytic lesions within the T4 vertebral body, T6 vertebral body, and T8 vertebral body, likely metastatic. Other more subtle lytic lesions within thoracic vertebral bodies. Some of the lesions demonstrate perispinal soft tissue component. IMPRESSION: 1. Too numerous to count bilateral ground-glass, solid and mixed attenuation pulmonary nodules with lower lobe predominance. Some of the lesions demonstrate cavitations. These findings are most consistent with metastatic disease. 2. Numerous, some confluent hypoattenuated masses throughout the visualized portion of the liver highly suspicious for metastatic disease. 3. Bilateral adrenal masses, the larger on the left  measuring 3.7 cm, also consistent with metastatic disease. 4. Lytic lesions within the T4, T6 and T8 vertebral bodies, likely metastatic. Some of the lesions demonstrate perispinal soft tissue component. 5. Fracture of posterior right T11 rib, likely pathologic. 6. Mild aneurysmal dilation of the ascending aorta measuring 4 cm in cross-section. 7. Calcific atherosclerotic disease of the aorta and coronary arteries. Aortic Atherosclerosis (ICD10-I70.0). Electronically Signed   By: Fidela Salisbury M.D.   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 FINDINGS: Single frontal view of the chest demonstrates a stable right chest wall port. Cardiac silhouette is unremarkable. Nodular bibasilar airspace disease greatest in the left lower lobe, which could reflect pneumonia or progressive malignancy. Spiculated area left upper lobe seen on prior PET scan overlying the left posterior sixth rib does not appear grossly changed. No effusion or pneumothorax. No acute bony abnormalities. Peg tube overlies left upper quadrant. IMPRESSION: 1. Bibasilar nodular airspace disease greatest in the left lower lobe, which may reflect pneumonia or progressive malignancy. 2. Stable spiculated density within the left upper lobe unchanged. This area was equivocal for malignancy on prior PET scan. 3. The metabolically active left upper lobe pulmonary nodule seen on prior PET scan is not readily apparent by radiograph. Electronically Signed   By: Randa Ngo M.D.   On: 02/16/2021 16:57    Procedures Procedures   Medications Ordered  All other components within normal limits  RESP PANEL BY RT-PCR (FLU A&B, COVID) ARPGX2  CULTURE, BLOOD (ROUTINE X 2)  CULTURE, BLOOD (ROUTINE X 2)    EKG None  Radiology CT Chest W Contrast  Result Date: 02/06/2021 CLINICAL DATA:  Abnormal x-ray.  Lung nodule. EXAM: CT CHEST WITH  CONTRAST TECHNIQUE: Multidetector CT imaging of the chest was performed during intravenous contrast administration. CONTRAST:  49mL OMNIPAQUE IOHEXOL 300 MG/ML  SOLN COMPARISON:  March 19, 2020 FINDINGS: Cardiovascular: Normal heart size. No pericardial effusion. Mild aneurysmal dilation of the ascending aorta measuring 4 cm in cross-section. Calcific atherosclerotic disease of the aorta and coronary arteries. Mediastinum/Nodes: Right pericardial lymph node measuring 1.6 cm in short axis. Mild diffuse thickening of the esophagus. The major airways are patent. Lungs/Pleura: Too numerous to count bilateral ground-glass, solid and mixed attenuation nodules with lower lobe predominance. Some of the lesions demonstrate cavitations. He dominant such nodule in the posterior left lower lobe measures 2.7 cm. A dominant solid nodule in the right lower lobe measures 2.7 cm. No evidence of airspace consolidation pleural effusion or pneumothorax. Upper Abdomen: Numerous, some confluent hypoattenuated masses throughout the visualized portion of the liver highly suspicious for metastatic disease. Individual lesions measure up to 3 cm. Bilateral adrenal masses, the larger on the left measuring 3.7 cm. Musculoskeletal: Fracture of posterior right T11 rib, likely pathologic. Lytic lesions within the T4 vertebral body, T6 vertebral body, and T8 vertebral body, likely metastatic. Other more subtle lytic lesions within thoracic vertebral bodies. Some of the lesions demonstrate perispinal soft tissue component. IMPRESSION: 1. Too numerous to count bilateral ground-glass, solid and mixed attenuation pulmonary nodules with lower lobe predominance. Some of the lesions demonstrate cavitations. These findings are most consistent with metastatic disease. 2. Numerous, some confluent hypoattenuated masses throughout the visualized portion of the liver highly suspicious for metastatic disease. 3. Bilateral adrenal masses, the larger on the left  measuring 3.7 cm, also consistent with metastatic disease. 4. Lytic lesions within the T4, T6 and T8 vertebral bodies, likely metastatic. Some of the lesions demonstrate perispinal soft tissue component. 5. Fracture of posterior right T11 rib, likely pathologic. 6. Mild aneurysmal dilation of the ascending aorta measuring 4 cm in cross-section. 7. Calcific atherosclerotic disease of the aorta and coronary arteries. Aortic Atherosclerosis (ICD10-I70.0). Electronically Signed   By: Fidela Salisbury M.D.   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 FINDINGS: Single frontal view of the chest demonstrates a stable right chest wall port. Cardiac silhouette is unremarkable. Nodular bibasilar airspace disease greatest in the left lower lobe, which could reflect pneumonia or progressive malignancy. Spiculated area left upper lobe seen on prior PET scan overlying the left posterior sixth rib does not appear grossly changed. No effusion or pneumothorax. No acute bony abnormalities. Peg tube overlies left upper quadrant. IMPRESSION: 1. Bibasilar nodular airspace disease greatest in the left lower lobe, which may reflect pneumonia or progressive malignancy. 2. Stable spiculated density within the left upper lobe unchanged. This area was equivocal for malignancy on prior PET scan. 3. The metabolically active left upper lobe pulmonary nodule seen on prior PET scan is not readily apparent by radiograph. Electronically Signed   By: Randa Ngo M.D.   On: 02/16/2021 16:57    Procedures Procedures   Medications Ordered  All other components within normal limits  RESP PANEL BY RT-PCR (FLU A&B, COVID) ARPGX2  CULTURE, BLOOD (ROUTINE X 2)  CULTURE, BLOOD (ROUTINE X 2)    EKG None  Radiology CT Chest W Contrast  Result Date: 02/06/2021 CLINICAL DATA:  Abnormal x-ray.  Lung nodule. EXAM: CT CHEST WITH  CONTRAST TECHNIQUE: Multidetector CT imaging of the chest was performed during intravenous contrast administration. CONTRAST:  49mL OMNIPAQUE IOHEXOL 300 MG/ML  SOLN COMPARISON:  March 19, 2020 FINDINGS: Cardiovascular: Normal heart size. No pericardial effusion. Mild aneurysmal dilation of the ascending aorta measuring 4 cm in cross-section. Calcific atherosclerotic disease of the aorta and coronary arteries. Mediastinum/Nodes: Right pericardial lymph node measuring 1.6 cm in short axis. Mild diffuse thickening of the esophagus. The major airways are patent. Lungs/Pleura: Too numerous to count bilateral ground-glass, solid and mixed attenuation nodules with lower lobe predominance. Some of the lesions demonstrate cavitations. He dominant such nodule in the posterior left lower lobe measures 2.7 cm. A dominant solid nodule in the right lower lobe measures 2.7 cm. No evidence of airspace consolidation pleural effusion or pneumothorax. Upper Abdomen: Numerous, some confluent hypoattenuated masses throughout the visualized portion of the liver highly suspicious for metastatic disease. Individual lesions measure up to 3 cm. Bilateral adrenal masses, the larger on the left measuring 3.7 cm. Musculoskeletal: Fracture of posterior right T11 rib, likely pathologic. Lytic lesions within the T4 vertebral body, T6 vertebral body, and T8 vertebral body, likely metastatic. Other more subtle lytic lesions within thoracic vertebral bodies. Some of the lesions demonstrate perispinal soft tissue component. IMPRESSION: 1. Too numerous to count bilateral ground-glass, solid and mixed attenuation pulmonary nodules with lower lobe predominance. Some of the lesions demonstrate cavitations. These findings are most consistent with metastatic disease. 2. Numerous, some confluent hypoattenuated masses throughout the visualized portion of the liver highly suspicious for metastatic disease. 3. Bilateral adrenal masses, the larger on the left  measuring 3.7 cm, also consistent with metastatic disease. 4. Lytic lesions within the T4, T6 and T8 vertebral bodies, likely metastatic. Some of the lesions demonstrate perispinal soft tissue component. 5. Fracture of posterior right T11 rib, likely pathologic. 6. Mild aneurysmal dilation of the ascending aorta measuring 4 cm in cross-section. 7. Calcific atherosclerotic disease of the aorta and coronary arteries. Aortic Atherosclerosis (ICD10-I70.0). Electronically Signed   By: Fidela Salisbury M.D.   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 FINDINGS: Single frontal view of the chest demonstrates a stable right chest wall port. Cardiac silhouette is unremarkable. Nodular bibasilar airspace disease greatest in the left lower lobe, which could reflect pneumonia or progressive malignancy. Spiculated area left upper lobe seen on prior PET scan overlying the left posterior sixth rib does not appear grossly changed. No effusion or pneumothorax. No acute bony abnormalities. Peg tube overlies left upper quadrant. IMPRESSION: 1. Bibasilar nodular airspace disease greatest in the left lower lobe, which may reflect pneumonia or progressive malignancy. 2. Stable spiculated density within the left upper lobe unchanged. This area was equivocal for malignancy on prior PET scan. 3. The metabolically active left upper lobe pulmonary nodule seen on prior PET scan is not readily apparent by radiograph. Electronically Signed   By: Randa Ngo M.D.   On: 02/16/2021 16:57    Procedures Procedures   Medications Ordered
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
## 2021-03-08 DIAGNOSIS — Z515 Encounter for palliative care: Secondary | ICD-10-CM | POA: Diagnosis not present

## 2021-03-08 DIAGNOSIS — J449 Chronic obstructive pulmonary disease, unspecified: Secondary | ICD-10-CM | POA: Diagnosis present

## 2021-03-08 DIAGNOSIS — Z9221 Personal history of antineoplastic chemotherapy: Secondary | ICD-10-CM | POA: Diagnosis not present

## 2021-03-08 DIAGNOSIS — M109 Gout, unspecified: Secondary | ICD-10-CM | POA: Diagnosis present

## 2021-03-08 DIAGNOSIS — C7951 Secondary malignant neoplasm of bone: Secondary | ICD-10-CM | POA: Diagnosis present

## 2021-03-08 DIAGNOSIS — E43 Unspecified severe protein-calorie malnutrition: Secondary | ICD-10-CM | POA: Insufficient documentation

## 2021-03-08 DIAGNOSIS — J690 Pneumonitis due to inhalation of food and vomit: Secondary | ICD-10-CM | POA: Diagnosis present

## 2021-03-08 DIAGNOSIS — E860 Dehydration: Secondary | ICD-10-CM | POA: Diagnosis not present

## 2021-03-08 DIAGNOSIS — B370 Candidal stomatitis: Secondary | ICD-10-CM | POA: Diagnosis present

## 2021-03-08 DIAGNOSIS — Z20822 Contact with and (suspected) exposure to covid-19: Secondary | ICD-10-CM | POA: Diagnosis present

## 2021-03-08 DIAGNOSIS — C069 Malignant neoplasm of mouth, unspecified: Secondary | ICD-10-CM | POA: Diagnosis present

## 2021-03-08 DIAGNOSIS — Z7189 Other specified counseling: Secondary | ICD-10-CM | POA: Diagnosis not present

## 2021-03-08 DIAGNOSIS — K219 Gastro-esophageal reflux disease without esophagitis: Secondary | ICD-10-CM | POA: Diagnosis present

## 2021-03-08 DIAGNOSIS — E1140 Type 2 diabetes mellitus with diabetic neuropathy, unspecified: Secondary | ICD-10-CM | POA: Diagnosis present

## 2021-03-08 DIAGNOSIS — F32A Depression, unspecified: Secondary | ICD-10-CM | POA: Diagnosis present

## 2021-03-08 DIAGNOSIS — C3412 Malignant neoplasm of upper lobe, left bronchus or lung: Secondary | ICD-10-CM | POA: Diagnosis present

## 2021-03-08 DIAGNOSIS — R0902 Hypoxemia: Secondary | ICD-10-CM | POA: Diagnosis not present

## 2021-03-08 DIAGNOSIS — Z66 Do not resuscitate: Secondary | ICD-10-CM | POA: Diagnosis present

## 2021-03-08 DIAGNOSIS — Z923 Personal history of irradiation: Secondary | ICD-10-CM | POA: Diagnosis not present

## 2021-03-08 DIAGNOSIS — C7989 Secondary malignant neoplasm of other specified sites: Secondary | ICD-10-CM | POA: Diagnosis present

## 2021-03-08 DIAGNOSIS — H5462 Unqualified visual loss, left eye, normal vision right eye: Secondary | ICD-10-CM | POA: Diagnosis present

## 2021-03-08 DIAGNOSIS — R627 Adult failure to thrive: Secondary | ICD-10-CM | POA: Diagnosis present

## 2021-03-08 DIAGNOSIS — C109 Malignant neoplasm of oropharynx, unspecified: Secondary | ICD-10-CM | POA: Diagnosis present

## 2021-03-08 DIAGNOSIS — R64 Cachexia: Secondary | ICD-10-CM | POA: Diagnosis present

## 2021-03-08 DIAGNOSIS — Z681 Body mass index (BMI) 19 or less, adult: Secondary | ICD-10-CM | POA: Diagnosis not present

## 2021-03-08 DIAGNOSIS — L8990 Pressure ulcer of unspecified site, unspecified stage: Secondary | ICD-10-CM | POA: Insufficient documentation

## 2021-03-08 DIAGNOSIS — Z931 Gastrostomy status: Secondary | ICD-10-CM | POA: Diagnosis not present

## 2021-03-08 DIAGNOSIS — I10 Essential (primary) hypertension: Secondary | ICD-10-CM | POA: Diagnosis present

## 2021-03-08 DIAGNOSIS — E119 Type 2 diabetes mellitus without complications: Secondary | ICD-10-CM | POA: Diagnosis not present

## 2021-03-08 DIAGNOSIS — C787 Secondary malignant neoplasm of liver and intrahepatic bile duct: Secondary | ICD-10-CM | POA: Diagnosis present

## 2021-03-08 LAB — COMPREHENSIVE METABOLIC PANEL
ALT: 28 U/L (ref 0–44)
AST: 29 U/L (ref 15–41)
Albumin: 2.5 g/dL — ABNORMAL LOW (ref 3.5–5.0)
Alkaline Phosphatase: 143 U/L — ABNORMAL HIGH (ref 38–126)
Anion gap: 10 (ref 5–15)
BUN: 21 mg/dL (ref 8–23)
CO2: 26 mmol/L (ref 22–32)
Calcium: 9 mg/dL (ref 8.9–10.3)
Chloride: 106 mmol/L (ref 98–111)
Creatinine, Ser: 0.4 mg/dL — ABNORMAL LOW (ref 0.61–1.24)
GFR, Estimated: >60 mL/min (ref >60)
Glucose, Bld: 84 mg/dL (ref 70–99)
Potassium: 3.3 mmol/L — ABNORMAL LOW (ref 3.5–5.1)
Sodium: 142 mmol/L (ref 135–145)
Total Bilirubin: 0.7 mg/dL (ref 0.3–1.2)
Total Protein: 5.4 g/dL — ABNORMAL LOW (ref 6.5–8.1)

## 2021-03-08 LAB — CBC
HCT: 33.5 % — ABNORMAL LOW (ref 39.0–52.0)
Hemoglobin: 10.7 g/dL — ABNORMAL LOW (ref 13.0–17.0)
MCH: 29.9 pg (ref 26.0–34.0)
MCHC: 31.9 g/dL (ref 30.0–36.0)
MCV: 93.6 fL (ref 80.0–100.0)
Platelets: 389 10*3/uL (ref 150–400)
RBC: 3.58 MIL/uL — ABNORMAL LOW (ref 4.22–5.81)
RDW: 14.3 % (ref 11.5–15.5)
WBC: 18.9 10*3/uL — ABNORMAL HIGH (ref 4.0–10.5)
nRBC: 0 % (ref 0.0–0.2)

## 2021-03-08 LAB — GLUCOSE, CAPILLARY
Glucose-Capillary: 85 mg/dL (ref 70–99)
Glucose-Capillary: 78 mg/dL (ref 70–99)
Glucose-Capillary: 96 mg/dL (ref 70–99)
Glucose-Capillary: 113 mg/dL — ABNORMAL HIGH (ref 70–99)

## 2021-03-08 LAB — PHOSPHORUS: Phosphorus: 2.4 mg/dL — ABNORMAL LOW (ref 2.5–4.6)

## 2021-03-08 LAB — MAGNESIUM: Magnesium: 1.7 mg/dL (ref 1.7–2.4)

## 2021-03-08 LAB — HIV ANTIBODY (ROUTINE TESTING W REFLEX): HIV Screen 4th Generation wRfx: NONREACTIVE

## 2021-03-08 LAB — HEMOGLOBIN A1C
Hgb A1c MFr Bld: 6 % — ABNORMAL HIGH (ref 4.8–5.6)
Mean Plasma Glucose: 125.5 mg/dL

## 2021-03-08 MED ORDER — FLUCONAZOLE IN SODIUM CHLORIDE 200-0.9 MG/100ML-% IV SOLN
200.0000 mg | INTRAVENOUS | Status: AC
Start: 2021-03-08 — End: 2021-03-10
  Administered 2021-03-08 – 2021-03-10 (×3): 200 mg via INTRAVENOUS
  Filled 2021-03-08 (×3): qty 100

## 2021-03-08 MED ORDER — MAGIC MOUTHWASH W/LIDOCAINE
15.0000 mL | Freq: Three times a day (TID) | ORAL | Status: DC | PRN
  Administered 2021-03-08: 15 mL via ORAL
  Filled 2021-03-08: qty 15

## 2021-03-08 MED ORDER — PROSOURCE TF PO LIQD
90.0000 mL | Freq: Four times a day (QID) | ORAL | Status: DC
  Administered 2021-03-08 – 2021-03-10 (×9): 90 mL
  Filled 2021-03-08 (×9): qty 90

## 2021-03-08 MED ORDER — NYSTATIN 100000 UNIT/ML MT SUSP
5.0000 mL | Freq: Four times a day (QID) | OROMUCOSAL | Status: DC
  Administered 2021-03-08 – 2021-03-14 (×24): 500000 [IU] via OROMUCOSAL
  Filled 2021-03-08 (×24): qty 5

## 2021-03-08 MED ORDER — HYDROMORPHONE HCL 1 MG/ML IJ SOLN
1.0000 mg | INTRAMUSCULAR | Status: DC | PRN
  Administered 2021-03-08 – 2021-03-13 (×10): 1 mg via INTRAVENOUS
  Filled 2021-03-08 (×10): qty 1

## 2021-03-08 MED ORDER — CHLORHEXIDINE GLUCONATE CLOTH 2 % EX PADS
6.0000 | MEDICATED_PAD | Freq: Every day | CUTANEOUS | Status: DC
  Administered 2021-03-08 – 2021-03-13 (×6): 6 via TOPICAL

## 2021-03-08 MED ORDER — OSMOLITE 1.5 CAL PO LIQD
1000.0000 mL | ORAL | Status: DC
  Administered 2021-03-08 – 2021-03-09 (×2): 1000 mL

## 2021-03-08 NOTE — Progress Notes (Signed)
Patient stated that he can eat and drink. He stated that he does not eat much because of the mouth pain he has. G-tube flushed.

## 2021-03-08 NOTE — Progress Notes (Signed)
Palliative Medicine RN Note: Consult order noted for [place]. PMT provider is due to return to [facility] on Tuesday, April 5. If the patient remains admitted at that time, he will be evaluated then. If recommendations are needed in the interim, please call our office at [phone].  Marjie Skiff Darneshia Demary, RN, BSN, [facility] Palliative Medicine Team 03/08/2021 9:28 AM Office [phone]

## 2021-03-08 NOTE — Progress Notes (Addendum)
Initial Nutrition Assessment  DOCUMENTATION CODES:   Severe malnutrition in context of chronic illness,Underweight  INTERVENTION:  The first 24 hrs: Osmolite 1.5 @ 20 ml/hr via PEG  ProSource TF 90 ml- QID per tube - provides 320 kcal, 88 gr protein  Provides 102 gr CHO, 118 gr protein and 366 ml water.  Monitor magnesium, potassium, and phosphorus daily for at least 3 days, MD to replete as needed, as pt is at risk for refeeding syndrome given his malnourished state  Obtain daily weights   NUTRITION DIAGNOSIS:   Severe Malnutrition related to catabolic illness,social / environmental circumstances as evidenced by  severe fat depletion,severe muscle depletion (metastatic cancer, lives alone (caregiver passed away months ago)) FTT. Patient has not been using PEG tube and limited intake of po orally.   GOAL:  Patient will meet greater than or equal to 90% of their needs if feasible given his cancer diagnosis. Palliative consult pending to discuss [place].  MONITOR:  TF tolerance,Weight trends,Skin,I & O's,Labs,PO intake  REASON FOR ASSESSMENT:   Consult Assessment of nutrition requirement/status  ASSESSMENT: Patient is a 70 yo with a hx of COPD, DM, HTN, cancer of the mouth and lung. He has a PEG tube (not using) and is able to take purreed foods. Failure to thrive, severe malnutrition -metastatic cancer.   Patient was able to take some lunch 65% documented. He has not been using PEG at home. Limited diet hx recall. Glucerna 1.5 (7 cartons daily) per medication history.  Discussed with MD- will substitute Osmolite 1.5 and start at a low rate and gradually advance due to his refeeding risk.   Weight history reviewed.   Medications: insulin, protonix  IV-NS/ KCL @ 75 ml/hr   Labs reviewed: potassium 3.3 (L), Phos 2.4 (L), Mag 1.7 (wnl), Albumin 2.5 (L) WBC-18.9 (H)  NUTRITION - FOCUSED PHYSICAL EXAM:  Flowsheet Row Most Recent Value  Orbital Region Severe depletion  Upper Arm  Region Severe depletion  Thoracic and Lumbar Region Severe depletion  Buccal Region Moderate depletion  Temple Region Moderate depletion  Clavicle Bone Region Severe depletion  Clavicle and Acromion Bone Region Severe depletion  Scapular Bone Region Severe depletion  Dorsal Hand Moderate depletion  Patellar Region Severe depletion  Anterior Thigh Region Severe depletion  Posterior Calf Region Severe depletion  Edema (RD Assessment) Mild  Hair Reviewed  Eyes Reviewed  Mouth Reviewed  Skin Reviewed  Nails Reviewed     Diet Order:   Diet Order            DIET - DYS 1 Room service appropriate? Yes; Fluid consistency: Nectar Thick  Diet effective now                 EDUCATION NEEDS:  Not appropriate for education at this time  Skin:  Skin Assessment: Skin Integrity Issues: Skin Integrity Issues:: Stage III Stage III: sacrum  Last BM:  prior to admission  Height:   Ht Readings from Last 1 Encounters:  02/17/2021 6' (1.829 m)    Weight:   Wt Readings from Last 1 Encounters:  04/05/20 65.4 kg    Ideal Body Weight:   81 kg  BMI:  Body mass index is 19.54 kg/m.  Estimated Nutritional Needs:   Kcal:  1950-2200  Protein:  111-124 gr  Fluid:  2 liters daily  Colman Cater MS,RD,CSG,LDN Pager: Shea Evans

## 2021-03-08 NOTE — Progress Notes (Addendum)
PROGRESS NOTE    Lance [name]  BMW:[id number] DOB: December 20, 1950 DOA: 04/04/21 PCP: [name], MD   Chief Complaint  Patient presents with  . Failure To Thrive    Brief admission narrative:  As per H&P written by Dr. Randol Kern on 2021-04-04 Lance [name]  is a 70 y.o. 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, and non-small cell lung cancer, status post chemo and radiation therapy, most recently patient was seen by his oncologist in April 2021, patient failed to show up on his follow-up appointment(despite receiving multiple voicemail as noted on epic), patient has PEG tube, apparently his caregiver (girlfriend) passed away last Sep 25, 2023, patient was brought by her sister for failure to thrive, patient reports has been having cough for few days, has been feeling dyspneic as well, reports he is having very poor oral intake, usually he is on pured diet, drinking little fluids as well, he has a PEG tube, but reports he has not used it for a while, he denies fever, chills, chest pain. - in ED work-up was significant for albumin of 2.6, white blood cell count of 20K, for widely metastatic disease with multiple bilateral nodules, liver metastasis, abdominal metastasis, and lytic lesions in vertebral bodies,[facility] hospitalist consulted to admit.  Assessment & Plan: 1-Oropharyngeal cancer [name]) -Patient receiving radiotherapy and chemotherapy; has ended losing follow-up and at this time presenting with overall worsening condition failure to thrive. -Patient understands there is no acute and is looking to see improvement of overall symptoms management, hydration and nutrition. -Pending further recommendations by oncology service -Palliative also consulted.  2-Oral thrush -Will treat with nystatin and Diflucan -Follow response  3-dysphagia -Continue maintain hydration and nutrition through PEG tube -Dietitian consult -Speech therapy also asked to see  patient -Dysphagia 1 nectar thick liquids only for now.  4-COPD (chronic obstructive pulmonary disease) (HCC) -No wheezing and no using accessory muscles -Continue bronchodilator management.  5-chronic pain syndrome -Continue home narcotics regimen -Follow clinical response.  6-type 2 diabetes -Continue sliding scale insulin with close CBGs hematology -Holding oral hypoglycemic agents while inpatient.  7-hypertension -Vital signs stable -Holding oral antihypertensive agents in the setting of dehydration and failure to thrive. -follow BP  8-failure to thrive/severe protein continue nutrition -As mentioned above initiate treatment with tube feedings through PEG tube -Follow electrolytes for refeeding syndrome  DVT prophylaxis: Lovenox Code Status: DNR Family Communication: No family at bedside. Disposition:   Status is: Inpatient  Dispo: The patient is from: Home              Anticipated d/c is to: To be determined              Patient currently is not medically stable for discharge; still having difficulty with swallowing and initiating PEG tube feedings; close monitoring to minimize the chances for refeeding syndrome.   Difficult to place patient : No       Consultants:  Palliative care Dietitian service   Procedures:  See below for x-ray reports   Antimicrobials:  None   Subjective: Significantly weak and deconditioned; having trouble tolerating dysphagia 1 nectar thick liquid diet.  Positive thrush appreciated on exam.  No nausea, no vomiting, no chest pain.  Objective: Vitals:   04/04/21 2100 Apr 04, 2021 2320 03/08/21 0436 03/08/21 1446  BP: 130/65 136/85 (!) 142/81 116/71  Pulse: 84 73 94 72  Resp: 18 19 19 18   Temp:  98 F (36.7 C) 98 F (36.7 C) 97.8 F (36.6  C)  TempSrc:    Oral  SpO2: 93% 95% 92% 94%  Height:        Intake/Output Summary (Last 24 hours) at 03/08/2021 1554 Last data filed at 03/08/2021 1549 Gross per 24 hour  Intake 1671.47 ml   Output 700 ml  Net 971.47 ml   There were no vitals filed for this visit.  Examination:  General exam: Chronically ill in appearance, cachectic and expressing diffuse generalized body aches.  Per nursing report intermittent coughing spells while attempting dysphagia 1 with honey nectar liquids.  No fever, no nausea vomiting. Respiratory system: Positive scattered rhonchi; no wheezing, no crackles.  No using accessory muscle. Cardiovascular system: Rate controlled, no rubs, no gallops or JVD appreciated. Gastrointestinal system: Abdomen is nondistended, soft and with positive bowel sounds appreciated.  PEG tube in place. Central nervous system: No focal neurological deficits. Extremities: No cyanosis or clubbing. Skin: No petechiae.  Left Cheek/mandible lesion, no active drainage.  Refer to epic media for appreciated visualization. Psychiatry: Judgement and insight appear normal. Mood & affect appropriate.     Data Reviewed: I have personally reviewed following labs and imaging studies  CBC: Recent Labs  Lab 03-Apr-2021 1630 03/08/21 0437  WBC 20.0* 18.9*  NEUTROABS 18.7*  --   HGB 11.0* 10.7*  HCT 34.9* 33.5*  MCV 94.1 93.6  PLT 402* 389    Basic Metabolic Panel: Recent Labs  Lab 04-03-21 1630 03/08/21 0437  NA 141 142  K 3.6 3.3*  CL 103 106  CO2 28 26  GLUCOSE 148* 84  BUN 28* 21  CREATININE 0.57* 0.40*  CALCIUM 9.2 9.0  MG  --  1.7  PHOS  --  2.4*    GFR: CrCl cannot be calculated (Unknown ideal weight.).  Liver Function Tests: Recent Labs  Lab 03-Apr-2021 1630 03/08/21 0437  AST 40 29  ALT 33 28  ALKPHOS 166* 143*  BILITOT 0.7 0.7  PROT 5.7* 5.4*  ALBUMIN 2.6* 2.5*    CBG: Recent Labs  Lab Apr 03, 2021 2334 03/08/21 0733 03/08/21 1111  GLUCAP 113* 85 78     Recent Results (from the past 240 hour(s))  Resp Panel by RT-PCR (Flu A&B, Covid) Nasopharyngeal Swab     Status: None   Collection Time: 04/03/2021  4:30 PM   Specimen: Nasopharyngeal Swab;  Nasopharyngeal(NP) swabs in vial transport medium  Result Value Ref Range Status   SARS Coronavirus 2 by RT PCR NEGATIVE NEGATIVE Final    Comment: (NOTE) SARS-CoV-2 target nucleic acids are NOT DETECTED.  The SARS-CoV-2 RNA is generally detectable in upper respiratory specimens during the acute phase of infection. The lowest concentration of SARS-CoV-2 viral copies this assay can detect is 138 copies/mL. A negative result does not preclude SARS-Cov-2 infection and should not be used as the sole basis for treatment or other patient management decisions. A negative result may occur with  improper specimen collection/handling, submission of specimen other than nasopharyngeal swab, presence of viral mutation(s) within the areas targeted by this assay, and inadequate number of viral copies(<138 copies/mL). A negative result must be combined with clinical observations, patient history, and epidemiological information. The expected result is Negative.  Fact Sheet for Patients:  BloggerCourse.com  Fact Sheet for Healthcare Providers:  SeriousBroker.it  This test is no t yet approved or cleared by the [place] FDA and  has been authorized for detection and/or diagnosis of SARS-CoV-2 by FDA under an Emergency Use Authorization (EUA). This EUA will remain  in effect (meaning this  test can be used) for the duration of the COVID-19 declaration under Section 564(b)(1) of the Act, 21 U.S.C.section 360bbb-3(b)(1), unless the authorization is terminated  or revoked sooner.       Influenza A by PCR NEGATIVE NEGATIVE Final   Influenza B by PCR NEGATIVE NEGATIVE Final    Comment: (NOTE) The Xpert Xpress SARS-CoV-2/FLU/RSV plus assay is intended as an aid in the diagnosis of influenza from Nasopharyngeal swab specimens and should not be used as a sole basis for treatment. Nasal washings and aspirates are unacceptable for Xpert Xpress  SARS-CoV-2/FLU/RSV testing.  Fact Sheet for Patients: BloggerCourse.com  Fact Sheet for Healthcare Providers: SeriousBroker.it  This test is not yet approved or cleared by the [place] FDA and has been authorized for detection and/or diagnosis of SARS-CoV-2 by FDA under an Emergency Use Authorization (EUA). This EUA will remain in effect (meaning this test can be used) for the duration of the COVID-19 declaration under Section 564(b)(1) of the Act, 21 U.S.C. section 360bbb-3(b)(1), unless the authorization is terminated or revoked.  Performed at [facility], [address] [place], [place] [address]   Blood culture (routine x 2)     Status: None (Preliminary result)   Collection Time: 02/10/2021  6:39 PM   Specimen: BLOOD LEFT HAND  Result Value Ref Range Status   Specimen Description   Final    BLOOD LEFT HAND Performed at [facility], [address] [place], [place] [address]    Special Requests   Final    BOTTLES DRAWN AEROBIC AND ANAEROBIC Blood Culture adequate volume Performed at [facility], [address] [place], [place] [address]    Culture   Final    NO GROWTH < 24 HOURS Performed at [facility], [address], [place], [place] [address]    Report Status PENDING  Incomplete  Blood culture (routine x 2)     Status: None (Preliminary result)   Collection Time: 02/20/2021  6:42 PM   Specimen: BLOOD LEFT HAND  Result Value Ref Range Status   Specimen Description   Final    BLOOD LEFT HAND Performed at [facility], [address] [place], [place] [address]    Special Requests   Final    BOTTLES DRAWN AEROBIC ONLY Blood Culture adequate volume Performed at [facility], [address] [place], [place] [address]    Culture   Final    NO GROWTH < 24 HOURS Performed at [facility], [address], [place], [place] [address]    Report Status PENDING  Incomplete     Radiology  Studies: CT Chest W Contrast  Result Date: 02/15/2021 CLINICAL DATA:  Abnormal x-ray.  Lung nodule. EXAM: CT CHEST WITH CONTRAST TECHNIQUE: Multidetector CT imaging of the chest was performed during intravenous contrast administration. CONTRAST:  75mL OMNIPAQUE IOHEXOL 300 MG/ML  SOLN COMPARISON:  March 19, 2020 FINDINGS: Cardiovascular: Normal heart size. No pericardial effusion. Mild aneurysmal dilation of the ascending aorta measuring 4 cm in cross-section. Calcific atherosclerotic disease of the aorta and coronary arteries. Mediastinum/Nodes: Right pericardial lymph node measuring 1.6 cm in short axis. Mild diffuse thickening of the esophagus. The major airways are patent. Lungs/Pleura: Too numerous to count bilateral ground-glass, solid and mixed attenuation nodules with lower lobe predominance. Some of the lesions demonstrate cavitations. He dominant such nodule in the posterior left lower lobe measures 2.7 cm. A dominant solid nodule in the right lower lobe measures 2.7 cm. No evidence of airspace consolidation  pleural effusion or pneumothorax. Upper Abdomen: Numerous, some confluent hypoattenuated masses throughout the visualized portion of the liver highly suspicious for metastatic disease. Individual lesions measure up to 3 cm. Bilateral adrenal masses, the larger on the left measuring 3.7 cm. Musculoskeletal: Fracture of posterior right T11 rib, likely pathologic. Lytic lesions within the T4 vertebral body, T6 vertebral body, and T8 vertebral body, likely metastatic. Other more subtle lytic lesions within thoracic vertebral bodies. Some of the lesions demonstrate perispinal soft tissue component. IMPRESSION: 1. Too numerous to count bilateral ground-glass, solid and mixed attenuation pulmonary nodules with lower lobe predominance. Some of the lesions demonstrate cavitations. These findings are most consistent with metastatic disease. 2. Numerous, some confluent hypoattenuated masses throughout the  visualized portion of the liver highly suspicious for metastatic disease. 3. Bilateral adrenal masses, the larger on the left measuring 3.7 cm, also consistent with metastatic disease. 4. Lytic lesions within the T4, T6 and T8 vertebral bodies, likely metastatic. Some of the lesions demonstrate perispinal soft tissue component. 5. Fracture of posterior right T11 rib, likely pathologic. 6. Mild aneurysmal dilation of the ascending aorta measuring 4 cm in cross-section. 7. Calcific atherosclerotic disease of the aorta and coronary arteries. Aortic Atherosclerosis (ICD10-I70.0). Electronically Signed   By: Ted Mcalpine M.D.   On: March 10, 2021 20:40   DG Chest Port 1 View  Result Date: Mar 10, 2021 CLINICAL DATA:  Failure to thrive, weakness, history of squamous cell carcinoma of the floor of the mouth EXAM: PORTABLE CHEST 1 VIEW COMPARISON:  05/16/2019, 03/19/2020 . FINDINGS: Single frontal view of the chest demonstrates a stable right chest wall port. Cardiac silhouette is unremarkable. Nodular bibasilar airspace disease greatest in the left lower lobe, which could reflect pneumonia or progressive malignancy. Spiculated area left upper lobe seen on prior PET scan overlying the left posterior sixth rib does not appear grossly changed. No effusion or pneumothorax. No acute bony abnormalities. Peg tube overlies left upper quadrant. IMPRESSION: 1. Bibasilar nodular airspace disease greatest in the left lower lobe, which may reflect pneumonia or progressive malignancy. 2. Stable spiculated density within the left upper lobe unchanged. This area was equivocal for malignancy on prior PET scan. 3. The metabolically active left upper lobe pulmonary nodule seen on prior PET scan is not readily apparent by radiograph. Electronically Signed   By: Sharlet Salina M.D.   On: 2021-03-10 16:57    Scheduled Meds: . Chlorhexidine Gluconate Cloth  6 each Topical Daily  . enoxaparin (LOVENOX) injection  30 mg Subcutaneous Q24H   . feeding supplement (OSMOLITE 1.5 CAL)  1,000 mL Per Tube Q24H  . feeding supplement (PROSource TF)  90 mL Per Tube QID  . gabapentin  800 mg Oral TID  . insulin aspart  0-6 Units Subcutaneous TID WC  . nystatin  5 mL Mouth/Throat QID  . oxyCODONE  10 mg Oral Q12H  . pantoprazole  40 mg Oral Daily   Continuous Infusions: . 0.9 % NaCl with KCl 20 mEq / L 75 mL/hr at 03/08/21 1303  . fluconazole (DIFLUCAN) IV       LOS: 0 days    Time spent: 35 minutes.    Vassie Loll, MD [facility]   To contact the attending provider between 7A-7P or the covering provider during after hours 7P-7A, please log into the web site [email or web address] and access using universal [place] password for that web site. If you do not have the password, please call the hospital operator.  03/08/2021, 3:54 PM

## 2021-03-08 DEATH — deceased

## 2021-03-09 DIAGNOSIS — R627 Adult failure to thrive: Secondary | ICD-10-CM | POA: Diagnosis not present

## 2021-03-09 DIAGNOSIS — C069 Malignant neoplasm of mouth, unspecified: Secondary | ICD-10-CM | POA: Diagnosis not present

## 2021-03-09 DIAGNOSIS — J449 Chronic obstructive pulmonary disease, unspecified: Secondary | ICD-10-CM | POA: Diagnosis not present

## 2021-03-09 DIAGNOSIS — E860 Dehydration: Secondary | ICD-10-CM | POA: Diagnosis not present

## 2021-03-09 LAB — COMPREHENSIVE METABOLIC PANEL
ALT: 30 U/L (ref 0–44)
AST: 31 U/L (ref 15–41)
Albumin: 2.4 g/dL — ABNORMAL LOW (ref 3.5–5.0)
Alkaline Phosphatase: 161 U/L — ABNORMAL HIGH (ref 38–126)
Anion gap: 7 (ref 5–15)
BUN: 23 mg/dL (ref 8–23)
CO2: 26 mmol/L (ref 22–32)
Calcium: 8.9 mg/dL (ref 8.9–10.3)
Chloride: 108 mmol/L (ref 98–111)
Creatinine, Ser: 0.36 mg/dL — ABNORMAL LOW (ref 0.61–1.24)
GFR, Estimated: >60 mL/min (ref >60)
Glucose, Bld: 116 mg/dL — ABNORMAL HIGH (ref 70–99)
Potassium: 3.6 mmol/L (ref 3.5–5.1)
Sodium: 141 mmol/L (ref 135–145)
Total Bilirubin: 0.5 mg/dL (ref 0.3–1.2)
Total Protein: 5.1 g/dL — ABNORMAL LOW (ref 6.5–8.1)

## 2021-03-09 LAB — GLUCOSE, CAPILLARY
Glucose-Capillary: 114 mg/dL — ABNORMAL HIGH (ref 70–99)
Glucose-Capillary: 141 mg/dL — ABNORMAL HIGH (ref 70–99)
Glucose-Capillary: 131 mg/dL — ABNORMAL HIGH (ref 70–99)
Glucose-Capillary: 144 mg/dL — ABNORMAL HIGH (ref 70–99)

## 2021-03-09 LAB — MAGNESIUM: Magnesium: 1.6 mg/dL — ABNORMAL LOW (ref 1.7–2.4)

## 2021-03-09 LAB — PHOSPHORUS: Phosphorus: 2.8 mg/dL (ref 2.5–4.6)

## 2021-03-09 MED ORDER — ENOXAPARIN SODIUM 40 MG/0.4ML ~~LOC~~ SOLN
40.0000 mg | SUBCUTANEOUS | Status: DC
  Administered 2021-03-10 – 2021-03-14 (×5): 40 mg via SUBCUTANEOUS
  Filled 2021-03-09 (×6): qty 0.4

## 2021-03-09 MED ORDER — RESOURCE THICKENUP CLEAR PO POWD
ORAL | Status: DC | PRN
  Filled 2021-03-09: qty 125

## 2021-03-09 NOTE — Progress Notes (Signed)
PROGRESS NOTE    Lance [name]  VHQ:[id number] DOB: [birth year] DOA: 2021-03-08 PCP: [name], MD   Chief Complaint  Patient presents with  . Failure To Thrive    Brief admission narrative:  As per H&P written by Dr. Randol Kern on Mar 08, 2021 Lance [name]  is a 70 y.o. 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, and non-small cell lung cancer, status post chemo and radiation therapy, most recently patient was seen by his oncologist in April 2021, patient failed to show up on his follow-up appointment(despite receiving multiple voicemail as noted on epic), patient has PEG tube, apparently his caregiver (girlfriend) passed away last 08/29/2023, patient was brought by her sister for failure to thrive, patient reports has been having cough for few days, has been feeling dyspneic as well, reports he is having very poor oral intake, usually he is on pured diet, drinking little fluids as well, he has a PEG tube, but reports he has not used it for a while, he denies fever, chills, chest pain. - in ED work-up was significant for albumin of 2.6, white blood cell count of 20K, for widely metastatic disease with multiple bilateral nodules, liver metastasis, abdominal metastasis, and lytic lesions in vertebral bodies,[facility] hospitalist consulted to admit.  Assessment & Plan: 1-Oropharyngeal cancer [facility]) -Patient receiving radiotherapy and chemotherapy; has ended losing follow-up and at this time presenting with overall worsening condition failure to thrive. -Patient understands there is no acute and is looking to see improvement of overall symptoms management, hydration and nutrition. -Pending further recommendations by oncology service -Palliative also consulted.  2-Oral thrush -Will continue treatment with nystatin and Diflucan -Follow response  3-dysphagia -Continue maintain hydration and nutrition through PEG tube at this point  -Dietitian consultation appreciated   -Speech therapy also asked to see patient, will do MBS -patient NPO currently  4-COPD (chronic obstructive pulmonary disease) (HCC) -No wheezing and no using accessory muscles -Continue bronchodilator management.  5-chronic pain syndrome -Continue PRN narcotics -having trouble with extended release option while unable to give meds orally.   6-type 2 diabetes -Continue sliding scale insulin with close CBGs hematology -continue Holding oral hypoglycemic agents while inpatient.  7-hypertension -Vital signs stable -Holding oral antihypertensive agents in the setting of dehydration and failure to thrive. -follow BP  8-failure to thrive/severe protein continue nutrition -As mentioned above initiate treatment with tube feedings through PEG tube -Follow electrolytes for refeeding syndrome  DVT prophylaxis: Lovenox Code Status: DNR Family Communication: No family at bedside. Disposition:   Status is: Inpatient  Dispo: The patient is from: Home              Anticipated d/c is to: To be determined              Patient currently is not medically stable for discharge; still having difficulty with swallowing and initiating PEG tube feedings; close monitoring to minimize the chances for refeeding syndrome.   Difficult to place patient : No    Consultants:  Palliative care Dietitian service   Procedures:  See below for x-ray reports   Antimicrobials/antifungal:  Diflucan   Subjective: Significantly weak and deconditioned; having trouble tolerating dysphagia 1 nectar thick liquid diet.  Positive thrush appreciated on exam.  No nausea, no vomiting, no chest pain.  Objective: Vitals:   03/08/21 1446 03/08/21 1600 03/09/21 0419 03/09/21 1418  BP: 116/71  139/83 131/74  Pulse: 72  79 78  Resp: 18  17   Temp: 97.8  F (36.6 C)  98 F (36.7 C) (!) 97.4 F (36.3 C)  TempSrc: Oral   Oral  SpO2: 94%  (!) 84% 91%  Weight:  49.7 kg    Height:  6' (1.829 m)      Intake/Output  Summary (Last 24 hours) at 03/09/2021 1532 Last data filed at 03/09/2021 1505 Gross per 24 hour  Intake 3537.79 ml  Output 500 ml  Net 3037.79 ml   Filed Weights   03/08/21 1600  Weight: 49.7 kg    Examination: General exam: Chronically ill in appearance, complaining of intermittent  Generalized body pain, cachetic; having difficulties swallowing. Some improvement in his thrush appreciated.  Respiratory system: positive rhonchi bilaterally, no wheezing, no crackles.  Cardiovascular system:RRR. No murmurs, rubs, gallops. No JVD Gastrointestinal system: Abdomen is nondistended, soft and nontender. No organomegaly or masses felt. Positive BS, PEG tube in place. Central nervous system: Alert and oriented. No focal neurological deficits. Extremities: No cyanosis, no clubbing.  Skin: No petechiae. Left cheek/mandible lesion, no active drainage. Psychiatry: Mood & affect appropriate.    Data Reviewed: I have personally reviewed following labs and denies studies  CBC: Recent Labs  Lab 2021-03-11 1630 03/08/21 0437  WBC 20.0* 18.9*  NEUTROABS 18.7*  --   HGB 11.0* 10.7*  HCT 34.9* 33.5*  MCV 94.1 93.6  PLT 402* 389    Basic Metabolic Panel: Recent Labs  Lab 03-11-21 1630 03/08/21 0437 03/09/21 0543  NA 141 142 141  K 3.6 3.3* 3.6  CL 103 106 108  CO2 28 26 26   GLUCOSE 148* 84 116*  BUN 28* 21 23  CREATININE 0.57* 0.40* 0.36*  CALCIUM 9.2 9.0 8.9  MG  --  1.7 1.6*  PHOS  --  2.4* 2.8    GFR: Estimated Creatinine Clearance: 60.4 mL/min (A) (by C-G formula based on SCr of 0.36 mg/dL (L)).  Liver Function Tests: Recent Labs  Lab 2021/03/11 1630 03/08/21 0437 03/09/21 0543  AST 40 29 31  ALT 33 28 30  ALKPHOS 166* 143* 161*  BILITOT 0.7 0.7 0.5  PROT 5.7* 5.4* 5.1*  ALBUMIN 2.6* 2.5* 2.4*    CBG: Recent Labs  Lab 03/08/21 1111 03/08/21 1627 03/08/21 2048 03/09/21 0747 03/09/21 1122  GLUCAP 78 96 113* 114* 141*     Recent Results (from the past 240  hour(s))  Resp Panel by RT-PCR (Flu A&B, Covid) Nasopharyngeal Swab     Status: None   Collection Time: 2021-03-11  4:30 PM   Specimen: Nasopharyngeal Swab; Nasopharyngeal(NP) swabs in vial transport medium  Result Value Ref Range Status   SARS Coronavirus 2 by RT PCR NEGATIVE NEGATIVE Final    Comment: (NOTE) SARS-CoV-2 target nucleic acids are NOT DETECTED.  The SARS-CoV-2 RNA is generally detectable in upper respiratory specimens during the acute phase of infection. The lowest concentration of SARS-CoV-2 viral copies this assay can detect is 138 copies/mL. A negative result does not preclude SARS-Cov-2 infection and should not be used as the sole basis for treatment or other patient management decisions. A negative result may occur with  improper specimen collection/handling, submission of specimen other than nasopharyngeal swab, presence of viral mutation(s) within the areas targeted by this assay, and inadequate number of viral copies(<138 copies/mL). A negative result must be combined with clinical observations, patient history, and epidemiological information. The expected result is Negative.  Fact Sheet for Patients:  BloggerCourse.com  Fact Sheet for Healthcare Providers:  SeriousBroker.it  This test is no t yet approved or  cleared by the [place] and  has been authorized for detection and/or diagnosis of SARS-CoV-2 by FDA under an Emergency Use Authorization (EUA). This EUA will remain  in effect (meaning this test can be used) for the duration of the COVID-19 declaration under Section 564(b)(1) of the Act, 21 U.S.C.section 360bbb-3(b)(1), unless the authorization is terminated  or revoked sooner.       Influenza A by PCR NEGATIVE NEGATIVE Final   Influenza B by PCR NEGATIVE NEGATIVE Final    Comment: (NOTE) The Xpert Xpress SARS-CoV-2/FLU/RSV plus assay is intended as an aid in the diagnosis of influenza from  Nasopharyngeal swab specimens and should not be used as a sole basis for treatment. Nasal washings and aspirates are unacceptable for Xpert Xpress SARS-CoV-2/FLU/RSV testing.  Fact Sheet for Patients: BloggerCourse.com  Fact Sheet for Healthcare Providers: SeriousBroker.it  This test is not yet approved or cleared by the [place] FDA and has been authorized for detection and/or diagnosis of SARS-CoV-2 by FDA under an Emergency Use Authorization (EUA). This EUA will remain in effect (meaning this test can be used) for the duration of the COVID-19 declaration under Section 564(b)(1) of the Act, 21 U.S.C. section 360bbb-3(b)(1), unless the authorization is terminated or revoked.  Performed at [facility], [address], [place], [place] [address]   Blood culture (routine x 2)     Status: None (Preliminary result)   Collection Time: 03/20/2021  6:39 PM   Specimen: BLOOD LEFT HAND  Result Value Ref Range Status   Specimen Description   Final    BLOOD LEFT HAND Performed at [facility], [address] [place], [place] [address]    Special Requests   Final    BOTTLES DRAWN AEROBIC AND ANAEROBIC Blood Culture adequate volume Performed at [facility], [address], [place], [place] [address]    Culture   Final    NO GROWTH 2 DAYS Performed at [facility], [address] [place], [place] [address]    Report Status PENDING  Incomplete  Blood culture (routine x 2)     Status: None (Preliminary result)   Collection Time: 03/20/2021  6:42 PM   Specimen: BLOOD LEFT HAND  Result Value Ref Range Status   Specimen Description   Final    BLOOD LEFT HAND Performed at [facility], [address] [place], [place] [address]    Special Requests   Final    BOTTLES DRAWN AEROBIC ONLY Blood Culture adequate volume Performed at [facility], [address], [place], [place] [address]    Culture   Final    NO  GROWTH 2 DAYS Performed at [facility], [address] [place], [place] [address]    Report Status PENDING  Incomplete     Radiology Studies: CT Chest W Contrast  Result Date: 2021-03-20 CLINICAL DATA:  Abnormal x-ray.  Lung nodule. EXAM: CT CHEST WITH CONTRAST TECHNIQUE: Multidetector CT imaging of the chest was performed during intravenous contrast administration. CONTRAST:  75mL OMNIPAQUE IOHEXOL 300 MG/ML  SOLN COMPARISON:  March 19, 2020 FINDINGS: Cardiovascular: Normal heart size. No pericardial effusion. Mild aneurysmal dilation of the ascending aorta measuring 4 cm in cross-section. Calcific atherosclerotic disease of the aorta and coronary arteries. Mediastinum/Nodes: Right pericardial lymph node measuring 1.6 cm in short axis. Mild diffuse thickening of the esophagus. The major airways are patent. Lungs/Pleura: Too numerous to count bilateral ground-glass, solid and mixed attenuation nodules with lower lobe predominance. Some of the lesions  demonstrate cavitations. He dominant such nodule in the posterior left lower lobe measures 2.7 cm. A dominant solid nodule in the right lower lobe measures 2.7 cm. No evidence of airspace consolidation pleural effusion or pneumothorax. Upper Abdomen: Numerous, some confluent hypoattenuated masses throughout the visualized portion of the liver highly suspicious for metastatic disease. Individual lesions measure up to 3 cm. Bilateral adrenal masses, the larger on the left measuring 3.7 cm. Musculoskeletal: Fracture of posterior right T11 rib, likely pathologic. Lytic lesions within the T4 vertebral body, T6 vertebral body, and T8 vertebral body, likely metastatic. Other more subtle lytic lesions within thoracic vertebral bodies. Some of the lesions demonstrate perispinal soft tissue component. IMPRESSION: 1. Too numerous to count bilateral ground-glass, solid and mixed attenuation pulmonary nodules with lower lobe predominance. Some of the lesions demonstrate  cavitations. These findings are most consistent with metastatic disease. 2. Numerous, some confluent hypoattenuated masses throughout the visualized portion of the liver highly suspicious for metastatic disease. 3. Bilateral adrenal masses, the larger on the left measuring 3.7 cm, also consistent with metastatic disease. 4. Lytic lesions within the T4, T6 and T8 vertebral bodies, likely metastatic. Some of the lesions demonstrate perispinal soft tissue component. 5. Fracture of posterior right T11 rib, likely pathologic. 6. Mild aneurysmal dilation of the ascending aorta measuring 4 cm in cross-section. 7. Calcific atherosclerotic disease of the aorta and coronary arteries. Aortic Atherosclerosis (ICD10-I70.0). Electronically Signed   By: Ted Mcalpine M.D.   On: 03/05/2021 20:40   DG Chest Port 1 View  Result Date: 02/23/2021 CLINICAL DATA:  Failure to thrive, weakness, history of squamous cell carcinoma of the floor of the mouth EXAM: PORTABLE CHEST 1 VIEW COMPARISON:  05/16/2019, 03/19/2020 . FINDINGS: Single frontal view of the chest demonstrates a stable right chest wall port. Cardiac silhouette is unremarkable. Nodular bibasilar airspace disease greatest in the left lower lobe, which could reflect pneumonia or progressive malignancy. Spiculated area left upper lobe seen on prior PET scan overlying the left posterior sixth rib does not appear grossly changed. No effusion or pneumothorax. No acute bony abnormalities. Peg tube overlies left upper quadrant. IMPRESSION: 1. Bibasilar nodular airspace disease greatest in the left lower lobe, which may reflect pneumonia or progressive malignancy. 2. Stable spiculated density within the left upper lobe unchanged. This area was equivocal for malignancy on prior PET scan. 3. The metabolically active left upper lobe pulmonary nodule seen on prior PET scan is not readily apparent by radiograph. Electronically Signed   By: Sharlet Salina M.D.   On: 02/23/2021  16:57    Scheduled Meds: . Chlorhexidine Gluconate Cloth  6 each Topical Daily  . [START ON 03/10/2021] enoxaparin (LOVENOX) injection  40 mg Subcutaneous Q24H  . feeding supplement (OSMOLITE 1.5 CAL)  1,000 mL Per Tube Q24H  . feeding supplement (PROSource TF)  90 mL Per Tube QID  . gabapentin  800 mg Oral TID  . insulin aspart  0-6 Units Subcutaneous TID WC  . nystatin  5 mL Mouth/Throat QID  . oxyCODONE  10 mg Oral Q12H  . pantoprazole  40 mg Oral Daily   Continuous Infusions: . 0.9 % NaCl with KCl 20 mEq / L 75 mL/hr at 03/09/21 1425  . fluconazole (DIFLUCAN) IV 200 mg (03/08/21 1712)     LOS: 1 day    Time spent: 30 minutes.    Vassie Loll, MD [facility]   To contact the attending provider between 7A-7P or the covering provider during after hours 7P-7A, please  log into the web site [email or web address] and access using universal [place] password for that web site. If you do not have the password, please call the hospital operator.  03/09/2021, 3:32 PM
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
## 2021-03-10 DIAGNOSIS — J449 Chronic obstructive pulmonary disease, unspecified: Secondary | ICD-10-CM | POA: Diagnosis not present

## 2021-03-10 DIAGNOSIS — R627 Adult failure to thrive: Secondary | ICD-10-CM | POA: Diagnosis not present

## 2021-03-10 DIAGNOSIS — C069 Malignant neoplasm of mouth, unspecified: Secondary | ICD-10-CM | POA: Diagnosis not present

## 2021-03-10 DIAGNOSIS — E860 Dehydration: Secondary | ICD-10-CM | POA: Diagnosis not present

## 2021-03-10 LAB — GLUCOSE, CAPILLARY
Glucose-Capillary: 126 mg/dL — ABNORMAL HIGH (ref 70–99)
Glucose-Capillary: 95 mg/dL (ref 70–99)
Glucose-Capillary: 127 mg/dL — ABNORMAL HIGH (ref 70–99)
Glucose-Capillary: 130 mg/dL — ABNORMAL HIGH (ref 70–99)

## 2021-03-10 MED ORDER — PROSOURCE TF PO LIQD
90.0000 mL | Freq: Three times a day (TID) | ORAL | Status: DC
  Administered 2021-03-11 – 2021-03-14 (×10): 90 mL
  Filled 2021-03-10 (×9): qty 90

## 2021-03-10 MED ORDER — MAGNESIUM OXIDE 400 (241.3 MG) MG PO TABS
400.0000 mg | ORAL_TABLET | Freq: Every day | ORAL | Status: DC
  Administered 2021-03-10: 400 mg via ORAL

## 2021-03-10 MED ORDER — GABAPENTIN 250 MG/5ML PO SOLN
800.0000 mg | Freq: Three times a day (TID) | ORAL | Status: DC
  Administered 2021-03-11 – 2021-03-14 (×10): 800 mg
  Filled 2021-03-10: qty 18
  Filled 2021-03-10 (×13): qty 16
  Filled 2021-03-10: qty 18
  Filled 2021-03-10: qty 16

## 2021-03-10 MED ORDER — OSMOLITE 1.5 CAL PO LIQD
1000.0000 mL | ORAL | Status: DC
  Administered 2021-03-10 – 2021-03-13 (×4): 1000 mL

## 2021-03-10 MED ORDER — OXYCODONE HCL 5 MG PO TABS
5.0000 mg | ORAL_TABLET | Freq: Four times a day (QID) | ORAL | Status: DC
  Administered 2021-03-10 – 2021-03-14 (×13): 5 mg
  Filled 2021-03-10 (×15): qty 1

## 2021-03-10 MED ORDER — GABAPENTIN 400 MG PO CAPS
800.0000 mg | ORAL_CAPSULE | Freq: Three times a day (TID) | ORAL | Status: DC
  Administered 2021-03-10: 800 mg
  Filled 2021-03-10 (×2): qty 2

## 2021-03-10 MED ORDER — PANTOPRAZOLE SODIUM 40 MG PO PACK
40.0000 mg | PACK | Freq: Every day | ORAL | Status: DC
  Administered 2021-03-11 – 2021-03-14 (×4): 40 mg
  Filled 2021-03-10 (×4): qty 20

## 2021-03-10 MED ORDER — MAGNESIUM OXIDE 400 (241.3 MG) MG PO TABS
400.0000 mg | ORAL_TABLET | Freq: Every day | ORAL | Status: DC
  Administered 2021-03-11 – 2021-03-14 (×4): 400 mg
  Filled 2021-03-10 (×5): qty 1

## 2021-03-10 NOTE — Progress Notes (Addendum)
Nutrition Follow up  DOCUMENTATION CODES:   Severe malnutrition in context of chronic illness,Underweight  INTERVENTION: Increase  Osmolite 1.5 @ 40 ml/hr via PEG- provides 1440 kcal, 60 gr protein, 731 ml water.  ProSource TF 90 ml- TID per tube -supplies 66 gr protein, 240 kcal  Continue daily weights   Recommend bowel regimen- Last BM prior to admission  NUTRITION DIAGNOSIS:   Severe Malnutrition related to catabolic illness,social / environmental circumstances as evidenced by  severe fat depletion,severe muscle depletion (metastatic cancer, lives alone (caregiver passed away months ago)) FTT. Patient has not been using PEG tube and limited intake of po orally.   GOAL:  Patient will meet greater than or equal to 90% of their needs if feasible given his cancer diagnosis. Palliative consult pending to discuss [place].  MONITOR:  TF tolerance,Weight trends,Skin,I & O's,Labs,PO intake   ASSESSMENT: Patient is a 70 yo with a hx of COPD, DM, HTN, cancer of the mouth and lung. He has a PEG tube (not using) and is able to take purreed foods. Failure to thrive, severe malnutrition -metastatic cancer.   Patient was able to take some lunch 65% documented. He has not been using PEG at home. Limited diet hx recall. Glucerna 1.5 (7 cartons daily) per medication history.  Discussed with MD- will substitute Osmolite 1.5 and start at a low rate and gradually advance due to his refeeding risk. Patient is tolerating tube feeds will advance rate today. Discussed with nursing.   4/3-Patient has oral thrush. Speech therapy evaluated 4/2- recommendation for NPO.  Patient weight has decreased 15.7 kg (24%) compared to 11 months ago which is significant.  Medications: insulin, protonix, dilaudid and magic mouthwash  IV-NS/ KCL @ 75 ml/hr   Labs reviewed: potassium 3.6, Phos 2.8, Mag 1.6, Albumin 2.4 (L) WBC-18.9 (H)     Diet Order:   Diet Order            Diet NPO time specified  Diet effective  now                 EDUCATION NEEDS:  Not appropriate for education at this time  Skin:  Skin Assessment: Skin Integrity Issues: Skin Integrity Issues:: Stage III Stage III: sacrum  Last BM:  prior to admission  Height:   Ht Readings from Last 1 Encounters:  03/08/21 6' (1.829 m)    Weight:   Wt Readings from Last 1 Encounters:  03/08/21 49.7 kg  04/05/20 65.4 kg  Ideal Body Weight:   81 kg  BMI:  Body mass index is 14.86 kg/m.  Estimated Nutritional Needs:   Kcal:  1950-2200  Protein:  111-124 gr  Fluid:  2 liters daily  Colman Cater MS,RD,CSG,LDN Pager: Shea Evans

## 2021-03-10 NOTE — Progress Notes (Signed)
Patient had friend call to staff phone to inquire about patient.  Friend stated she had called previously and wanted to be placed on patient information list so she could receive information about patient.  Patient friend stated that the family was not sharing information with her.  Explained to patient friend that it was  HIPAA violation, and that we could not give patient information to her over the phone, although if she wanted to speak with patient she could.  Explained to patient friend that patient was alert and oriented and could voice needs, so staff was not obligated to give her information because he could voice them himself. Paitent friend was audibly irritated with staff conversation. Phone given to patient, and patient spoke with friend. No information given to this person.

## 2021-03-10 NOTE — Progress Notes (Signed)
PROGRESS NOTE    Lance [name]  WJX:[id number] DOB: Dec 16, 1950 DOA: 01-Apr-2021 PCP: [name], MD   Chief Complaint  Patient presents with  . Failure To Thrive    Brief admission narrative:  As per H&P written by Dr. Randol Kern on 2021-04-01 Lance [name]  is a 70 y.o. 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, and non-small cell lung cancer, status post chemo and radiation therapy, most recently patient was seen by his oncologist in April 2021, patient failed to show up on his follow-up appointment(despite receiving multiple voicemail as noted on epic), patient has PEG tube, apparently his caregiver (girlfriend) passed away last 09/22/2023, patient was brought by her sister for failure to thrive, patient reports has been having cough for few days, has been feeling dyspneic as well, reports he is having very poor oral intake, usually he is on pured diet, drinking little fluids as well, he has a PEG tube, but reports he has not used it for a while, he denies fever, chills, chest pain. - in ED work-up was significant for albumin of 2.6, white blood cell count of 20K, for widely metastatic disease with multiple bilateral nodules, liver metastasis, abdominal metastasis, and lytic lesions in vertebral bodies,[facility] hospitalist consulted to admit.  Assessment & Plan: 1-Oropharyngeal cancer [facility]) -Patient receiving radiotherapy and chemotherapy; has ended losing follow-up and at this time presenting with overall worsening condition failure to thrive. -Patient understands there is no acute and is looking to see improvement of overall symptoms management, hydration and nutrition. -Pending further recommendations by oncology service -Palliative also consulted.  2-Oral thrush -Will continue treatment with nystatin and Diflucan -continue to Follow response  3-dysphagia -Continue maintain hydration and nutrition through PEG tube at this point  -Dietitian consultation  appreciated  -Speech therapy also asked to see patient, will do MBS -will keep NPO currently  4-COPD (chronic obstructive pulmonary disease) (HCC) -No wheezing and no using accessory muscles -Continue bronchodilator management as neede.  5-chronic pain syndrome -Continue PRN narcotics -having trouble with extended release option while unable to give meds orally; will make immediate release narcotic available more frequent.  6-type 2 diabetes -Continue sliding scale insulin with close CBGs hematology -continue Holding oral hypoglycemic agents while inpatient.  7-hypertension -Vital signs stable -Holding oral antihypertensive agents in the setting of dehydration and failure to thrive. -follow BP  8-failure to thrive/severe protein continue nutrition -As mentioned above initiate treatment with tube feedings through PEG tube -Follow electrolytes for refeeding syndrome  DVT prophylaxis: Lovenox Code Status: DNR Family Communication: No family at bedside. Disposition:   Status is: Inpatient  Dispo: The patient is from: Home              Anticipated d/c is to: To be determined              Patient currently is not medically stable for discharge; still having difficulty with swallowing and initiating PEG tube feedings; close monitoring to minimize the chances for refeeding syndrome.   Difficult to place patient : No    Consultants:  Palliative care Dietitian service   Procedures:  See below for x-ray reports   Antimicrobials/antifungal:  Diflucan   Subjective: No chest pain, no nausea, no vomiting.  Intermittent coughing spells reported while trying to eat or drink yesterday morning: Has been n.p.o. since evaluation by speech therapy.  Tolerating feeding tubes.  Feeling weak, deconditioned and is chronically ill in appearance and cachetic.  Objective: Vitals:  03/09/21 0419 03/09/21 1418 03/09/21 2054 03/10/21 0309  BP: 139/83 131/74 140/77 133/81  Pulse: 79 78 68  72  Resp: 17  18   Temp: 98 F (36.7 C) (!) 97.4 F (36.3 C) 98 F (36.7 C) (!) 97 F (36.1 C)  TempSrc:  Oral    SpO2: (!) 84% 91% 95% 96%  Weight:      Height:        Intake/Output Summary (Last 24 hours) at 03/10/2021 1214 Last data filed at 03/10/2021 0900 Gross per 24 hour  Intake 4494.62 ml  Output 550 ml  Net 3944.62 ml   Filed Weights   03/08/21 1600  Weight: 49.7 kg    Examination: General exam: Alert, awake, oriented x 3; cachectic, chronically ill in appearance and expressing intermittent generalized body aches.  Patient has been intermittent coughing spells while trying dysphagia 1 diet and currently n.p.o. Respiratory system: Bilateral rhonchi appreciated; no wheezing, no crackles.  No using accessory muscles. Cardiovascular system: Right port a cath in place (no accessed); RRR, no rubs, no gallops, no JVD on exam. Gastrointestinal system: Abdomen is nondistended, soft and nontender. No organomegaly or masses felt. Normal bowel sounds heard.  PEG tube in place. Central nervous system: Alert and oriented. No focal neurological deficits. Extremities: No cyanosis or clubbing. Skin: No petechiae. Psychiatry: Judgement and insight appear normal. Mood & affect appropriate.    Data Reviewed: I have personally reviewed following labs and denies studies  CBC: Recent Labs  Lab 20-Mar-2021 1630 03/08/21 0437  WBC 20.0* 18.9*  NEUTROABS 18.7*  --   HGB 11.0* 10.7*  HCT 34.9* 33.5*  MCV 94.1 93.6  PLT 402* 389    Basic Metabolic Panel: Recent Labs  Lab 2021/03/20 1630 03/08/21 0437 03/09/21 0543  NA 141 142 141  K 3.6 3.3* 3.6  CL 103 106 108  CO2 28 26 26   GLUCOSE 148* 84 116*  BUN 28* 21 23  CREATININE 0.57* 0.40* 0.36*  CALCIUM 9.2 9.0 8.9  MG  --  1.7 1.6*  PHOS  --  2.4* 2.8    GFR: Estimated Creatinine Clearance: 60.4 mL/min (A) (by C-G formula based on SCr of 0.36 mg/dL (L)).  Liver Function Tests: Recent Labs  Lab 20-Mar-2021 1630 03/08/21 0437  03/09/21 0543  AST 40 29 31  ALT 33 28 30  ALKPHOS 166* 143* 161*  BILITOT 0.7 0.7 0.5  PROT 5.7* 5.4* 5.1*  ALBUMIN 2.6* 2.5* 2.4*    CBG: Recent Labs  Lab 03/09/21 1122 03/09/21 1619 03/09/21 2056 03/10/21 0729 03/10/21 1110  GLUCAP 141* 131* 144* 126* 95     Recent Results (from the past 240 hour(s))  Resp Panel by RT-PCR (Flu A&B, Covid) Nasopharyngeal Swab     Status: None   Collection Time: 03/20/2021  4:30 PM   Specimen: Nasopharyngeal Swab; Nasopharyngeal(NP) swabs in vial transport medium  Result Value Ref Range Status   SARS Coronavirus 2 by RT PCR NEGATIVE NEGATIVE Final    Comment: (NOTE) SARS-CoV-2 target nucleic acids are NOT DETECTED.  The SARS-CoV-2 RNA is generally detectable in upper respiratory specimens during the acute phase of infection. The lowest concentration of SARS-CoV-2 viral copies this assay can detect is 138 copies/mL. A negative result does not preclude SARS-Cov-2 infection and should not be used as the sole basis for treatment or other patient management decisions. A negative result may occur with  improper specimen collection/handling, submission of specimen other than nasopharyngeal swab, presence of viral mutation(s) within the  areas targeted by this assay, and inadequate number of viral copies(<138 copies/mL). A negative result must be combined with clinical observations, patient history, and epidemiological information. The expected result is Negative.  Fact Sheet for Patients:  BloggerCourse.com  Fact Sheet for Healthcare Providers:  SeriousBroker.it  This test is no t yet approved or cleared by the [place] FDA and  has been authorized for detection and/or diagnosis of SARS-CoV-2 by FDA under an Emergency Use Authorization (EUA). This EUA will remain  in effect (meaning this test can be used) for the duration of the COVID-19 declaration under Section 564(b)(1) of the Act,  21 U.S.C.section 360bbb-3(b)(1), unless the authorization is terminated  or revoked sooner.       Influenza A by PCR NEGATIVE NEGATIVE Final   Influenza B by PCR NEGATIVE NEGATIVE Final    Comment: (NOTE) The Xpert Xpress SARS-CoV-2/FLU/RSV plus assay is intended as an aid in the diagnosis of influenza from Nasopharyngeal swab specimens and should not be used as a sole basis for treatment. Nasal washings and aspirates are unacceptable for Xpert Xpress SARS-CoV-2/FLU/RSV testing.  Fact Sheet for Patients: BloggerCourse.com  Fact Sheet for Healthcare Providers: SeriousBroker.it  This test is not yet approved or cleared by the [place] FDA and has been authorized for detection and/or diagnosis of SARS-CoV-2 by FDA under an Emergency Use Authorization (EUA). This EUA will remain in effect (meaning this test can be used) for the duration of the COVID-19 declaration under Section 564(b)(1) of the Act, 21 U.S.C. section 360bbb-3(b)(1), unless the authorization is terminated or revoked.  Performed at [facility], [address], [place], [place] [address]   Blood culture (routine x 2)     Status: None (Preliminary result)   Collection Time: 2021/04/02  6:39 PM   Specimen: BLOOD LEFT HAND  Result Value Ref Range Status   Specimen Description   Final    BLOOD LEFT HAND Performed at [facility], [address], [place], [place] [address]    Special Requests   Final    BOTTLES DRAWN AEROBIC AND ANAEROBIC Blood Culture adequate volume Performed at [facility], [address] [place], [place] [address]    Culture   Final    NO GROWTH 2 DAYS Performed at [facility], [address], [place], [place] [address]    Report Status PENDING  Incomplete  Blood culture (routine x 2)     Status: None (Preliminary result)   Collection Time: Apr 02, 2021  6:42 PM   Specimen: BLOOD LEFT HAND  Result Value Ref Range Status    Specimen Description   Final    BLOOD LEFT HAND Performed at [facility], [address], [place], [place] [address]    Special Requests   Final    BOTTLES DRAWN AEROBIC ONLY Blood Culture adequate volume Performed at [facility], [address] [place], [place] [address]    Culture   Final    NO GROWTH 2 DAYS Performed at [facility], [address] [place], [place] [address]    Report Status PENDING  Incomplete     Radiology Studies: No results found.  Scheduled Meds: . Chlorhexidine Gluconate Cloth  6 each Topical Daily  . enoxaparin (LOVENOX) injection  40 mg Subcutaneous Q24H  . feeding supplement (OSMOLITE 1.5 CAL)  1,000 mL Per Tube Q24H  . feeding supplement (PROSource TF)  90 mL Per Tube QID  . gabapentin  800 mg Per Tube TID  . insulin aspart  0-6 Units Subcutaneous TID WC  . magnesium oxide  400 mg Oral Daily  . nystatin  5 mL Mouth/Throat QID  . oxyCODONE  5 mg Per Tube Q6H  . pantoprazole sodium  40 mg Per Tube Daily   Continuous Infusions: . 0.9 % NaCl with KCl 20 mEq / L 75 mL/hr at 03/09/21 1425  . fluconazole (DIFLUCAN) IV 200 mg (03/09/21 1640)     LOS: 2 days    Time spent: 30 minutes.    Vassie Loll, MD [facility]   To contact the attending provider between 7A-7P or the covering provider during after hours 7P-7A, please log into the web site [email or web address] and access using universal [place] password for that web site. If you do not have the password, please call the hospital operator.  03/10/2021, 12:14 PM

## 2021-03-11 DIAGNOSIS — C109 Malignant neoplasm of oropharynx, unspecified: Principal | ICD-10-CM

## 2021-03-11 DIAGNOSIS — E43 Unspecified severe protein-calorie malnutrition: Secondary | ICD-10-CM | POA: Diagnosis not present

## 2021-03-11 DIAGNOSIS — C787 Secondary malignant neoplasm of liver and intrahepatic bile duct: Secondary | ICD-10-CM

## 2021-03-11 DIAGNOSIS — C069 Malignant neoplasm of mouth, unspecified: Secondary | ICD-10-CM | POA: Diagnosis not present

## 2021-03-11 DIAGNOSIS — E860 Dehydration: Secondary | ICD-10-CM | POA: Diagnosis not present

## 2021-03-11 DIAGNOSIS — R627 Adult failure to thrive: Secondary | ICD-10-CM

## 2021-03-11 DIAGNOSIS — J449 Chronic obstructive pulmonary disease, unspecified: Secondary | ICD-10-CM | POA: Diagnosis not present

## 2021-03-11 DIAGNOSIS — C3412 Malignant neoplasm of upper lobe, left bronchus or lung: Secondary | ICD-10-CM

## 2021-03-11 LAB — CBC
HCT: 33.4 % — ABNORMAL LOW (ref 39.0–52.0)
Hemoglobin: 10.1 g/dL — ABNORMAL LOW (ref 13.0–17.0)
MCH: 29.2 pg (ref 26.0–34.0)
MCHC: 30.2 g/dL (ref 30.0–36.0)
MCV: 96.5 fL (ref 80.0–100.0)
Platelets: 327 10*3/uL (ref 150–400)
RBC: 3.46 MIL/uL — ABNORMAL LOW (ref 4.22–5.81)
RDW: 14.1 % (ref 11.5–15.5)
WBC: 18.7 10*3/uL — ABNORMAL HIGH (ref 4.0–10.5)
nRBC: 0 % (ref 0.0–0.2)

## 2021-03-11 LAB — GLUCOSE, CAPILLARY
Glucose-Capillary: 125 mg/dL — ABNORMAL HIGH (ref 70–99)
Glucose-Capillary: 192 mg/dL — ABNORMAL HIGH (ref 70–99)
Glucose-Capillary: 120 mg/dL — ABNORMAL HIGH (ref 70–99)
Glucose-Capillary: 160 mg/dL — ABNORMAL HIGH (ref 70–99)

## 2021-03-11 LAB — BASIC METABOLIC PANEL
Anion gap: 9 (ref 5–15)
BUN: 20 mg/dL (ref 8–23)
CO2: 26 mmol/L (ref 22–32)
Calcium: 8.6 mg/dL — ABNORMAL LOW (ref 8.9–10.3)
Chloride: 108 mmol/L (ref 98–111)
Creatinine, Ser: 0.33 mg/dL — ABNORMAL LOW (ref 0.61–1.24)
GFR, Estimated: >60 mL/min (ref >60)
Glucose, Bld: 148 mg/dL — ABNORMAL HIGH (ref 70–99)
Potassium: 3.3 mmol/L — ABNORMAL LOW (ref 3.5–5.1)
Sodium: 143 mmol/L (ref 135–145)

## 2021-03-11 LAB — MAGNESIUM: Magnesium: 1.6 mg/dL — ABNORMAL LOW (ref 1.7–2.4)

## 2021-03-11 LAB — PHOSPHORUS: Phosphorus: 1.8 mg/dL — ABNORMAL LOW (ref 2.5–4.6)

## 2021-03-11 NOTE — Progress Notes (Signed)
Speech Language Pathology Treatment: Dysphagia  Patient Details Name: Lance [name] MRN: [id number] DOB: [birth year] Today's Date: 03/11/2021 Time: 4098-1191 SLP Time Calculation (min) (ACUTE ONLY): 25 min  Assessment / Plan / Recommendation Clinical Impression  Pt seen at bedside to discuss taking him down for MBSS. Pt initially refused and stated he wanted to only do the test in his room. SLP provided rationale for completing the test. Pt indicated that he wanted to drink [place]. SLP explained that it was highly likely that Pt was aspirating foods/liquids given his presentation on Saturday. SLP spoke with Pt and his sister who both indicated that Pt is likely aspirating, but that he also wants to be able to have po for his comfort/pleasure. Pt agreeable to po trials again in room and was assessed with ice chips, thin water, [place] ice, NTL, and applesauce. Pt with only minimal throat clearing after ice chip trials. Throat clearing and coughing present with additional trials (water, NTL, puree, etc), however Pt stated that he wanted to have the option of taking small amounts of foods/liquids despite coughing and aspiration risks. Both he and his sister stated that he would like to receive puree/thin tray. The oncologist has not yet seen Pt and palliative is planning on seeing Pt tomorrow. Pt does not want to pursue MBSS at this time and I do not know if it would change his plan of care. Messaged MD and ok to start puree and thin and SLP will follow; can complete MBSS if Pt decides and pending goals of care.   HPI HPI: Lance [name]  is a 70 y.o. 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 He reports not using the PEG tube for a long time. He underwent a composite resection of the floor of mouth with a left segmental mandibulectomy and free flap reconstruction on 02/25/19 and was then started on a course of post-op radiation with concurrent weekly cisplatin. Mr. Sibayan had a very difficult time with this treatment due to oral cavity and oropharyngeal pain. The only ST hx found in care everywhere indicates 2 attempts to complete instrumental testing and Pt did not show up for either appointment for MBS.      SLP Plan  Continue with current plan of care       Recommendations  Diet recommendations: Dysphagia 1 (puree);Thin liquid Liquids provided via: Teaspoon;Cup Medication Administration: Via alternative means Supervision: Patient able to self feed;Full supervision/cueing for compensatory strategies Compensations: Slow rate;Small sips/bites;Multiple dry swallows after each bite/sip Postural Changes and/or Swallow Maneuvers: Seated upright 90 degrees;Upright 30-60 min after meal                Oral Care Recommendations: Oral care before and after PO;Staff/trained caregiver to provide oral care Follow up Recommendations: 24 hour supervision/assistance SLP Visit Diagnosis: Dysphagia, oropharyngeal phase (R13.12) Plan: Continue with current plan of care       Thank you,  Havery Moros, CCC-SLP [phone]                 Andreyah Natividad 03/11/2021, 1:26 PM

## 2021-03-11 NOTE — Progress Notes (Addendum)
Nutrition Follow up  DOCUMENTATION CODES:   Severe malnutrition in context of chronic illness,Underweight  INTERVENTION: Osmolite 1.5 @ 40 ml/hr via PEG- provides 1440 kcal, 60 gr protein, 731 ml water.  ProSource TF 90 ml- TID per tube -supplies 66 gr protein, 240 kcal  Continue daily weights   Recommend bowel regimen- Last BM prior to admission  Replete mag and phos per MD  NUTRITION DIAGNOSIS:   Severe Malnutrition related to catabolic illness,social / environmental circumstances as evidenced by  severe fat depletion,severe muscle depletion (metastatic cancer, lives alone (caregiver passed away months ago)) FTT. Patient has not been using PEG tube and limited intake of po orally.   GOAL:  Patient will meet greater than or equal to 90% of their needs if feasible given his cancer diagnosis. Palliative consult pending to discuss [place].  MONITOR:  TF tolerance,Weight trends,Skin,I & O's,Labs,PO intake   ASSESSMENT: Patient is a 70 yo with a hx of COPD, DM, HTN, cancer of the mouth and lung. He has a PEG tube (not using) and is able to take purreed foods. Failure to thrive, severe malnutrition -metastatic cancer.   Patient was able to take some lunch 65% documented. He has not been using PEG at home. Limited diet hx recall. Glucerna 1.5 (7 cartons daily) per medication history.  Discussed with MD- will substitute Osmolite 1.5 and start at a low rate and gradually advance due to his refeeding risk. Patient is tolerating tube feeds will advance rate today. Discussed with nursing.   4/3-Patient has oral thrush. Speech therapy evaluated 4/2- recommendation for NPO. Patient weight has decreased 15.7 kg (24%) compared to 11 months ago which is significant.  4/4- Speech Therapy advanced diet today and pt reports taking ice cream and [place] ice. He is able to feed himself. Palliative consult pending to discuss GOC-to return tomorrow per chart note. Discussed with RN and transition of care -  possible Hospice. Will continue with tube feedings at current rate as goals are being clarified.  Medications: insulin, protonix, dilaudid and magic mouthwash  IV-NS/ KCL @ 75 ml/hr   Labs reviewed: potassium 3.6, Phos 1.8 (L), Mag 1.6, Albumin 2.4 (L) WBC-18.9 (H)     Diet Order:   Diet Order            DIET - DYS 1 Room service appropriate? Yes; Fluid consistency: Thin  Diet effective now                 EDUCATION NEEDS:  Not appropriate for education at this time  Skin:  Skin Assessment: Skin Integrity Issues: Skin Integrity Issues:: Stage III Stage III: sacrum  Last BM:  prior to admission  Height:   Ht Readings from Last 1 Encounters:  03/08/21 6' (1.829 m)    Weight:   Wt Readings from Last 1 Encounters:  03/11/21 59.7 kg  04/05/20 65.4 kg  Ideal Body Weight:   81 kg  BMI:  Body mass index is 17.85 kg/m.  Estimated Nutritional Needs:   Kcal:  1950-2200  Protein:  111-124 gr  Fluid:  2 liters daily  Colman Cater MS,RD,CSG,LDN Pager: Shea Evans
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
## 2021-03-11 NOTE — Progress Notes (Signed)
PROGRESS NOTE    DRIN KISHIMOTO  GEX:[id number] DOB: 03-19-1951 DOA: 2021/03/27 PCP: [name], MD   Chief Complaint  Patient presents with  . Failure To Thrive    Brief admission narrative:  As per H&P written by Dr. Randol Kern on 03-27-21 Lance [name]  is a 70 y.o. 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, and non-small cell lung cancer, status post chemo and radiation therapy, most recently patient was seen by his oncologist in April 2021, patient failed to show up on his follow-up appointment(despite receiving multiple voicemail as noted on epic), patient has PEG tube, apparently his caregiver (girlfriend) passed away last 09-17-2023, patient was brought by her sister for failure to thrive, patient reports has been having cough for few days, has been feeling dyspneic as well, reports he is having very poor oral intake, usually he is on pured diet, drinking little fluids as well, he has a PEG tube, but reports he has not used it for a while, he denies fever, chills, chest pain. - in ED work-up was significant for albumin of 2.6, white blood cell count of 20K, for widely metastatic disease with multiple bilateral nodules, liver metastasis, abdominal metastasis, and lytic lesions in vertebral bodies,[facility] hospitalist consulted to admit.  Assessment & Plan: 1-Oropharyngeal cancer [facility]) -Patient receiving radiotherapy and chemotherapy; has ended losing follow-up and at this time presenting with overall worsening condition failure to thrive. -Patient understands there is no acute and is looking to see improvement of overall symptoms management, hydration and nutrition. -Pending further recommendations by oncology service -Palliative also consulted.  2-Oral thrush -Will continue treatment with nystatin and Diflucan -continue to Follow response  3-dysphagia -Continue maintain hydration and nutrition through PEG tube at this point  -Dietitian consultation  appreciated  -Speech therapy has seen patient and at this time he has declined MBS and is looking to take the risk of aspiration while-dysphagia 1 with thin liquids allowed.  4-COPD (chronic obstructive pulmonary disease) (HCC) -No wheezing and no using accessory muscles -Continue bronchodilator management as neede.  5-chronic pain syndrome -Continue PRN narcotics -having trouble with extended release option while unable to give meds orally; will make immediate release narcotic available more frequent.  6-type 2 diabetes -Continue sliding scale insulin with close CBGs hematology -continue Holding oral hypoglycemic agents while inpatient.  7-hypertension -Vital signs stable -Holding oral antihypertensive agents in the setting of dehydration and failure to thrive. -follow BP  8-failure to thrive/severe protein continue nutrition -As mentioned above initiate treatment with tube feedings through PEG tube -Follow electrolytes for refeeding syndrome  DVT prophylaxis: Lovenox Code Status: DNR Family Communication: No family at bedside. Disposition:   Status is: Inpatient  Dispo: The patient is from: Home              Anticipated d/c is to: To be determined              Patient currently is not medically stable for discharge; still having difficulty with swallowing and initiating PEG tube feedings; close monitoring to minimize the chances for refeeding syndrome.   Difficult to place patient : No    Consultants:  Palliative care Dietitian service   Procedures:  See below for x-ray reports   Antimicrobials/antifungal:  Diflucan   Subjective: Chronically ill in appearance and very cachectic; upset from being n.p.o.  Declining MBS and excepting risk of aspiration in exchange to comfort feeding.  Waiting to discuss with palliative care services and oncology service.  Tolerating PEG tube.  Objective: Vitals:   03/10/21 2055 03/11/21 0455 03/11/21 0600 03/11/21 1351  BP:  130/77 132/81  129/76  Pulse: 73 76  (!) 102  Resp: 14 16    Temp: 98.5 F (36.9 C) 99.2 F (37.3 C)  98.6 F (37 C)  TempSrc: Oral Oral  Oral  SpO2: 96% (!) 89%  95%  Weight:   59.7 kg   Height:        Intake/Output Summary (Last 24 hours) at 03/11/2021 1622 Last data filed at 03/11/2021 1300 Gross per 24 hour  Intake 120 ml  Output 1550 ml  Net -1430 ml   Filed Weights   03/08/21 1600 03/11/21 0600  Weight: 49.7 kg 59.7 kg    Examination: General exam: Alert, awake, oriented x 3; significantly cachectic and demonstrating chronic ill appearance.  Continues to have intermittent coughing spells chronically swollen bilateral protein clearance increase on secretions.  Patient expressed to be upset without allowing him to eat and with reasoning explained expressed that he preferred to take the chances of aspiration done no be able to eat at all.  He has declined MBS evaluation. Respiratory system: Positive rhonchi bilaterally; no using accessory muscle.  Good saturation on 2L. Cardiovascular system:RRR.  No rubs, no gallops, no JVD appreciated on exam. Gastrointestinal system: Abdomen is nondistended, soft and nontender. No organomegaly or masses felt. Normal bowel sounds heard.  PEG tube in place. Central nervous system: Alert and oriented. No focal neurological deficits. Extremities: No cyanosis or clubbing.  No edema. Skin: No rashes, no petechiae.  Right port a cath in place. Psychiatry: Judgement and insight appear normal. Mood & affect appropriate.    Data Reviewed: I have personally reviewed following labs and denies studies  CBC: Recent Labs  Lab 2021-03-09 1630 03/08/21 0437 03/11/21 0452  WBC 20.0* 18.9* 18.7*  NEUTROABS 18.7*  --   --   HGB 11.0* 10.7* 10.1*  HCT 34.9* 33.5* 33.4*  MCV 94.1 93.6 96.5  PLT 402* 389 327    Basic Metabolic Panel: Recent Labs  Lab 09-Mar-2021 1630 03/08/21 0437 03/09/21 0543 03/11/21 0452  NA 141 142 141 143  K 3.6 3.3* 3.6 3.3*   CL 103 106 108 108  CO2 28 26 26 26   GLUCOSE 148* 84 116* 148*  BUN 28* 21 23 20   CREATININE 0.57* 0.40* 0.36* 0.33*  CALCIUM 9.2 9.0 8.9 8.6*  MG  --  1.7 1.6* 1.6*  PHOS  --  2.4* 2.8 1.8*    GFR: Estimated Creatinine Clearance: 72.6 mL/min (A) (by C-G formula based on SCr of 0.33 mg/dL (L)).  Liver Function Tests: Recent Labs  Lab 2021-03-09 1630 03/08/21 0437 03/09/21 0543  AST 40 29 31  ALT 33 28 30  ALKPHOS 166* 143* 161*  BILITOT 0.7 0.7 0.5  PROT 5.7* 5.4* 5.1*  ALBUMIN 2.6* 2.5* 2.4*    CBG: Recent Labs  Lab 03/10/21 1627 03/10/21 2056 03/11/21 0720 03/11/21 1109 03/11/21 1617  GLUCAP 127* 130* 125* 192* 120*     Recent Results (from the past 240 hour(s))  Resp Panel by RT-PCR (Flu A&B, Covid) Nasopharyngeal Swab     Status: None   Collection Time: 03/09/2021  4:30 PM   Specimen: Nasopharyngeal Swab; Nasopharyngeal(NP) swabs in vial transport medium  Result Value Ref Range Status   SARS Coronavirus 2 by RT PCR NEGATIVE NEGATIVE Final    Comment: (NOTE) SARS-CoV-2 target nucleic acids are NOT DETECTED.  The SARS-CoV-2 RNA is generally detectable  in upper respiratory specimens during the acute phase of infection. The lowest concentration of SARS-CoV-2 viral copies this assay can detect is 138 copies/mL. A negative result does not preclude SARS-Cov-2 infection and should not be used as the sole basis for treatment or other patient management decisions. A negative result may occur with  improper specimen collection/handling, submission of specimen other than nasopharyngeal swab, presence of viral mutation(s) within the areas targeted by this assay, and inadequate number of viral copies(<138 copies/mL). A negative result must be combined with clinical observations, patient history, and epidemiological information. The expected result is Negative.  Fact Sheet for Patients:  BloggerCourse.com  Fact Sheet for Healthcare Providers:   SeriousBroker.it  This test is no t yet approved or cleared by the [place] FDA and  has been authorized for detection and/or diagnosis of SARS-CoV-2 by FDA under an Emergency Use Authorization (EUA). This EUA will remain  in effect (meaning this test can be used) for the duration of the COVID-19 declaration under Section 564(b)(1) of the Act, 21 U.S.C.section 360bbb-3(b)(1), unless the authorization is terminated  or revoked sooner.       Influenza A by PCR NEGATIVE NEGATIVE Final   Influenza B by PCR NEGATIVE NEGATIVE Final    Comment: (NOTE) The Xpert Xpress SARS-CoV-2/FLU/RSV plus assay is intended as an aid in the diagnosis of influenza from Nasopharyngeal swab specimens and should not be used as a sole basis for treatment. Nasal washings and aspirates are unacceptable for Xpert Xpress SARS-CoV-2/FLU/RSV testing.  Fact Sheet for Patients: BloggerCourse.com  Fact Sheet for Healthcare Providers: SeriousBroker.it  This test is not yet approved or cleared by the [place] FDA and has been authorized for detection and/or diagnosis of SARS-CoV-2 by FDA under an Emergency Use Authorization (EUA). This EUA will remain in effect (meaning this test can be used) for the duration of the COVID-19 declaration under Section 564(b)(1) of the Act, 21 U.S.C. section 360bbb-3(b)(1), unless the authorization is terminated or revoked.  Performed at [facility], [address], [place], [place] [address]   Blood culture (routine x 2)     Status: None (Preliminary result)   Collection Time: 03/08/21  6:39 PM   Specimen: BLOOD LEFT HAND  Result Value Ref Range Status   Specimen Description   Final    BLOOD LEFT HAND Performed at [facility], [address], [place], [place] [address]    Special Requests   Final    BOTTLES DRAWN AEROBIC AND ANAEROBIC Blood Culture adequate volume Performed at [facility], [address] [place], [place] [address]    Culture   Final    NO GROWTH 4 DAYS Performed at [facility], [address] [place], [place] [address]    Report Status PENDING  Incomplete  Blood culture (routine x 2)     Status: None (Preliminary result)   Collection Time: 03-08-21  6:42 PM   Specimen: BLOOD LEFT HAND  Result Value Ref Range Status   Specimen Description   Final    BLOOD LEFT HAND Performed at [facility], [address] [place], [place] [address]    Special Requests   Final    BOTTLES DRAWN AEROBIC ONLY Blood Culture adequate volume Performed at [facility], [address] [place], [place] [address]    Culture   Final    NO GROWTH 4 DAYS Performed at [facility], [address], [place], [place] [address]    Report Status PENDING  Incomplete     Radiology Studies: No results found.  Scheduled Meds: . Chlorhexidine Gluconate Cloth  6 each Topical Daily  . enoxaparin (LOVENOX) injection  40 mg Subcutaneous Q24H  . feeding supplement (OSMOLITE 1.5 CAL)  1,000 mL Per Tube Q24H  . feeding supplement (PROSource TF)  90 mL Per Tube TID  . gabapentin  800 mg Per Tube TID  . insulin aspart  0-6 Units Subcutaneous TID WC  . magnesium oxide  400 mg Per Tube Daily  . nystatin  5 mL Mouth/Throat QID  . oxyCODONE  5 mg Per Tube Q6H  . pantoprazole sodium  40 mg Per Tube Daily   Continuous Infusions: . 0.9 % NaCl with KCl 20 mEq / L 75 mL/hr at 03/11/21 0809     LOS: 3 days    Time spent: 30 minutes.    Vassie Loll, MD [facility]   To contact the attending provider between 7A-7P or the covering provider during after hours 7P-7A, please log into the web site [email or web address] and access using universal [place] password for that web site. If you do not have the password, please call the hospital operator.  03/11/2021, 4:22 PM

## 2021-03-12 DIAGNOSIS — R627 Adult failure to thrive: Secondary | ICD-10-CM | POA: Diagnosis not present

## 2021-03-12 DIAGNOSIS — Z515 Encounter for palliative care: Secondary | ICD-10-CM

## 2021-03-12 DIAGNOSIS — C787 Secondary malignant neoplasm of liver and intrahepatic bile duct: Secondary | ICD-10-CM | POA: Diagnosis not present

## 2021-03-12 DIAGNOSIS — J449 Chronic obstructive pulmonary disease, unspecified: Secondary | ICD-10-CM | POA: Diagnosis not present

## 2021-03-12 DIAGNOSIS — Z7189 Other specified counseling: Secondary | ICD-10-CM | POA: Diagnosis not present

## 2021-03-12 DIAGNOSIS — C069 Malignant neoplasm of mouth, unspecified: Secondary | ICD-10-CM | POA: Diagnosis not present

## 2021-03-12 DIAGNOSIS — E860 Dehydration: Secondary | ICD-10-CM | POA: Diagnosis not present

## 2021-03-12 LAB — CULTURE, BLOOD (ROUTINE X 2)
Culture: NO GROWTH
Culture: NO GROWTH
Special Requests: ADEQUATE
Special Requests: ADEQUATE

## 2021-03-12 LAB — GLUCOSE, CAPILLARY
Glucose-Capillary: 137 mg/dL — ABNORMAL HIGH (ref 70–99)
Glucose-Capillary: 108 mg/dL — ABNORMAL HIGH (ref 70–99)
Glucose-Capillary: 133 mg/dL — ABNORMAL HIGH (ref 70–99)
Glucose-Capillary: 141 mg/dL — ABNORMAL HIGH (ref 70–99)

## 2021-03-12 NOTE — Progress Notes (Signed)
PROGRESS NOTE    Lance [name]  MWN:[id number] DOB: 1951/08/16 DOA: 02/18/2021 PCP: [name], MD   Chief Complaint  Patient presents with  . Failure To Thrive    Brief admission narrative:  As per H&P written by Dr. Randol Kern on 02/16/2021 Lance [name]  is a 70 y.o. 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, and non-small cell lung cancer, status post chemo and radiation therapy, most recently patient was seen by his oncologist in April 2021, patient failed to show up on his follow-up appointment(despite receiving multiple voicemail as noted on epic), patient has PEG tube, apparently his caregiver (girlfriend) passed away last 09-14-23, patient was brought by her sister for failure to thrive, patient reports has been having cough for few days, has been feeling dyspneic as well, reports he is having very poor oral intake, usually he is on pured diet, drinking little fluids as well, he has a PEG tube, but reports he has not used it for a while, he denies fever, chills, chest pain. - in ED work-up was significant for albumin of 2.6, white blood cell count of 20K, for widely metastatic disease with multiple bilateral nodules, liver metastasis, abdominal metastasis, and lytic lesions in vertebral bodies,[facility] hospitalist consulted to admit.  Assessment & Plan: 1-Oropharyngeal cancer [facility]) -Patient receiving radiotherapy and chemotherapy; has ended losing follow-up and at this time presenting with overall worsening condition failure to thrive. -Patient understands there is not cure and is looking to see improvement of overall symptoms management, hydration and nutrition. -Per oncology service recommendations not a candidate for any further treatment or intervention.  Strongly suggested palliative care involvement in hospital. -Palliative to continue discussing with patient and family on 03/13/2021 discharge pathways.  2-Oral thrush -Will continue treatment with  nystatin and Diflucan -continue to Follow response -Overall improved.  3-dysphagia -Continue maintain hydration and nutrition through PEG tube at this point  -Dietitian consultation appreciated  -Speech therapy has seen patient and at this time he has declined MBS and is looking to take the risk of aspiration while-dysphagia 1 with thin liquids allowed.  4-COPD (chronic obstructive pulmonary disease) (HCC) -No wheezing and no using accessory muscles -Continue bronchodilator management as neede.  5-chronic pain syndrome -Continue PRN narcotics -having trouble with extended release option while unable to give meds orally; will make immediate release narcotic available more frequent.  6-type 2 diabetes -Continue sliding scale insulin with close CBGs hematology -continue Holding oral hypoglycemic agents while inpatient.  7-hypertension -Vital signs stable -Holding oral antihypertensive agents in the setting of dehydration and failure to thrive. -follow BP  8-failure to thrive/severe protein continue nutrition -As mentioned above initiate treatment with tube feedings through PEG tube -Follow electrolytes for refeeding syndrome  DVT prophylaxis: Lovenox Code Status: DNR Family Communication: Case discussed with sister at bedside. Disposition:   Status is: Inpatient  Dispo: The patient is from: Home              Anticipated d/c is to: To be determined              Patient currently is not medically stable for discharge; still having difficulty with swallowing and initiating PEG tube feedings; continue close monitoring to minimize the chances for refeeding syndrome.   Difficult to place patient : No yet    Consultants:  Palliative care Dietitian service Oncology service.   Procedures:  See below for x-ray reports   Antimicrobials/antifungal:  Diflucan   Subjective: No chest pain,  no nausea, no vomiting.  So far tolerating PEG tube feedings.  Patient is afebrile.   Telemetry consult medications placed.  Objective: Vitals:   03/11/21 2034 03/12/21 0523 03/12/21 0732 03/12/21 1446  BP: 129/79 (!) 135/92 129/76 124/67  Pulse: (!) 104 79 95 100  Resp: 19 19 (!) 21   Temp: 98.6 F (37 C) 97.7 F (36.5 C)  98 F (36.7 C)  TempSrc:  Oral  Oral  SpO2: (!) 88% (!) 88% (!) 85% (!) 87%  Weight:      Height:        Intake/Output Summary (Last 24 hours) at 03/12/2021 1756 Last data filed at 03/12/2021 1120 Gross per 24 hour  Intake 3215.36 ml  Output 900 ml  Net 2315.36 ml   Filed Weights   03/08/21 1600 03/11/21 0600  Weight: 49.7 kg 59.7 kg    Examination: General exam: Alert, awake, oriented x 3; [place] ill in appearance and significantly cachectic.  No nausea, no vomiting, no chest pain.  Intermittent coughing spells while eating distress feeling better having something by mouth.  No fever.  2 L nasal cannula supplementation in place. Respiratory system: No using accessory muscles.  Positive scattered rhonchi. Cardiovascular system:RRR. No murmurs, rubs, gallops.  No JVD. Gastrointestinal system: Abdomen is nondistended, soft and nontender. No organomegaly or masses felt. Normal bowel sounds heard. Central nervous system:No focal neurological deficits. Extremities: No cyanosis or clubbing. Skin: No petechiae. Psychiatry: Mood & affect appropriate.     Data Reviewed: I have personally reviewed following labs and denies studies  CBC: Recent Labs  Lab 03-11-2021 1630 03/08/21 0437 03/11/21 0452  WBC 20.0* 18.9* 18.7*  NEUTROABS 18.7*  --   --   HGB 11.0* 10.7* 10.1*  HCT 34.9* 33.5* 33.4*  MCV 94.1 93.6 96.5  PLT 402* 389 327    Basic Metabolic Panel: Recent Labs  Lab 11-Mar-2021 1630 03/08/21 0437 03/09/21 0543 03/11/21 0452  NA 141 142 141 143  K 3.6 3.3* 3.6 3.3*  CL 103 106 108 108  CO2 28 26 26 26   GLUCOSE 148* 84 116* 148*  BUN 28* 21 23 20   CREATININE 0.57* 0.40* 0.36* 0.33*  CALCIUM 9.2 9.0 8.9 8.6*  MG  --  1.7 1.6*  1.6*  PHOS  --  2.4* 2.8 1.8*    GFR: Estimated Creatinine Clearance: 72.6 mL/min (A) (by C-G formula based on SCr of 0.33 mg/dL (L)).  Liver Function Tests: Recent Labs  Lab 11-Mar-2021 1630 03/08/21 0437 03/09/21 0543  AST 40 29 31  ALT 33 28 30  ALKPHOS 166* 143* 161*  BILITOT 0.7 0.7 0.5  PROT 5.7* 5.4* 5.1*  ALBUMIN 2.6* 2.5* 2.4*    CBG: Recent Labs  Lab 03/11/21 1617 03/11/21 2028 03/12/21 0742 03/12/21 1147 03/12/21 1609  GLUCAP 120* 160* 137* 108* 133*     Recent Results (from the past 240 hour(s))  Resp Panel by RT-PCR (Flu A&B, Covid) Nasopharyngeal Swab     Status: None   Collection Time: 2021/03/11  4:30 PM   Specimen: Nasopharyngeal Swab; Nasopharyngeal(NP) swabs in vial transport medium  Result Value Ref Range Status   SARS Coronavirus 2 by RT PCR NEGATIVE NEGATIVE Final    Comment: (NOTE) SARS-CoV-2 target nucleic acids are NOT DETECTED.  The SARS-CoV-2 RNA is generally detectable in upper respiratory specimens during the acute phase of infection. The lowest concentration of SARS-CoV-2 viral copies this assay can detect is 138 copies/mL. A negative result does not preclude SARS-Cov-2 infection and  should not be used as the sole basis for treatment or other patient management decisions. A negative result may occur with  improper specimen collection/handling, submission of specimen other than nasopharyngeal swab, presence of viral mutation(s) within the areas targeted by this assay, and inadequate number of viral copies(<138 copies/mL). A negative result must be combined with clinical observations, patient history, and epidemiological information. The expected result is Negative.  Fact Sheet for Patients:  BloggerCourse.com  Fact Sheet for Healthcare Providers:  SeriousBroker.it  This test is no t yet approved or cleared by the [place] FDA and  has been authorized for detection and/or  diagnosis of SARS-CoV-2 by FDA under an Emergency Use Authorization (EUA). This EUA will remain  in effect (meaning this test can be used) for the duration of the COVID-19 declaration under Section 564(b)(1) of the Act, 21 U.S.C.section 360bbb-3(b)(1), unless the authorization is terminated  or revoked sooner.       Influenza A by PCR NEGATIVE NEGATIVE Final   Influenza B by PCR NEGATIVE NEGATIVE Final    Comment: (NOTE) The Xpert Xpress SARS-CoV-2/FLU/RSV plus assay is intended as an aid in the diagnosis of influenza from Nasopharyngeal swab specimens and should not be used as a sole basis for treatment. Nasal washings and aspirates are unacceptable for Xpert Xpress SARS-CoV-2/FLU/RSV testing.  Fact Sheet for Patients: BloggerCourse.com  Fact Sheet for Healthcare Providers: SeriousBroker.it  This test is not yet approved or cleared by the [place] FDA and has been authorized for detection and/or diagnosis of SARS-CoV-2 by FDA under an Emergency Use Authorization (EUA). This EUA will remain in effect (meaning this test can be used) for the duration of the COVID-19 declaration under Section 564(b)(1) of the Act, 21 U.S.C. section 360bbb-3(b)(1), unless the authorization is terminated or revoked.  Performed at [facility], [address] [place], [place] [address]   Blood culture (routine x 2)     Status: None   Collection Time: 03/06/2021  6:39 PM   Specimen: BLOOD LEFT HAND  Result Value Ref Range Status   Specimen Description   Final    BLOOD LEFT HAND Performed at [facility], [address] [place], [place] [address]    Special Requests   Final    BOTTLES DRAWN AEROBIC AND ANAEROBIC Blood Culture adequate volume Performed at [facility], [address], [place], [place] [address]    Culture   Final    NO GROWTH 5 DAYS Performed at [facility], [address], [place], [place] [address]    Report  Status 03/12/2021 FINAL  Final  Blood culture (routine x 2)     Status: None   Collection Time: 02/20/2021  6:42 PM   Specimen: BLOOD LEFT HAND  Result Value Ref Range Status   Specimen Description   Final    BLOOD LEFT HAND Performed at [facility], [address], [place], [place] [address]    Special Requests   Final    BOTTLES DRAWN AEROBIC ONLY Blood Culture adequate volume Performed at [facility], [address], [place], [place] [address]    Culture   Final    NO GROWTH 5 DAYS Performed at [facility], [address], [place], [place] [address]    Report Status 03/12/2021 FINAL  Final     Radiology Studies: No results found.  Scheduled Meds: . Chlorhexidine Gluconate Cloth  6 each Topical Daily  . enoxaparin (LOVENOX) injection  40 mg Subcutaneous Q24H  . feeding  supplement (OSMOLITE 1.5 CAL)  1,000 mL Per Tube Q24H  . feeding supplement (PROSource TF)  90 mL Per Tube TID  . gabapentin  800 mg Per Tube TID  . insulin aspart  0-6 Units Subcutaneous TID WC  . magnesium oxide  400 mg Per Tube Daily  . nystatin  5 mL Mouth/Throat QID  . oxyCODONE  5 mg Per Tube Q6H  . pantoprazole sodium  40 mg Per Tube Daily   Continuous Infusions: . 0.9 % NaCl with KCl 20 mEq / L 75 mL/hr at 03/12/21 1541     LOS: 4 days    Time spent: 30 minutes.    Vassie Loll, MD [facility]   To contact the attending provider between 7A-7P or the covering provider during after hours 7P-7A, please log into the web site [email or web address] and access using universal [place] password for that web site. If you do not have the password, please call the hospital operator.  03/12/2021, 5:56 PM

## 2021-03-12 NOTE — TOC Progression Note (Signed)
Transition of Care [facility]) - Progression Note    Patient Details  Name: Lance [name] MRN: [id number] Date of Birth: 1951-04-11  Transition of Care [facility]) CM/SW Contact  Boneta Lucks, RN Phone Number: 03/12/2021, 3:27 PM  Clinical Narrative:   Palliative discussion to continue in the morning. TOC spoke with [name] his sister.  She was requesting [facility].  Per Ebony Hail patient would need to have medicaid application in for office to review to see if they could accept for palliative/Hospice. He could private pay, rate is $332 a day for semi private room. Patient's has no one to care for him at home and does not have good living conditions. Home needs bed bug treatment.   Patient mentioned a brother. AJerline Pain will discuss these options and residential Hospice with patinet and family tomorrow morning.     Expected Discharge Plan: Home w Hospice Care Barriers to Discharge: Continued Medical Work up  Expected Discharge Plan and Services Expected Discharge Plan: [place] arrangements for the past 2 months: Single Family Home                   Readmission Risk Interventions Readmission Risk Prevention Plan 03/12/2021  Transportation Screening Complete  HRI or Home Care Consult Complete  Social Work Consult for [place] Planning/Counseling Complete  Palliative Care Screening Complete  Medication Review [employer]) Complete  Some recent data might be hidden

## 2021-03-12 NOTE — Progress Notes (Signed)
Patient has complicated family dynamics.  Patient niece came to floor last night to visit patient and was concerned about patient discharge/future placement.  Niece explained that patient son was in currently incarcerated and unable to see patient.  Niece also did not seem to agree with decisions made by patient sister.

## 2021-03-12 NOTE — Consult Note (Signed)
Consultation Note Date: 03/12/2021   Patient Name: Lance [name]  DOB: 11/26/1951  MRN: [id number]  Age / Sex: 70 y.o., male  PCP: Lance Peri, MD Referring Physician: Vassie Loll, MD  Reason for Consultation: Establishing goals of care  HPI/Patient Profile: 70 y.o. male  with past medical history of oropharyngeal squamous cell carcinoma (lost to follow up), [facility], s/p chemo/radiation therapy 2020, PEG tube, lost to follow up and noted that his girlfriend/caregiver died last 09-11-2023 and sister brought him to hospital for admission on 03/21/21 with failure to thrive, poor intake and not using PEG for feedings. Found to have leukocytosis and widely metastatic disease with multiple bilateral lung nodules, liver mets, abd mets, lytic lesions in thoracic vertebral bodies. Also found to have oral thrush and ongoing dysphagia but tolerating feeding via PEG. Unfortunately he is no longer a good candidate for cancer treatment due to widespread metastatic disease and oncology recommending hospice care.   Clinical Assessment and Goals of Care: I met today at Lance [name] bedside. I attempted earlier today but nurses working with him and he has been asleep the rest of the day. I did gently awaken him so that we could have a conversation. Lance [name] does speak with me but is mostly voicing his frustration that he wants to go home but the doctors tell him he is not ready. We did discuss his cancer and how this has spread significantly and that treatment is not a good option for him because it could do more harm than benefit given the extent of his cancer - he verbalizes understanding. He does want to continue tube feeding at this time. He tells me that he would like to go home and I told him that he would need a lot of help and could even use help from hospice. He seemed open to this option.   I spoke further with his sister, Lance [name].  Lance [name] has good understanding of her brother's condition and decline. If they wish to continue tube feeding then hospice facility is not a good option. They would ideally want SNF placement as [place] [name] shares there is nobody to care for him at home and his home is in bad shape (bed bugs). We did discuss that the payment for facility could be an issue. I will put Lance [name] in touch with [facility] team to further discuss options. Lance [name] also plans to come to hospital to visit tomorrow and will let me know when she comes so that we can speak together along with her brother regarding goals of care and plan moving forward. I would like to complete MOST form with them.   Of note, Lance [name] significant other/caregiver died in 09/11/23. He has one son incarcerated, another son unknown location, and a daughter who is deceased. He is not married and his surrogate decision makers would be his siblings Lance [name] has been his main support). I am hoping I can better clarify goals of care with Lance. [place] tomorrow with his sister present for support.   All questions/concerns  addressed. Emotional support provided.    Primary Decision Maker PATIENT    SUMMARY OF RECOMMENDATIONS   - Placement is an issue and dependent on goals of care to be better clarified tomorrow  Code Status/Advance Care Planning:  DNR   Symptom Management:   Per attending.   Palliative Prophylaxis:   Aspiration, Bowel Regimen, Delirium Protocol, Frequent Pain Assessment, Oral Care and Turn Reposition  Prognosis:   Overall prognosis poor. With treatment of thrush and increased intake he could live a little longer especially if tube feeding wish to be continued. He is high risk for acute decline at any time with advanced cancer and failure to thrive.    Discharge Planning: To be determined. No good options here. Potentially hospice facility appropriate with full comfort focused care and terminating tube feeding is aligned with goals of care (not sure this  is). His home is not able to be lived in currently and he has no help at home. Is there a family member he can go to their home with hospice support??? Unfortunately SNF level care would be pay out of pocket which is also not an option for them. Will discuss further tomorrow during family meeting. Discussed with TOC Lance Bleak, RN.       Primary Diagnoses: Present on Admission: . Cancer of mouth (HCC) . COPD (chronic obstructive pulmonary disease) (HCC) . Depression . Hypertension . Oropharyngeal cancer (HCC) . Primary cancer of left upper lobe of lung (HCC) . Failure to thrive in adult   I have reviewed the medical record, interviewed the patient and family, and examined the patient. The following aspects are pertinent.  Past Medical History:  Diagnosis Date  . Arthritis    hand  . Back pain   . Blind left eye   . Cancer (HCC)    oral cancer  . COPD (chronic obstructive pulmonary disease) (HCC)    mild per pt  . Depression   . Diabetes mellitus without complication (HCC)    type II  . Dyspnea    with much activity  . GERD (gastroesophageal reflux disease)   . Gout   . Hypertension   . Neuropathy   . Port-A-Cath in place 06/07/2019   Social History   Socioeconomic History  . Marital status: Significant Other    Spouse name: Not on file  . Number of children: Not on file  . Years of education: Not on file  . Highest education level: Not on file  Occupational History  . Not on file  Tobacco Use  . Smoking status: Former Smoker    Packs/day: 1.00    Years: 55.00    Pack years: 55.00    Types: Cigarettes    Quit date: 02/25/2019    Years since quitting: 2.0  . Smokeless tobacco: Never Used  Vaping Use  . Vaping Use: Never used  Substance and Sexual Activity  . Alcohol use: Yes    Alcohol/week: 1.0 standard drink    Types: 1 Cans of beer per week    Comment: occasional   . Drug use: No  . Sexual activity: Not Currently  Other Topics Concern  . Not on  file  Social History Narrative  . Not on file   Social Determinants of Health   Financial Resource Strain: Not on file  Food Insecurity: Not on file  Transportation Needs: Not on file  Physical Activity: Not on file  Stress: Not on file  Social Connections: Not on file   Family  History  Problem Relation Age of Onset  . Heart attack Mother   . COPD Mother   . Lung cancer Father   . Lung cancer Brother    Scheduled Meds: . Chlorhexidine Gluconate Cloth  6 each Topical Daily  . enoxaparin (LOVENOX) injection  40 mg Subcutaneous Q24H  . feeding supplement (OSMOLITE 1.5 CAL)  1,000 mL Per Tube Q24H  . feeding supplement (PROSource TF)  90 mL Per Tube TID  . gabapentin  800 mg Per Tube TID  . insulin aspart  0-6 Units Subcutaneous TID WC  . magnesium oxide  400 mg Per Tube Daily  . nystatin  5 mL Mouth/Throat QID  . oxyCODONE  5 mg Per Tube Q6H  . pantoprazole sodium  40 mg Per Tube Daily   Continuous Infusions: . 0.9 % NaCl with KCl 20 mEq / L 75 mL/hr at 03/11/21 2244   PRN Meds:.HYDROmorphone (DILAUDID) injection, magic mouthwash w/lidocaine, oxyCODONE, Resource ThickenUp Clear No Known Allergies Review of Systems  Constitutional: Positive for activity change, appetite change, fatigue and unexpected weight change.  Neurological: Positive for weakness.    Physical Exam Vitals and nursing note reviewed.  Constitutional:      General: He is sleeping. He is not in acute distress.    Appearance: He is cachectic. He is ill-appearing.  Cardiovascular:     Rate and Rhythm: Normal rate.  Pulmonary:     Effort: No tachypnea, accessory muscle usage or respiratory distress.  Abdominal:     General: Abdomen is flat.  Neurological:     Mental Status: He is oriented to person, place, and time.     Comments: Dysarthria and difficult to understand at times but seems to be oriented      Vital Signs: BP 129/76 (BP Location: Left Arm)   Pulse 95   Temp 97.7 F (36.5 C) (Oral)    Resp (!) 21   Ht 6' (1.829 m)   Wt 59.7 kg   SpO2 (!) 85%   BMI 17.85 kg/m  Pain Scale: 0-10   Pain Score: 0-No pain   SpO2: SpO2: (!) 85 % O2 Device:SpO2: (!) 85 % O2 Flow Rate: .O2 Flow Rate (L/min): 2 L/min  IO: Intake/output summary:   Intake/Output Summary (Last 24 hours) at 03/12/2021 0915 Last data filed at 03/12/2021 0500 Gross per 24 hour  Intake 2795.36 ml  Output 1200 ml  Net 1595.36 ml    LBM: Last BM Date: 03/09/21 Baseline Weight: Weight: 49.7 kg Most recent weight: Weight: 59.7 kg     Palliative Assessment/Data:     Time In: 1330 Time Out: 1445 Time Total: 75 min Greater than 50%  of this time was spent counseling and coordinating care related to the above assessment and plan.  Signed by: Yong Channel, NP Palliative Medicine Team Pager # [phone] (M-F 8a-5p) Team Phone # [phone] (Nights/Weekends)

## 2021-03-13 DIAGNOSIS — R627 Adult failure to thrive: Secondary | ICD-10-CM | POA: Diagnosis not present

## 2021-03-13 DIAGNOSIS — C787 Secondary malignant neoplasm of liver and intrahepatic bile duct: Secondary | ICD-10-CM | POA: Diagnosis not present

## 2021-03-13 DIAGNOSIS — Z515 Encounter for palliative care: Secondary | ICD-10-CM | POA: Diagnosis not present

## 2021-03-13 DIAGNOSIS — Z7189 Other specified counseling: Secondary | ICD-10-CM | POA: Diagnosis not present

## 2021-03-13 LAB — GLUCOSE, CAPILLARY
Glucose-Capillary: 133 mg/dL — ABNORMAL HIGH (ref 70–99)
Glucose-Capillary: 198 mg/dL — ABNORMAL HIGH (ref 70–99)
Glucose-Capillary: 163 mg/dL — ABNORMAL HIGH (ref 70–99)
Glucose-Capillary: 102 mg/dL — ABNORMAL HIGH (ref 70–99)

## 2021-03-13 NOTE — Progress Notes (Signed)
PROGRESS NOTE    Lance [name]  ZOX:[id number] DOB: [birth year] DOA: 2021-03-28 PCP: [name], MD   Brief Narrative:  As per H&P written by Dr. 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, and non-small cell lung cancer, status post chemo and radiation therapy, most recently patient was seen by his oncologist in April 2021, patient failed to show up on his follow-up appointment(despite receiving multiple voicemail as noted on epic),patient has PEG tube, apparently his caregiver (girlfriend)passed away last 09/18/23, patient was brought by her sister for failure to thrive, patient reports has been having cough for few days, has been feeling dyspneic as well, reports he is having very poor oral intake, usually he is on pured diet, drinking little fluids as well, he has a PEG tube, but reports he has not used itfor a while, he denies fever, chills, chest pain. - in EDwork-up was significant for albumin of 2.6, white blood cell count of 20K,for widely metastatic disease with multiple bilateral nodules, liver metastasis, abdominal metastasis, and lytic lesions in vertebral bodies,[facility] hospitalist consulted to admit.  -Planning for discharge in a.m. to home with hospice.  Assessment & Plan:   Active Problems:   Oropharyngeal cancer (HCC)   Cancer of mouth (HCC)   COPD (chronic obstructive pulmonary disease) (HCC)   Depression   Hypertension   Type 2 diabetes mellitus (HCC)   Primary cancer of left upper lobe of lung (HCC)   Failure to thrive in adult   Pressure injury of skin   Protein-calorie malnutrition, severe   Metastatic cancer to liver (HCC)   1-Oropharyngeal cancer (HCC) -Patient receiving radiotherapy and chemotherapy; has ended losing follow-up and at this time presenting with overall worsening condition failure to thrive. -Patient understands there is not cure and is looking to see  improvement of overall symptoms management, hydration and nutrition. -Per oncology service recommendations not a candidate for any further treatment or intervention.  Strongly suggested palliative care involvement in hospital. -Palliative to continue discussing with patient and family on 03/13/2021 discharge pathways.  2-Oral thrush -Will continue treatment with nystatin and Diflucan -continue to Follow response -Overall improved.  3-dysphagia -Continue maintain hydration and nutrition through PEG tube at this point  -Dietitian consultation appreciated  -Speech therapy has seen patient and at this time he has declined MBS and is looking to take the risk of aspiration while-dysphagia 1 with thin liquids allowed.  4-COPD (chronic obstructive pulmonary disease) (HCC) -No wheezing and no using accessory muscles -Continue bronchodilator management as neede.  5-chronic pain syndrome -Continue PRN narcotics -having trouble with extended release option while unable to give meds orally; will make immediate release narcotic available more frequent.  6-type 2 diabetes -Continue sliding scale insulin with close CBGs hematology -continue Holding oral hypoglycemic agents while inpatient.  7-hypertension -Vital signs stable -Holding oral antihypertensive agents in the setting of dehydration and failure to thrive. -follow BP  8-failure to thrive/severe protein continue nutrition -As mentioned above initiate treatment with tube feedings through PEG tube -Follow electrolytes for refeeding syndrome  DVT prophylaxis: Lovenox Code Status: DNR Family Communication: Case discussed with sister at bedside. Disposition:   Status is: Inpatient  Dispo: The patient is from: Home  Anticipated d/c is to: To be determined  Patient currently is not medically stable for discharge; still having difficulty with swallowing and initiating PEG tube feedings; continue close  monitoring to minimize the chances for refeeding syndrome.  Difficult to place patient : No yet    Consultants:  Palliative care Dietitian service Oncology service.   Procedures:  See below for x-ray reports   Antimicrobials/antifungal:  Diflucan  Subjective: Patient seen and evaluated today with no new acute complaints or concerns. No acute concerns or events noted overnight.  He is currently tolerating PEG tube feedings.  Objective: Vitals:   03/12/21 1832 03/12/21 2102 03/13/21 0458 03/13/21 0902  BP: (!) 147/79 139/86 140/76 121/71  Pulse: 64 (!) 103 94 (!) 102  Resp: 20 20 20    Temp: 99.3 F (37.4 C) 98 F (36.7 C) 98.7 F (37.1 C)   TempSrc: Oral Oral Oral   SpO2: 92% 94% (!) 88% 91%  Weight:   56.2 kg   Height:        Intake/Output Summary (Last 24 hours) at 03/13/2021 1304 Last data filed at 03/13/2021 1000 Gross per 24 hour  Intake 240 ml  Output 1050 ml  Net -810 ml   Filed Weights   03/08/21 1600 03/11/21 0600 03/13/21 0458  Weight: 49.7 kg 59.7 kg 56.2 kg    Examination:  General exam: Appears calm and comfortable, emaciated Respiratory system: Clear to auscultation. Respiratory effort normal. Cardiovascular system: S1 & S2 heard, RRR.  Gastrointestinal system: Abdomen is soft, PEG tube present, clean, dry, and intact Central nervous system: Alert and awake Extremities: No edema Skin: No significant lesions noted Psychiatry: Flat affect.    Data Reviewed: I have personally reviewed following labs and imaging studies  CBC: Recent Labs  Lab Mar 10, 2021 1630 03/08/21 0437 03/11/21 0452  WBC 20.0* 18.9* 18.7*  NEUTROABS 18.7*  --   --   HGB 11.0* 10.7* 10.1*  HCT 34.9* 33.5* 33.4*  MCV 94.1 93.6 96.5  PLT 402* 389 327   Basic Metabolic Panel: Recent Labs  Lab Mar 10, 2021 1630 03/08/21 0437 03/09/21 0543 03/11/21 0452  NA 141 142 141 143  K 3.6 3.3* 3.6 3.3*  CL 103 106 108 108  CO2 28 26 26 26   GLUCOSE 148* 84 116*  148*  BUN 28* 21 23 20   CREATININE 0.57* 0.40* 0.36* 0.33*  CALCIUM 9.2 9.0 8.9 8.6*  MG  --  1.7 1.6* 1.6*  PHOS  --  2.4* 2.8 1.8*   GFR: Estimated Creatinine Clearance: 68.3 mL/min (A) (by C-G formula based on SCr of 0.33 mg/dL (L)). Liver Function Tests: Recent Labs  Lab March 10, 2021 1630 03/08/21 0437 03/09/21 0543  AST 40 29 31  ALT 33 28 30  ALKPHOS 166* 143* 161*  BILITOT 0.7 0.7 0.5  PROT 5.7* 5.4* 5.1*  ALBUMIN 2.6* 2.5* 2.4*   No results for input(s): LIPASE, AMYLASE in the last 168 hours. No results for input(s): AMMONIA in the last 168 hours. Coagulation Profile: No results for input(s): INR, PROTIME in the last 168 hours. Cardiac Enzymes: No results for input(s): CKTOTAL, CKMB, CKMBINDEX, TROPONINI in the last 168 hours. BNP (last 3 results) No results for input(s): PROBNP in the last 8760 hours. HbA1C: No results for input(s): HGBA1C in the last 72 hours. CBG: Recent Labs  Lab 03/12/21 1147 03/12/21 1609 03/12/21 2105 03/13/21 0724 03/13/21 1120  GLUCAP 108* 133* 141* 133* 198*   Lipid Profile: No results for input(s): CHOL, HDL, LDLCALC, TRIG, CHOLHDL, LDLDIRECT in the last 72 hours. Thyroid Function Tests: No results for input(s): TSH, T4TOTAL, FREET4, T3FREE, THYROIDAB in the last 72 hours. Anemia Panel: No results for input(s): VITAMINB12, FOLATE, FERRITIN, TIBC, IRON, RETICCTPCT in the last 72  hours. Sepsis Labs: No results for input(s): PROCALCITON, LATICACIDVEN in the last 168 hours.  Recent Results (from the past 240 hour(s))  Resp Panel by RT-PCR (Flu A&B, Covid) Nasopharyngeal Swab     Status: None   Collection Time: 03-17-2021  4:30 PM   Specimen: Nasopharyngeal Swab; Nasopharyngeal(NP) swabs in vial transport medium  Result Value Ref Range Status   SARS Coronavirus 2 by RT PCR NEGATIVE NEGATIVE Final    Comment: (NOTE) SARS-CoV-2 target nucleic acids are NOT DETECTED.  The SARS-CoV-2 RNA is generally detectable in upper  respiratory specimens during the acute phase of infection. The lowest concentration of SARS-CoV-2 viral copies this assay can detect is 138 copies/mL. A negative result does not preclude SARS-Cov-2 infection and should not be used as the sole basis for treatment or other patient management decisions. A negative result may occur with  improper specimen collection/handling, submission of specimen other than nasopharyngeal swab, presence of viral mutation(s) within the areas targeted by this assay, and inadequate number of viral copies(<138 copies/mL). A negative result must be combined with clinical observations, patient history, and epidemiological information. The expected result is Negative.  Fact Sheet for Patients:  BloggerCourse.com  Fact Sheet for Healthcare Providers:  SeriousBroker.it  This test is no t yet approved or cleared by the [place] FDA and  has been authorized for detection and/or diagnosis of SARS-CoV-2 by FDA under an Emergency Use Authorization (EUA). This EUA will remain  in effect (meaning this test can be used) for the duration of the COVID-19 declaration under Section 564(b)(1) of the Act, 21 U.S.C.section 360bbb-3(b)(1), unless the authorization is terminated  or revoked sooner.       Influenza A by PCR NEGATIVE NEGATIVE Final   Influenza B by PCR NEGATIVE NEGATIVE Final    Comment: (NOTE) The Xpert Xpress SARS-CoV-2/FLU/RSV plus assay is intended as an aid in the diagnosis of influenza from Nasopharyngeal swab specimens and should not be used as a sole basis for treatment. Nasal washings and aspirates are unacceptable for Xpert Xpress SARS-CoV-2/FLU/RSV testing.  Fact Sheet for Patients: BloggerCourse.com  Fact Sheet for Healthcare Providers: SeriousBroker.it  This test is not yet approved or cleared by the [place] FDA and has been  authorized for detection and/or diagnosis of SARS-CoV-2 by FDA under an Emergency Use Authorization (EUA). This EUA will remain in effect (meaning this test can be used) for the duration of the COVID-19 declaration under Section 564(b)(1) of the Act, 21 U.S.C. section 360bbb-3(b)(1), unless the authorization is terminated or revoked.  Performed at [facility], [address] [place], [place] [address]   Blood culture (routine x 2)     Status: None   Collection Time: 03/17/21  6:39 PM   Specimen: BLOOD LEFT HAND  Result Value Ref Range Status   Specimen Description   Final    BLOOD LEFT HAND Performed at [facility], [address] [place], [place] [address]    Special Requests   Final    BOTTLES DRAWN AEROBIC AND ANAEROBIC Blood Culture adequate volume Performed at [facility], [address] [place], [place] [address]    Culture   Final    NO GROWTH 5 DAYS Performed at [facility], [address], [place], [place] [address]    Report Status 03/12/2021 FINAL  Final  Blood culture (routine x 2)     Status: None   Collection Time: 03/17/2021  6:42 PM   Specimen: BLOOD LEFT HAND  Result Value Ref  Range Status   Specimen Description   Final    BLOOD LEFT HAND Performed at [facility], [address], [place], [place] [address]    Special Requests   Final    BOTTLES DRAWN AEROBIC ONLY Blood Culture adequate volume Performed at [facility], [address] [place], [place] [address]    Culture   Final    NO GROWTH 5 DAYS Performed at [facility], [address], [place], [place] [address]    Report Status 03/12/2021 FINAL  Final         Radiology Studies: No results found.      Scheduled Meds: . Chlorhexidine Gluconate Cloth  6 each Topical Daily  . enoxaparin (LOVENOX) injection  40 mg Subcutaneous Q24H  . feeding supplement (OSMOLITE 1.5 CAL)  1,000 mL Per Tube Q24H  . feeding supplement (PROSource TF)  90 mL Per Tube TID  .  gabapentin  800 mg Per Tube TID  . insulin aspart  0-6 Units Subcutaneous TID WC  . magnesium oxide  400 mg Per Tube Daily  . nystatin  5 mL Mouth/Throat QID  . oxyCODONE  5 mg Per Tube Q6H  . pantoprazole sodium  40 mg Per Tube Daily   Continuous Infusions: . 0.9 % NaCl with KCl 20 mEq / L 75 mL/hr at 03/12/21 1541     LOS: 5 days    Time spent: 35 minutes    Maigan Bittinger Hoover Brunette, DO [facility]  If 7PM-7AM, please contact night-coverage [email or web address] 03/13/2021, 1:04 PM

## 2021-03-13 NOTE — Progress Notes (Signed)
Palliative:  HPI: 70 y.o. male  with past medical history of oropharyngeal squamous cell carcinoma (lost to follow up), [facility], s/p chemo/radiation therapy 2020, PEG tube, lost to follow up and noted that his girlfriend/caregiver died last September 14, 2023 and sister brought him to hospital for admission on 02/20/2021 with failure to thrive, poor intake and not using PEG for feedings. Found to have leukocytosis and widely metastatic disease with multiple bilateral lung nodules, liver mets, abd mets, lytic lesions in thoracic vertebral bodies. Also found to have oral thrush and ongoing dysphagia but tolerating feeding via PEG. Unfortunately he is no longer a good candidate for cancer treatment due to widespread metastatic disease and oncology recommending hospice care.   I met today at Lance [name] bedside with his sister, Lance [name]. Lance [name] is sleeping and has had periods of confusion this morning so we did not awaken him. Lance [name] and I spoke about options from here and focused on full comfort care and transition to hospice facility vs Lance [name] going to his brother's home and having hospice support there. Lance [name] likes the thought of him going to his brother's home as this is next door to his actual home and she feels that this will likely make him the happiest. Lance [name] plans to speak with their brother about this option to see if this could be done. I also gave Lance [name] MOST form to review and discuss with her brother as well. They would like to continue his tube feedings for now if this is an option but she does understand that there is a time when this will only prolong his suffering and the risks outweigh the benefits. Goal is for comfort and quality of life.   I received return call back from [place] who shares that they would like for Lance [name] to come to their brother's home with hospice support. They want to keep the option open to go to hospice facility from home if care becomes too difficult for them. They plan to clean out a room  and space for Lance [name] today with hopes of having equipment delivered tomorrow in anticipation of Lance [name] discharge.   Updated Dr. Manuella Ghazi and [facility] team. All questions/concerns addressed. Emotional support provided.   Exam: Sleeping comfortably. No distress. Thin, frail, cachectic. Breathing regular, unlabored. Abd flat. PEG tube with feedings.   Plan: - D/C to brother's home with hospice support. Siblings will be present to help care for him.   25 min  Vinie Sill, NP Palliative Medicine Team Pager [phone] (Please see [email or web address] for schedule) Team Phone [phone]    Greater than 50%  of this time was spent counseling and coordinating care related to the above assessment and plan

## 2021-03-13 NOTE — Progress Notes (Signed)
Val, LPN, informed that pt has skin tear on sacrum. pericare provided, pt turned, clean, protective sacral patch applied.

## 2021-03-13 NOTE — Progress Notes (Signed)
Noted confusion in patient this morning. Stated he saw people outside his window.

## 2021-03-13 NOTE — Progress Notes (Signed)
Patient keeps taking his oxygen off. When I asked him why he stated it was burning his nose. Added a humidity to oxygen  And turned oxygen up to 4L via [place]. Informed respiratory what I had done.,

## 2021-03-13 NOTE — TOC Progression Note (Signed)
Transition of Care [facility]) - Progression Note    Patient Details  Name: MANFRED LASPINA MRN: [id number] Date of Birth: 06-Jul-1951  [facility] of [facility]) CM/SW Contact  Shade Flood, LCSW Phone Number: 03/13/2021, 2:27 PM  Clinical Narrative:     TOC following. Per Palliative APNP, pt and family request referral to [facility] for hospice care at pt's brother's home. Referral sent in Epic and spoke with [place]. Anticipating dc tomorrow.  Will follow up in AM.  Expected Discharge Plan: Home w Hospice Care Barriers to Discharge: Continued Medical Work up  Expected Discharge Plan and Services Expected Discharge Plan: [place] arrangements for the past 2 months: Single Family Home                                       Social Determinants of Health (SDOH) Interventions    Readmission Risk Interventions Readmission Risk Prevention Plan 03/12/2021  Transportation Screening Complete  HRI or Home Care Consult Complete  Social Work Consult for [place] Planning/Counseling Complete  Palliative Care Screening Complete  Medication Review [employer]) Complete  Some recent data might be hidden

## 2021-03-13 NOTE — Progress Notes (Addendum)
Nutrition Follow up  DOCUMENTATION CODES:   Severe malnutrition in context of chronic illness,Underweight  INTERVENTION: -Continue Osmolite 1.5 @ 40 ml/hr via PEG- provides 1440 kcal, 60 gr protein, 731 ml water.  -ProSource TF 90 ml- TID per tube -supplies 66 gr protein, 240 kcal  -Daily weights   NUTRITION DIAGNOSIS:   Severe Malnutrition related to catabolic illness,social / environmental circumstances as evidenced by  severe fat depletion,severe muscle depletion (metastatic cancer, lives alone (caregiver passed away months ago)) FTT. Patient has not been using PEG tube and limited intake of po orally.   GOAL:  Patient will meet greater than or equal to 90% of their needs if feasible given his cancer diagnosis. Palliative consult pending to discuss [place].  MONITOR:  TF tolerance,Weight trends,Skin,I & O's,Labs,PO intake   ASSESSMENT: Patient is a 70 yo with a hx of COPD, DM, HTN, cancer of the mouth and lung. He has a PEG tube (not using) and is able to take purreed foods. Failure to thrive, severe malnutrition -metastatic cancer.   Patient was able to take some lunch 65% documented. He has not been using PEG at home. Limited diet hx recall. Glucerna 1.5 (7 cartons daily) per medication history.  Discussed with MD- will substitute Osmolite 1.5 and start at a low rate and gradually advance due to his refeeding risk. Patient is tolerating tube feeds will advance rate today. Discussed with nursing.   4/3-Patient has oral thrush. Speech therapy evaluated 4/2- recommendation for NPO. Patient weight has decreased 15.7 kg (24%) compared to 11 months ago which is significant.  4/4- Speech Therapy advanced diet today and pt reports taking ice cream and [place] ice. He is able to feed himself. Palliative consult pending to discuss GOC-to return tomorrow per chart note. Discussed with RN and transition of care - possible Hospice. Will continue with tube feedings at current rate as goals are  being clarified.  4/6-Patient discussed in rounds and with nursing. Patient ate 100% of breakfast this morning. Lunch tray is here and looks untouched. He says, "I am finished with it." Palliative, NP saw his yesterday and goals of care are being clarified. Poor prognosis overall per NP. Patient nutrition provision primarily dependent on tube feeding. Will continue with current TF rate and follow percent po's.   Medications: insulin, protonix, dilaudid and magic mouthwash  IV-NS/ KCL @ 75 ml/hr   Labs reviewed: 4/4 potassium 3.3 (L), Phos 1.8 (L), Mag 1.6, WBC-18.7 (H).  4/2- Albumin 2.4 (L).  CBG's-141, 133, 198.      Diet Order:   Diet Order            DIET - DYS 1 Room service appropriate? Yes; Fluid consistency: Thin  Diet effective now                 EDUCATION NEEDS:  Not appropriate for education at this time  Skin:  Skin Assessment: Skin Integrity Issues: Skin Integrity Issues:: Stage III Stage III: sacrum  Last BM:  4/6  Height:   Ht Readings from Last 1 Encounters:  03/08/21 6' (1.829 m)    Weight:   Wt Readings from Last 1 Encounters:  03/13/21 56.2 kg  04/05/20 65.4 kg  Ideal Body Weight:   81 kg  BMI:  Body mass index is 16.8 kg/m.  Estimated Nutritional Needs:   Kcal:  1950-2200  Protein:  111-124 gr  Fluid:  2 liters daily  Colman Cater MS,RD,CSG,LDN Pager: Shea Evans

## 2021-03-14 ENCOUNTER — Inpatient Hospital Stay (HOSPITAL_COMMUNITY): Payer: Medicare Other

## 2021-03-14 DIAGNOSIS — R627 Adult failure to thrive: Secondary | ICD-10-CM | POA: Diagnosis not present

## 2021-03-14 LAB — GLUCOSE, CAPILLARY
Glucose-Capillary: 145 mg/dL — ABNORMAL HIGH (ref 70–99)
Glucose-Capillary: 176 mg/dL — ABNORMAL HIGH (ref 70–99)
Glucose-Capillary: 119 mg/dL — ABNORMAL HIGH (ref 70–99)
Glucose-Capillary: 149 mg/dL — ABNORMAL HIGH (ref 70–99)

## 2021-03-14 MED ORDER — MAGIC MOUTHWASH W/LIDOCAINE: 15.0000 mL | Freq: Three times a day (TID) | ORAL | 0 refills | Status: AC | PRN

## 2021-03-14 MED ORDER — OSMOLITE 1.5 CAL PO LIQD: 1000.0000 mL | ORAL | 3 refills | Status: DC

## 2021-03-14 MED ORDER — IPRATROPIUM-ALBUTEROL 0.5-2.5 (3) MG/3ML IN SOLN: 3.0000 mL | Freq: Four times a day (QID) | RESPIRATORY_TRACT | Status: DC | PRN

## 2021-03-14 MED ORDER — OSMOLITE 1.5 CAL PO LIQD: 1000.0000 mL | ORAL | 3 refills | Status: AC

## 2021-03-14 NOTE — TOC Progression Note (Signed)
Transition of Care [facility]) - Progression Note    Patient Details  Name: Lance [name] MRN: [id number] Date of Birth: [birth year]  Transition of Care [facility]) CM/SW Contact  Boneta Lucks, RN Phone Number: 03/14/2021, 4:54 PM  Clinical Narrative:   [name] sister at the bedside, stating patient is has declined more today and not thinking they can not take care of him at home. We discussed all equipment is at the home and he is ready for discharge. She wants equipment picked up and patient to go to residential hospice. TOC called Larey Seat they will review his chart in the morning and call with an update.  MD and RN updated.     Expected Discharge Plan: [place]

## 2021-03-14 NOTE — Discharge Summary (Signed)
Physician Discharge Summary  Lance [name] GNF:[id number] DOB: 1950/12/18 DOA: 03-22-2021  PCP: Lance Peri, MD  Admit date: 03-22-2021  Discharge date: 03/14/2021  Admitted From:Home  Disposition:  Home with hospice  Recommendations for Outpatient Follow-up:  1. Follow up with home hospice agency 2. Continue medications as prescribed  Home Health: Home hospice  Equipment/Devices: Per hospice agency  Discharge Condition:Stable  CODE STATUS: DNR  Diet recommendation: Tube feedings and as tolerated orally  Brief/Interim Summary: As per H&P written by Dr. 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, and non-small cell lung cancer, status post chemo and radiation therapy, most recently patient was seen by his oncologist in April 2021, patient failed to show up on his follow-up appointment(despite receiving multiple voicemail as noted on epic),patient has PEG tube, apparently his caregiver (girlfriend)passed away last 09-12-23, patient was brought by her sister for failure to thrive, patient reports has been having cough for few days, has been feeling dyspneic as well, reports he is having very poor oral intake, usually he is on pured diet, drinking little fluids as well, he has a PEG tube, but reports he has not used itfor a while, he denies fever, chills, chest pain. - in EDwork-up was significant for albumin of 2.6, white blood cell count of 20K,for widely metastatic disease with multiple bilateral nodules, liver metastasis, abdominal metastasis, and lytic lesions in vertebral bodies,[facility] hospitalist consulted to admit.  -Patient was admitted with failure to thrive in the setting of oropharyngeal cancer.  He was noted not to be a candidate for any further aggressive treatment or intervention per oncology service and was noted to have some dysphagia and oral thrush which was being treated with nystatin  and Diflucan.  He continues to maintain hydration and nutrition through his PEG tube and has now been set up for home hospice given his failure to thrive and poor long-term prognosis.  No other acute events have been noted throughout the course of this admission and he is in stable condition for discharge.  Discharge Diagnoses:  Active Problems:   Oropharyngeal cancer (HCC)   Cancer of mouth (HCC)   COPD (chronic obstructive pulmonary disease) (HCC)   Depression   Hypertension   Type 2 diabetes mellitus (HCC)   Primary cancer of left upper lobe of lung (HCC)   Failure to thrive in adult   Pressure injury of skin   Protein-calorie malnutrition, severe   Metastatic cancer to liver [facility])  Discharge diagnosis: Failure to thrive in the setting of oropharyngeal cancer with metastatic disease.  Discharge Instructions  Discharge Instructions    Diet - low sodium heart healthy   Complete by: As directed    Increase activity slowly   Complete by: As directed    No dressing needed   Complete by: As directed    No wound care   Complete by: As directed      Allergies as of 03/14/2021   No Known Allergies     Medication List    STOP taking these medications   canagliflozin 100 MG Tabs tablet Commonly known as: INVOKANA   fenofibrate 160 MG tablet   lisinopril 10 MG tablet Commonly known as: ZESTRIL   metFORMIN 1000 MG tablet Commonly known as: GLUCOPHAGE   omeprazole 20 MG capsule Commonly known as: PRILOSEC   pravastatin 10 MG tablet Commonly known as: PRAVACHOL     TAKE these medications   albuterol 108 (90 Base) MCG/ACT  inhaler Commonly known as: VENTOLIN HFA Inhale 1 puff into the lungs every 6 (six) hours as needed for wheezing or shortness of breath.   baclofen 10 MG tablet Commonly known as: LIORESAL Take 10 mg by mouth at bedtime.   DULoxetine 30 MG capsule Commonly known as: CYMBALTA Take 30 mg by mouth at bedtime.   EYE VITAMINS & MINERALS PO Take 1  tablet by mouth daily.   feeding supplement (GLUCERNA 1.5 CAL) Liqd Give 2 cartons at 8am, noon and 4pm and 1 carton at 8pm via feeding tube (total 7 cartons daily).  Flush with water before and water after.   gabapentin 800 MG tablet Commonly known as: NEURONTIN Take 800 mg by mouth 4 (four) times daily.   lidocaine-prilocaine cream Commonly known as: EMLA Apply a small amount to port a cath site and cover with plastic wrap one hour prior to chemotherapy appointments   magic mouthwash w/lidocaine Soln Take 15 mLs by mouth 3 (three) times daily as needed for mouth pain.   Mitigare 0.6 MG Caps Generic drug: Colchicine Take 0.6 mg by mouth 2 (two) times daily.   naloxone 2 MG/2ML injection Commonly known as: NARCAN Place 1 mg into the nose as needed (for opiod overdose).   nystatin 100000 UNIT/ML suspension Commonly known as: MYCOSTATIN Take 5 mLs (500,000 Units total) by mouth 4 (four) times daily. Swish for 5 minutes.   oxyCODONE 15 MG immediate release tablet Commonly known as: ROXICODONE Take 15 mg by mouth every 4 (four) hours as needed for pain.   pantoprazole 40 MG tablet Commonly known as: PROTONIX Take 40 mg by mouth daily.   Xtampza ER 9 MG C12a Generic drug: oxyCODONE ER Take 9 mg by mouth every 12 (twelve) hours.            Discharge Care Instructions  (From admission, onward)         Start     Ordered   03/14/21 0000  No dressing needed        03/14/21 1113          Follow-up Information    Lance Peri, MD .   Specialty: Internal Medicine Contact information: [address] [place] [address] [phone]              No Known Allergies  Consultations:  Dietitian  Palliative care  Oncology   Procedures/Studies: CT Chest W Contrast  Result Date: 02/21/2021 CLINICAL DATA:  Abnormal x-ray.  Lung nodule. EXAM: CT CHEST WITH CONTRAST TECHNIQUE: Multidetector CT imaging of the chest was performed during intravenous  contrast administration. CONTRAST:  75mL OMNIPAQUE IOHEXOL 300 MG/ML  SOLN COMPARISON:  March 19, 2020 FINDINGS: Cardiovascular: Normal heart size. No pericardial effusion. Mild aneurysmal dilation of the ascending aorta measuring 4 cm in cross-section. Calcific atherosclerotic disease of the aorta and coronary arteries. Mediastinum/Nodes: Right pericardial lymph node measuring 1.6 cm in short axis. Mild diffuse thickening of the esophagus. The major airways are patent. Lungs/Pleura: Too numerous to count bilateral ground-glass, solid and mixed attenuation nodules with lower lobe predominance. Some of the lesions demonstrate cavitations. He dominant such nodule in the posterior left lower lobe measures 2.7 cm. A dominant solid nodule in the right lower lobe measures 2.7 cm. No evidence of airspace consolidation pleural effusion or pneumothorax. Upper Abdomen: Numerous, some confluent hypoattenuated masses throughout the visualized portion of the liver highly suspicious for metastatic disease. Individual lesions measure up to 3 cm. Bilateral adrenal masses, the larger on  the left measuring 3.7 cm. Musculoskeletal: Fracture of posterior right T11 rib, likely pathologic. Lytic lesions within the T4 vertebral body, T6 vertebral body, and T8 vertebral body, likely metastatic. Other more subtle lytic lesions within thoracic vertebral bodies. Some of the lesions demonstrate perispinal soft tissue component. IMPRESSION: 1. Too numerous to count bilateral ground-glass, solid and mixed attenuation pulmonary nodules with lower lobe predominance. Some of the lesions demonstrate cavitations. These findings are most consistent with metastatic disease. 2. Numerous, some confluent hypoattenuated masses throughout the visualized portion of the liver highly suspicious for metastatic disease. 3. Bilateral adrenal masses, the larger on the left measuring 3.7 cm, also consistent with metastatic disease. 4. Lytic lesions within the T4,  T6 and T8 vertebral bodies, likely metastatic. Some of the lesions demonstrate perispinal soft tissue component. 5. Fracture of posterior right T11 rib, likely pathologic. 6. Mild aneurysmal dilation of the ascending aorta measuring 4 cm in cross-section. 7. Calcific atherosclerotic disease of the aorta and coronary arteries. Aortic Atherosclerosis (ICD10-I70.0). Electronically Signed   By: Ted Mcalpine M.D.   On: 02/05/2021 20:40   DG Chest Port 1 View  Result Date: 03/04/2021 CLINICAL DATA:  Failure to thrive, weakness, history of squamous cell carcinoma of the floor of the mouth EXAM: PORTABLE CHEST 1 VIEW COMPARISON:  05/16/2019, 03/19/2020 . FINDINGS: Single frontal view of the chest demonstrates a stable right chest wall port. Cardiac silhouette is unremarkable. Nodular bibasilar airspace disease greatest in the left lower lobe, which could reflect pneumonia or progressive malignancy. Spiculated area left upper lobe seen on prior PET scan overlying the left posterior sixth rib does not appear grossly changed. No effusion or pneumothorax. No acute bony abnormalities. Peg tube overlies left upper quadrant. IMPRESSION: 1. Bibasilar nodular airspace disease greatest in the left lower lobe, which may reflect pneumonia or progressive malignancy. 2. Stable spiculated density within the left upper lobe unchanged. This area was equivocal for malignancy on prior PET scan. 3. The metabolically active left upper lobe pulmonary nodule seen on prior PET scan is not readily apparent by radiograph. Electronically Signed   By: Sharlet Salina M.D.   On: 02/25/2021 16:57      Discharge Exam: Vitals:   03/13/21 2024 03/14/21 0255  BP: (!) 144/111 (!) 158/84  Pulse: (!) 109 (!) 41  Resp: 18 18  Temp: 98.7 F (37.1 C) (!) 97.1 F (36.2 C)  SpO2: (!) 86% 94%   Vitals:   03/13/21 0902 03/13/21 2024 03/14/21 0255 03/14/21 0500  BP: 121/71 (!) 144/111 (!) 158/84   Pulse: (!) 102 (!) 109 (!) 41   Resp:  18  18   Temp:  98.7 F (37.1 C) (!) 97.1 F (36.2 C)   TempSrc:  Oral    SpO2: 91% (!) 86% 94%   Weight:    49.7 kg  Height:        General: Pt is alert, awake, not in acute distress Cardiovascular: RRR, S1/S2 +, no rubs, no gallops Respiratory: CTA bilaterally, no wheezing, no rhonchi Abdominal: Soft, NT, ND, bowel sounds +, PEG tube present, clean dry, and intact Extremities: no edema, no cyanosis    The results of significant diagnostics from this hospitalization (including imaging, microbiology, ancillary and laboratory) are listed below for reference.     Microbiology: Recent Results (from the past 240 hour(s))  Resp Panel by RT-PCR (Flu A&B, Covid) Nasopharyngeal Swab     Status: None   Collection Time: 02/17/2021  4:30 PM   Specimen: Nasopharyngeal Swab;  Nasopharyngeal(NP) swabs in vial transport medium  Result Value Ref Range Status   SARS Coronavirus 2 by RT PCR NEGATIVE NEGATIVE Final    Comment: (NOTE) SARS-CoV-2 target nucleic acids are NOT DETECTED.  The SARS-CoV-2 RNA is generally detectable in upper respiratory specimens during the acute phase of infection. The lowest concentration of SARS-CoV-2 viral copies this assay can detect is 138 copies/mL. A negative result does not preclude SARS-Cov-2 infection and should not be used as the sole basis for treatment or other patient management decisions. A negative result may occur with  improper specimen collection/handling, submission of specimen other than nasopharyngeal swab, presence of viral mutation(s) within the areas targeted by this assay, and inadequate number of viral copies(<138 copies/mL). A negative result must be combined with clinical observations, patient history, and epidemiological information. The expected result is Negative.  Fact Sheet for Patients:  BloggerCourse.com  Fact Sheet for Healthcare Providers:  SeriousBroker.it  This test is no t yet  approved or cleared by the [place] FDA and  has been authorized for detection and/or diagnosis of SARS-CoV-2 by FDA under an Emergency Use Authorization (EUA). This EUA will remain  in effect (meaning this test can be used) for the duration of the COVID-19 declaration under Section 564(b)(1) of the Act, 21 U.S.C.section 360bbb-3(b)(1), unless the authorization is terminated  or revoked sooner.       Influenza A by PCR NEGATIVE NEGATIVE Final   Influenza B by PCR NEGATIVE NEGATIVE Final    Comment: (NOTE) The Xpert Xpress SARS-CoV-2/FLU/RSV plus assay is intended as an aid in the diagnosis of influenza from Nasopharyngeal swab specimens and should not be used as a sole basis for treatment. Nasal washings and aspirates are unacceptable for Xpert Xpress SARS-CoV-2/FLU/RSV testing.  Fact Sheet for Patients: BloggerCourse.com  Fact Sheet for Healthcare Providers: SeriousBroker.it  This test is not yet approved or cleared by the [place] FDA and has been authorized for detection and/or diagnosis of SARS-CoV-2 by FDA under an Emergency Use Authorization (EUA). This EUA will remain in effect (meaning this test can be used) for the duration of the COVID-19 declaration under Section 564(b)(1) of the Act, 21 U.S.C. section 360bbb-3(b)(1), unless the authorization is terminated or revoked.  Performed at [facility], [address], [place], [place] [address]   Blood culture (routine x 2)     Status: None   Collection Time: 02/11/2021  6:39 PM   Specimen: BLOOD LEFT HAND  Result Value Ref Range Status   Specimen Description   Final    BLOOD LEFT HAND Performed at [facility], [address] [place], [place] [address]    Special Requests   Final    BOTTLES DRAWN AEROBIC AND ANAEROBIC Blood Culture adequate volume Performed at [facility], [address], [place], [place] [address]    Culture   Final    NO  GROWTH 5 DAYS Performed at [facility], [address] [place], [place] [address]    Report Status 03/12/2021 FINAL  Final  Blood culture (routine x 2)     Status: None   Collection Time: 02/27/2021  6:42 PM   Specimen: BLOOD LEFT HAND  Result Value Ref Range Status   Specimen Description   Final    BLOOD LEFT HAND Performed at [facility], [address], [place], [place] [address]    Special Requests   Final    BOTTLES DRAWN AEROBIC ONLY Blood Culture adequate volume Performed at [facility],  [address], [place], [place] [address]    Culture   Final    NO GROWTH 5 DAYS Performed at [facility], [address] [place], [place] [address]    Report Status 03/12/2021 FINAL  Final     Labs: BNP (last 3 results) No results for input(s): BNP in the last 8760 hours. Basic Metabolic Panel: Recent Labs  Lab 03/13/2021 1630 03/08/21 0437 03/09/21 0543 03/11/21 0452  NA 141 142 141 143  K 3.6 3.3* 3.6 3.3*  CL 103 106 108 108  CO2 28 26 26 26   GLUCOSE 148* 84 116* 148*  BUN 28* 21 23 20   CREATININE 0.57* 0.40* 0.36* 0.33*  CALCIUM 9.2 9.0 8.9 8.6*  MG  --  1.7 1.6* 1.6*  PHOS  --  2.4* 2.8 1.8*   Liver Function Tests: Recent Labs  Lab 2021-03-13 1630 03/08/21 0437 03/09/21 0543  AST 40 29 31  ALT 33 28 30  ALKPHOS 166* 143* 161*  BILITOT 0.7 0.7 0.5  PROT 5.7* 5.4* 5.1*  ALBUMIN 2.6* 2.5* 2.4*   No results for input(s): LIPASE, AMYLASE in the last 168 hours. No results for input(s): AMMONIA in the last 168 hours. CBC: Recent Labs  Lab 2021/03/13 1630 03/08/21 0437 03/11/21 0452  WBC 20.0* 18.9* 18.7*  NEUTROABS 18.7*  --   --   HGB 11.0* 10.7* 10.1*  HCT 34.9* 33.5* 33.4*  MCV 94.1 93.6 96.5  PLT 402* 389 327   Cardiac Enzymes: No results for input(s): CKTOTAL, CKMB, CKMBINDEX, TROPONINI in the last 168 hours. BNP: Invalid input(s): POCBNP CBG: Recent Labs  Lab 03/13/21 1120 03/13/21 1619 03/13/21 2019 03/14/21 0757 03/14/21 1104   GLUCAP 198* 163* 102* 145* 176*   D-Dimer No results for input(s): DDIMER in the last 72 hours. Hgb A1c No results for input(s): HGBA1C in the last 72 hours. Lipid Profile No results for input(s): CHOL, HDL, LDLCALC, TRIG, CHOLHDL, LDLDIRECT in the last 72 hours. Thyroid function studies No results for input(s): TSH, T4TOTAL, T3FREE, THYROIDAB in the last 72 hours.  Invalid input(s): FREET3 Anemia work up No results for input(s): VITAMINB12, FOLATE, FERRITIN, TIBC, IRON, RETICCTPCT in the last 72 hours. Urinalysis    Component Value Date/Time   COLORURINE YELLOW 03-13-21 1612   APPEARANCEUR CLEAR March 13, 2021 1612   LABSPEC 1.024 March 13, 2021 1612   PHURINE 6.0 03-13-2021 1612   GLUCOSEU >=500 (A) 03/13/21 1612   HGBUR NEGATIVE 03/13/2021 1612   BILIRUBINUR NEGATIVE March 13, 2021 1612   KETONESUR 5 (A) 03/13/21 1612   PROTEINUR NEGATIVE 03-13-2021 1612   NITRITE NEGATIVE 2021/03/13 1612   LEUKOCYTESUR NEGATIVE 03/13/21 1612   Sepsis Labs Invalid input(s): PROCALCITONIN,  WBC,  LACTICIDVEN Microbiology Recent Results (from the past 240 hour(s))  Resp Panel by RT-PCR (Flu A&B, Covid) Nasopharyngeal Swab     Status: None   Collection Time: 03-13-21  4:30 PM   Specimen: Nasopharyngeal Swab; Nasopharyngeal(NP) swabs in vial transport medium  Result Value Ref Range Status   SARS Coronavirus 2 by RT PCR NEGATIVE NEGATIVE Final    Comment: (NOTE) SARS-CoV-2 target nucleic acids are NOT DETECTED.  The SARS-CoV-2 RNA is generally detectable in upper respiratory specimens during the acute phase of infection. The lowest concentration of SARS-CoV-2 viral copies this assay can detect is 138 copies/mL. A negative result does not preclude SARS-Cov-2 infection and should not be used as the sole basis for treatment or other patient management decisions. A negative result may occur with  improper specimen collection/handling, submission  of specimen other than nasopharyngeal swab,  presence of viral mutation(s) within the areas targeted by this assay, and inadequate number of viral copies(<138 copies/mL). A negative result must be combined with clinical observations, patient history, and epidemiological information. The expected result is Negative.  Fact Sheet for Patients:  BloggerCourse.com  Fact Sheet for Healthcare Providers:  SeriousBroker.it  This test is no t yet approved or cleared by the [place] FDA and  has been authorized for detection and/or diagnosis of SARS-CoV-2 by FDA under an Emergency Use Authorization (EUA). This EUA will remain  in effect (meaning this test can be used) for the duration of the COVID-19 declaration under Section 564(b)(1) of the Act, 21 U.S.C.section 360bbb-3(b)(1), unless the authorization is terminated  or revoked sooner.       Influenza A by PCR NEGATIVE NEGATIVE Final   Influenza B by PCR NEGATIVE NEGATIVE Final    Comment: (NOTE) The Xpert Xpress SARS-CoV-2/FLU/RSV plus assay is intended as an aid in the diagnosis of influenza from Nasopharyngeal swab specimens and should not be used as a sole basis for treatment. Nasal washings and aspirates are unacceptable for Xpert Xpress SARS-CoV-2/FLU/RSV testing.  Fact Sheet for Patients: BloggerCourse.com  Fact Sheet for Healthcare Providers: SeriousBroker.it  This test is not yet approved or cleared by the [place] FDA and has been authorized for detection and/or diagnosis of SARS-CoV-2 by FDA under an Emergency Use Authorization (EUA). This EUA will remain in effect (meaning this test can be used) for the duration of the COVID-19 declaration under Section 564(b)(1) of the Act, 21 U.S.C. section 360bbb-3(b)(1), unless the authorization is terminated or revoked.  Performed at [facility], [address], [place], [place] [address]   Blood culture (routine x  2)     Status: None   Collection Time: Mar 14, 2021  6:39 PM   Specimen: BLOOD LEFT HAND  Result Value Ref Range Status   Specimen Description   Final    BLOOD LEFT HAND Performed at [facility], [address] [place], [place] [address]    Special Requests   Final    BOTTLES DRAWN AEROBIC AND ANAEROBIC Blood Culture adequate volume Performed at [facility], [address] [place], [place] [address]    Culture   Final    NO GROWTH 5 DAYS Performed at [facility], [address] [place], [place] [address]    Report Status 03/12/2021 FINAL  Final  Blood culture (routine x 2)     Status: None   Collection Time: Mar 14, 2021  6:42 PM   Specimen: BLOOD LEFT HAND  Result Value Ref Range Status   Specimen Description   Final    BLOOD LEFT HAND Performed at [facility], [address] [place], [place] [address]    Special Requests   Final    BOTTLES DRAWN AEROBIC ONLY Blood Culture adequate volume Performed at [facility], [address], [place], [place] [address]    Culture   Final    NO GROWTH 5 DAYS Performed at [facility], [address] [place], [place] [address]    Report Status 03/12/2021 FINAL  Final     Time coordinating discharge: 35 minutes  SIGNED:   Erick Blinks, DO [facility] 03/14/2021, 11:14 AM  If 7PM-7AM, please contact night-coverage [email or web address]

## 2021-03-14 NOTE — Progress Notes (Incomplete)
Patient continues to take oxygen off.

## 2021-03-14 NOTE — Progress Notes (Signed)
**Note De-Identified  Obfuscation** RRT assessment: patient suctioned in the back of throat to remove small amount of thick tan secretions; patient didn't tolerate well due to pain.  SAT 91% on 6L [place]

## 2021-03-14 NOTE — TOC Transition Note (Addendum)
Transition of Care [facility]) - CM/SW Discharge Note   Patient Details  Name: Lance [name] MRN: [id number] Date of Birth: [birth year]  Transition of Care [facility]) CM/SW Contact:  Boneta Lucks, RN Phone Number: 03/14/2021, 1:47 PM   Clinical Narrative:  Hospice confirmed that equipment has be delivered. Orders placed of Osmolite and a bottle given until Hospice order comes tomorrow.  RN has patient ready for Discharge EMS has been scheduled. Patient is going to his brother's house, [place], [place].   Addendum:  Patient sat are now 88% requiring 10L of oxygen. MD cancelling discharge for today. TOC cancelled EMS, updated Hospice and [name]- sister.   Final next level of care: Home w Hospice Care Barriers to Discharge: Barriers Resolved   Patient Goals and CMS Choice Patient states their goals for this hospitalization and ongoing recovery are:: to go home. CMS Medicare.gov Compare Post Acute Care list provided to:: Patient Represenative (must comment) Choice offered to / list presented to : Sibling  Discharge Placement              Patient chooses bed at:  [facility]) Patient to be transferred to facility by: EMS Name of family member notified: [name] Patient and family notified of of transfer: 03/14/21  Discharge Plan and Services     Readmission Risk Interventions Readmission Risk Prevention Plan 03/14/2021 03/12/2021  Transportation Screening Complete Complete  PCP or Specialist Appt within 5-7 Days Complete -  Home Care Screening Complete -  Medication Review (RN CM) Complete -  HRI or [place] - Complete  Social Work Consult for [place] Planning/Counseling - Complete  Palliative Care Screening - Complete  Medication Review Press photographer) - Complete  Some recent data might be hidden

## 2021-03-14 NOTE — Progress Notes (Addendum)
   03/14/21 1420  Assess: MEWS Score  BP (!) 127/92  Pulse Rate (!) 117  SpO2 (!) 88 %  O2 Device HFNC  O2 Flow Rate (L/min) 10 L/min  Assess: MEWS Score  MEWS Temp 0  MEWS Systolic 0  MEWS Pulse 2  MEWS RR 0  MEWS LOC 0  MEWS Score 2  MEWS Score Color Yellow  Assess: if the MEWS score is Yellow or Red  Were vital signs taken at a resting state? Yes  Focused Assessment Change from prior assessment (see assessment flowsheet)  Early Detection of Sepsis Score *See Row Information* Low  MEWS guidelines implemented *See Row Information* Yes (Pt going home on hospice, Pt is DNR,)  Treat  MEWS Interventions Other (Comment)  Take Vital Signs  Increase Vital Sign Frequency  Yellow: Q 2hr X 2 then Q 4hr X 2, if remains yellow, continue Q 4hrs  Escalate  MEWS: Escalate Yellow: discuss with charge nurse/RN and consider discussing with provider and RRT  Notify: Charge Nurse/RN  Name of Charge Nurse/RN Notified Erick Alley RN  Date Charge Nurse/RN Notified 03/14/21  Time Charge Nurse/RN Notified [id number]  Notify: Provider  Provider Name/Title Illene Bolus MD  Date Provider Notified 03/14/21  Time Provider Notified 1425  Notification Type Page  Notification Reason Change in status  Provider response See new orders  Date of Provider Response 03/14/21  Time of Provider Response 1427

## 2021-03-14 NOTE — Progress Notes (Signed)
   03/14/21 1420  Assess: MEWS Score  BP (!) 127/92  Pulse Rate (!) 117  SpO2 (!) 88 %  O2 Device HFNC  O2 Flow Rate (L/min) 10 L/min  Assess: MEWS Score  MEWS Temp 0  MEWS Systolic 0  MEWS Pulse 2  MEWS RR 0  MEWS LOC 0  MEWS Score 2  MEWS Score Color Yellow  Assess: if the MEWS score is Yellow or Red  Were vital signs taken at a resting state? Yes  Focused Assessment Change from prior assessment (see assessment flowsheet)  Early Detection of Sepsis Score *See Row Information* Low  MEWS guidelines implemented *See Row Information* No, other (Comment) (Pt going home on hospice, Pt is DNR,)  Treat  MEWS Interventions Other (Comment)  Take Vital Signs  Increase Vital Sign Frequency  Yellow: Q 2hr X 2 then Q 4hr X 2, if remains yellow, continue Q 4hrs  Escalate  MEWS: Escalate Yellow: discuss with charge nurse/RN and consider discussing with provider and RRT  Notify: Charge Nurse/RN  Name of Charge Nurse/RN Notified Erick Alley RN  Date Charge Nurse/RN Notified 03/14/21  Time Charge Nurse/RN Notified [id number]  Notify: Provider  Provider Name/Title Illene Bolus MD  Date Provider Notified 03/14/21  Time Provider Notified 1425  Notification Type Page  Notification Reason Change in status  Provider response See new orders  Date of Provider Response 03/14/21  Time of Provider Response 1427   MEWS updated.Hospice pt respiratory notified. Chest Xray and ABG ordered.

## 2021-03-14 NOTE — Care Management Important Message (Deleted)
Important Message  Patient Details  Name: Lance [name] MRN: [id number] Date of Birth: December 03, 1951   Medicare Important Message Given:  Yes     Tommy Medal 03/14/2021, 11:42 AM

## 2021-04-07 NOTE — Death Summary Note (Signed)
DEATH SUMMARY   Patient Details  Name: Lance [name] MRN: [id number] DOB: November 01, 1951  Admission/Discharge Information   Admit Date:  03-25-21  Date of Death: Date of Death: 04-02-21  Time of Death: Time of Death: 0130  Length of Stay: 7  Referring Physician: [name], MD   Reason(s) for Hospitalization  Failure to thrive  Diagnoses  Preliminary cause of death:  Secondary Diagnoses (including complications and co-morbidities):  Active Problems:   Oropharyngeal cancer (HCC)   Cancer of mouth (HCC)   COPD (chronic obstructive pulmonary disease) (HCC)   Depression   Hypertension   Type 2 diabetes mellitus (HCC)   Primary cancer of left upper lobe of lung (HCC)   Failure to thrive in adult   Pressure injury of skin   Protein-calorie malnutrition, severe   Metastatic cancer to liver [facility])   Brief Hospital Course (including significant findings, care, treatment, and services provided and events leading to death)  Lance [name] is a 70 y.o. 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, and non-small cell lung cancer, status post chemo and radiation therapy, most recently patient was seen by his oncologist in April 2021, patient failed to show up on his follow-up appointment(despite receiving multiple voicemail as noted on epic),patient has PEG tube, apparently his caregiver (girlfriend)passed away last September 15, 2023, patient was brought by her sister for failure to thrive, patient reports has been having cough for few days, has been feeling dyspneic as well, reports he is having very poor oral intake, usually he is on pured diet, drinking little fluids as well, he has a PEG tube, but reports he has not used itfor a while, he denies fever, chills, chest pain. - in EDwork-up was significant for albumin of 2.6, white blood cell count of 20K,for widely metastatic disease with multiple bilateral nodules, liver metastasis, abdominal  metastasis, and lytic lesions in vertebral bodies,[facility] hospitalist consulted to admit.  -Patient was admitted with failure to thrive in the setting of oropharyngeal cancer.  He was noted not to be a candidate for any further aggressive treatment or intervention per oncology service and was noted to have some dysphagia and oral thrush which was being treated with nystatin and Diflucan.  He continued to maintain hydration and nutrition through his PEG tube and has now been set up for home hospice given his failure to thrive and poor long-term prognosis.  No other acute events have been noted throughout the course of this admission until the afternoon of 4/7 when he was noted to have worsening hypoxemia likely related to aspiration. His discharge to home hospice was cancelled and he was monitored overnight. Unfortunately, he continued to have high oxygen requirements and ultimately expired at 0130 on 2023/04/03.    Pertinent Labs and Studies  Significant Diagnostic Studies DG Chest 1 View  Result Date: 03/14/2021 CLINICAL DATA:  Hypoxemia. EXAM: CHEST  1 VIEW COMPARISON:  03/25/21. FINDINGS: The heart size and mediastinal contours are within normal limits. No pneumothorax or pleural effusion is noted. Interval development of multiple patchy airspace opacities diffusely throughout the lungs bilaterally most consistent with multifocal pneumonia. Right subclavian catheter is unchanged in position. The visualized skeletal structures are unremarkable. IMPRESSION: Interval development of multiple bilateral patchy airspace opacities most consistent with multifocal pneumonia. Electronically Signed   By: Lupita Raider M.D.   On: 03/14/2021 16:40   CT Chest W Contrast  Result Date: 2021/03/25 CLINICAL DATA:  Abnormal  x-ray.  Lung nodule. EXAM: CT CHEST WITH CONTRAST TECHNIQUE: Multidetector CT imaging of the chest was performed during intravenous contrast administration. CONTRAST:  75mL OMNIPAQUE IOHEXOL 300  MG/ML  SOLN COMPARISON:  March 19, 2020 FINDINGS: Cardiovascular: Normal heart size. No pericardial effusion. Mild aneurysmal dilation of the ascending aorta measuring 4 cm in cross-section. Calcific atherosclerotic disease of the aorta and coronary arteries. Mediastinum/Nodes: Right pericardial lymph node measuring 1.6 cm in short axis. Mild diffuse thickening of the esophagus. The major airways are patent. Lungs/Pleura: Too numerous to count bilateral ground-glass, solid and mixed attenuation nodules with lower lobe predominance. Some of the lesions demonstrate cavitations. He dominant such nodule in the posterior left lower lobe measures 2.7 cm. A dominant solid nodule in the right lower lobe measures 2.7 cm. No evidence of airspace consolidation pleural effusion or pneumothorax. Upper Abdomen: Numerous, some confluent hypoattenuated masses throughout the visualized portion of the liver highly suspicious for metastatic disease. Individual lesions measure up to 3 cm. Bilateral adrenal masses, the larger on the left measuring 3.7 cm. Musculoskeletal: Fracture of posterior right T11 rib, likely pathologic. Lytic lesions within the T4 vertebral body, T6 vertebral body, and T8 vertebral body, likely metastatic. Other more subtle lytic lesions within thoracic vertebral bodies. Some of the lesions demonstrate perispinal soft tissue component. IMPRESSION: 1. Too numerous to count bilateral ground-glass, solid and mixed attenuation pulmonary nodules with lower lobe predominance. Some of the lesions demonstrate cavitations. These findings are most consistent with metastatic disease. 2. Numerous, some confluent hypoattenuated masses throughout the visualized portion of the liver highly suspicious for metastatic disease. 3. Bilateral adrenal masses, the larger on the left measuring 3.7 cm, also consistent with metastatic disease. 4. Lytic lesions within the T4, T6 and T8 vertebral bodies, likely metastatic. Some of the  lesions demonstrate perispinal soft tissue component. 5. Fracture of posterior right T11 rib, likely pathologic. 6. Mild aneurysmal dilation of the ascending aorta measuring 4 cm in cross-section. 7. Calcific atherosclerotic disease of the aorta and coronary arteries. Aortic Atherosclerosis (ICD10-I70.0). Electronically Signed   By: Ted Mcalpine M.D.   On: 2021/03/30 20:40   DG Chest Port 1 View  Result Date: 03-30-21 CLINICAL DATA:  Failure to thrive, weakness, history of squamous cell carcinoma of the floor of the mouth EXAM: PORTABLE CHEST 1 VIEW COMPARISON:  05/16/2019, 03/19/2020 . FINDINGS: Single frontal view of the chest demonstrates a stable right chest wall port. Cardiac silhouette is unremarkable. Nodular bibasilar airspace disease greatest in the left lower lobe, which could reflect pneumonia or progressive malignancy. Spiculated area left upper lobe seen on prior PET scan overlying the left posterior sixth rib does not appear grossly changed. No effusion or pneumothorax. No acute bony abnormalities. Peg tube overlies left upper quadrant. IMPRESSION: 1. Bibasilar nodular airspace disease greatest in the left lower lobe, which may reflect pneumonia or progressive malignancy. 2. Stable spiculated density within the left upper lobe unchanged. This area was equivocal for malignancy on prior PET scan. 3. The metabolically active left upper lobe pulmonary nodule seen on prior PET scan is not readily apparent by radiograph. Electronically Signed   By: Sharlet Salina M.D.   On: Mar 30, 2021 16:57    Microbiology Recent Results (from the past 240 hour(s))  Resp Panel by RT-PCR (Flu A&B, Covid) Nasopharyngeal Swab     Status: None   Collection Time: 30-Mar-2021  4:30 PM   Specimen: Nasopharyngeal Swab; Nasopharyngeal(NP) swabs in vial transport medium  Result Value Ref Range Status   SARS  Coronavirus 2 by RT PCR NEGATIVE NEGATIVE Final    Comment: (NOTE) SARS-CoV-2 target nucleic acids are NOT  DETECTED.  The SARS-CoV-2 RNA is generally detectable in upper respiratory specimens during the acute phase of infection. The lowest concentration of SARS-CoV-2 viral copies this assay can detect is 138 copies/mL. A negative result does not preclude SARS-Cov-2 infection and should not be used as the sole basis for treatment or other patient management decisions. A negative result may occur with  improper specimen collection/handling, submission of specimen other than nasopharyngeal swab, presence of viral mutation(s) within the areas targeted by this assay, and inadequate number of viral copies(<138 copies/mL). A negative result must be combined with clinical observations, patient history, and epidemiological information. The expected result is Negative.  Fact Sheet for Patients:  BloggerCourse.com  Fact Sheet for Healthcare Providers:  SeriousBroker.it  This test is no t yet approved or cleared by the [place] FDA and  has been authorized for detection and/or diagnosis of SARS-CoV-2 by FDA under an Emergency Use Authorization (EUA). This EUA will remain  in effect (meaning this test can be used) for the duration of the COVID-19 declaration under Section 564(b)(1) of the Act, 21 U.S.C.section 360bbb-3(b)(1), unless the authorization is terminated  or revoked sooner.       Influenza A by PCR NEGATIVE NEGATIVE Final   Influenza B by PCR NEGATIVE NEGATIVE Final    Comment: (NOTE) The Xpert Xpress SARS-CoV-2/FLU/RSV plus assay is intended as an aid in the diagnosis of influenza from Nasopharyngeal swab specimens and should not be used as a sole basis for treatment. Nasal washings and aspirates are unacceptable for Xpert Xpress SARS-CoV-2/FLU/RSV testing.  Fact Sheet for Patients: BloggerCourse.com  Fact Sheet for Healthcare Providers: SeriousBroker.it  This test is not yet  approved or cleared by the [place] FDA and has been authorized for detection and/or diagnosis of SARS-CoV-2 by FDA under an Emergency Use Authorization (EUA). This EUA will remain in effect (meaning this test can be used) for the duration of the COVID-19 declaration under Section 564(b)(1) of the Act, 21 U.S.C. section 360bbb-3(b)(1), unless the authorization is terminated or revoked.  Performed at [facility], [address], [place], [place] [address]   Blood culture (routine x 2)     Status: None   Collection Time: 03/22/21  6:39 PM   Specimen: BLOOD LEFT HAND  Result Value Ref Range Status   Specimen Description   Final    BLOOD LEFT HAND Performed at [facility], [address], [place], [place] [address]    Special Requests   Final    BOTTLES DRAWN AEROBIC AND ANAEROBIC Blood Culture adequate volume Performed at [facility], [address] [place], [place] [address]    Culture   Final    NO GROWTH 5 DAYS Performed at [facility], [address], [place], [place] [address]    Report Status 03/12/2021 FINAL  Final  Blood culture (routine x 2)     Status: None   Collection Time: 03/22/2021  6:42 PM   Specimen: BLOOD LEFT HAND  Result Value Ref Range Status   Specimen Description   Final    BLOOD LEFT HAND Performed at [facility], [address] [place], [place] [address]    Special Requests   Final    BOTTLES DRAWN AEROBIC ONLY Blood Culture adequate volume Performed at [facility], [address], [place], [place] [address]    Culture   Final  NO GROWTH 5 DAYS Performed at [facility], [address] [place], [place] [address]    Report Status 03/12/2021 FINAL  Final    Lab Basic Metabolic Panel: Recent Labs  Lab 03/09/21 0543 03/11/21 0452  NA 141 143  K 3.6 3.3*  CL 108 108  CO2 26 26  GLUCOSE 116* 148*  BUN 23 20  CREATININE 0.36* 0.33*  CALCIUM 8.9 8.6*  MG 1.6* 1.6*  PHOS 2.8 1.8*   Liver Function  Tests: Recent Labs  Lab 03/09/21 0543  AST 31  ALT 30  ALKPHOS 161*  BILITOT 0.5  PROT 5.1*  ALBUMIN 2.4*   No results for input(s): LIPASE, AMYLASE in the last 168 hours. No results for input(s): AMMONIA in the last 168 hours. CBC: Recent Labs  Lab 03/11/21 0452  WBC 18.7*  HGB 10.1*  HCT 33.4*  MCV 96.5  PLT 327   Cardiac Enzymes: No results for input(s): CKTOTAL, CKMB, CKMBINDEX, TROPONINI in the last 168 hours. Sepsis Labs: Recent Labs  Lab 03/11/21 0452  WBC 18.7*    Procedures/Operations  None   Efrem Pitstick D Samanatha Brammer 2021-04-05, 9:25 AM

## 2021-04-07 NOTE — Progress Notes (Signed)
Tried calling patient's sister 3 times, left VM to call back regarding patient. Sister just called back and I let her know he had passed away. She requested we contact [facility] in [place], [place]

## 2021-04-07 DEATH — deceased
